# Patient Record
Sex: Male | Born: 1950
Health system: Southern US, Community
[De-identification: ages and names within clinical notes are randomized; demographics above are authoritative.]

## PROBLEM LIST (undated history)

## (undated) DIAGNOSIS — I10 Essential (primary) hypertension: Secondary | ICD-10-CM

## (undated) DIAGNOSIS — K222 Esophageal obstruction: Secondary | ICD-10-CM

## (undated) DIAGNOSIS — C4491 Basal cell carcinoma of skin, unspecified: Secondary | ICD-10-CM

## (undated) DIAGNOSIS — K635 Polyp of colon: Secondary | ICD-10-CM

## (undated) DIAGNOSIS — T7840XA Allergy, unspecified, initial encounter: Secondary | ICD-10-CM

## (undated) DIAGNOSIS — R972 Elevated prostate specific antigen [PSA]: Secondary | ICD-10-CM

## (undated) DIAGNOSIS — K5790 Diverticulosis of intestine, part unspecified, without perforation or abscess without bleeding: Secondary | ICD-10-CM

## (undated) DIAGNOSIS — R011 Cardiac murmur, unspecified: Secondary | ICD-10-CM

## (undated) DIAGNOSIS — K219 Gastro-esophageal reflux disease without esophagitis: Secondary | ICD-10-CM

## (undated) DIAGNOSIS — S92009A Unspecified fracture of unspecified calcaneus, initial encounter for closed fracture: Secondary | ICD-10-CM

## (undated) DIAGNOSIS — K648 Other hemorrhoids: Secondary | ICD-10-CM

## (undated) HISTORY — DX: Allergy, unspecified, initial encounter: T78.40XA

## (undated) HISTORY — DX: Basal cell carcinoma of skin, unspecified: C44.91

## (undated) HISTORY — PX: COLONOSCOPY: SHX174

## (undated) HISTORY — DX: Esophageal obstruction: K22.2

## (undated) HISTORY — DX: Elevated prostate specific antigen (PSA): R97.20

## (undated) HISTORY — PX: KNEE ARTHROSCOPY: SUR90

## (undated) HISTORY — PX: SHOULDER ARTHROSCOPY: SHX128

## (undated) HISTORY — DX: Gastro-esophageal reflux disease without esophagitis: K21.9

## (undated) HISTORY — DX: Cardiac murmur, unspecified: R01.1

## (undated) HISTORY — DX: Polyp of colon: K63.5

## (undated) HISTORY — DX: Essential (primary) hypertension: I10

## (undated) HISTORY — DX: Other hemorrhoids: K64.8

## (undated) HISTORY — DX: Unspecified fracture of unspecified calcaneus, initial encounter for closed fracture: S92.009A

## (undated) HISTORY — PX: LIPOMA EXCISION: SHX5283

## (undated) HISTORY — DX: Diverticulosis of intestine, part unspecified, without perforation or abscess without bleeding: K57.90

## (undated) HISTORY — PX: OTHER SURGICAL HISTORY: SHX169

---

## 1999-09-07 ENCOUNTER — Encounter: Payer: Self-pay | Admitting: Emergency Medicine

## 1999-09-07 ENCOUNTER — Emergency Department (HOSPITAL_COMMUNITY): Admission: EM | Admit: 1999-09-07 | Discharge: 1999-09-07 | Payer: Self-pay | Admitting: *Deleted

## 2000-11-23 DIAGNOSIS — D229 Melanocytic nevi, unspecified: Secondary | ICD-10-CM

## 2000-11-23 HISTORY — DX: Melanocytic nevi, unspecified: D22.9

## 2001-10-21 ENCOUNTER — Encounter: Payer: Self-pay | Admitting: General Surgery

## 2001-10-21 ENCOUNTER — Observation Stay (HOSPITAL_COMMUNITY): Admission: RE | Admit: 2001-10-21 | Discharge: 2001-10-22 | Payer: Self-pay | Admitting: General Surgery

## 2001-10-21 ENCOUNTER — Encounter (INDEPENDENT_AMBULATORY_CARE_PROVIDER_SITE_OTHER): Payer: Self-pay | Admitting: Specialist

## 2001-10-21 HISTORY — PX: CHOLECYSTECTOMY: SHX55

## 2004-08-08 ENCOUNTER — Ambulatory Visit (HOSPITAL_BASED_OUTPATIENT_CLINIC_OR_DEPARTMENT_OTHER): Admission: RE | Admit: 2004-08-08 | Discharge: 2004-08-08 | Payer: Self-pay | Admitting: Orthopedic Surgery

## 2006-06-23 HISTORY — PX: NASAL POLYP SURGERY: SHX186

## 2006-07-24 ENCOUNTER — Emergency Department (HOSPITAL_COMMUNITY): Admission: EM | Admit: 2006-07-24 | Discharge: 2006-07-24 | Payer: Self-pay | Admitting: Emergency Medicine

## 2007-11-22 DIAGNOSIS — K5790 Diverticulosis of intestine, part unspecified, without perforation or abscess without bleeding: Secondary | ICD-10-CM

## 2007-11-22 DIAGNOSIS — K648 Other hemorrhoids: Secondary | ICD-10-CM | POA: Insufficient documentation

## 2007-11-22 HISTORY — DX: Diverticulosis of intestine, part unspecified, without perforation or abscess without bleeding: K57.90

## 2007-11-22 HISTORY — DX: Other hemorrhoids: K64.8

## 2008-11-21 ENCOUNTER — Encounter: Admission: RE | Admit: 2008-11-21 | Discharge: 2008-11-21 | Payer: Self-pay | Admitting: Otolaryngology

## 2009-03-23 ENCOUNTER — Ambulatory Visit (HOSPITAL_COMMUNITY): Admission: RE | Admit: 2009-03-23 | Discharge: 2009-03-23 | Payer: Self-pay | Admitting: Otolaryngology

## 2009-06-12 ENCOUNTER — Encounter: Admission: RE | Admit: 2009-06-12 | Discharge: 2009-06-12 | Payer: Self-pay | Admitting: Otolaryngology

## 2009-10-24 DIAGNOSIS — C4491 Basal cell carcinoma of skin, unspecified: Secondary | ICD-10-CM

## 2009-10-24 HISTORY — DX: Basal cell carcinoma of skin, unspecified: C44.91

## 2010-06-23 HISTORY — PX: ESOPHAGOGASTRODUODENOSCOPY: SHX1529

## 2010-07-14 ENCOUNTER — Encounter: Payer: Self-pay | Admitting: Otolaryngology

## 2010-10-16 ENCOUNTER — Encounter (INDEPENDENT_AMBULATORY_CARE_PROVIDER_SITE_OTHER): Payer: BC Managed Care – PPO | Admitting: Family Medicine

## 2010-10-16 DIAGNOSIS — I10 Essential (primary) hypertension: Secondary | ICD-10-CM

## 2010-10-16 DIAGNOSIS — Z125 Encounter for screening for malignant neoplasm of prostate: Secondary | ICD-10-CM

## 2010-10-16 DIAGNOSIS — Z79899 Other long term (current) drug therapy: Secondary | ICD-10-CM

## 2010-10-16 DIAGNOSIS — Z0000 Encounter for general adult medical examination without abnormal findings: Secondary | ICD-10-CM

## 2010-10-18 ENCOUNTER — Encounter: Payer: Self-pay | Admitting: Family Medicine

## 2010-11-08 NOTE — Op Note (Signed)
[facility]  Patient:    Erik Leon, Erik Leon Visit Number: [id number] MRN: [id number]          Service Type: SUR Location: 1W 0160 01 Attending Physician:  Carson Myrtle Dictated by:   Sheppard Plumber Earlene Plater, M.D. Proc. Date: 10/21/01 Admit Date:  10/21/2001 Discharge Date: 10/22/2001   CC:         Griffith Citron, M.D.   Operative Report  PREOPERATIVE DIAGNOSIS:  Subacute and chronic cholecystolithiasis.  POSTOPERATIVE DIAGNOSIS:  Subacute and chronic cholecystolithiasis.  PROCEDURE:  Laparoscopic cholecystectomy and operative cholangiogram.  SURGEON:  Timothy E. Earlene Plater, M.D.  ANESTHESIA:  CRNA supervised, Dr. Seward Grater.  INDICATION:  Mr. Hoon otherwise healthy, has over the past month developed severe mid epigastric and right upper quadrant pain with radiation, nausea. He has had no change in liver function studies.  Ultrasound confirmed stones and acute and chronic changes in the gallbladder.  He was counseled and urgently scheduled for this surgery.  DESCRIPTION OF PROCEDURE:  The patient was evaluated by anesthesia, taken to the operating room, placed supine, general endotracheal anesthesia administered.  The abdomen was shaved, scrubbed, prepped, and draped in the usual fashion.  Marcaine 0.25% with epinephrine was used prior to each incision.  A horizontal infraumbilical incision made, the fascia identified, opened vertically, the peritoneum entered without complication.  The Hasson catheter sewn in place with a #1 Vicryl.  The abdomen insufflated.  General peritoneoscopy was unremarkable.  The gallbladder was thickened and densely adherent to the omentum.  It was approximately one-half intrahepatic.  A second 10 mm trocar placed in the mid epigastrium, two 5 mm trocars in the right upper quadrant.  The gallbladder was grasped and the adhesions taken down, the entire gallbladder then visualized, carefully inspected.  An anterior artery  was identified, clipped, and divided.  The cystic duct beneath that was then carefully dissected out, a clip placed near the gallbladder, small incision made in the cystic duct, a Reddick catheter inserted, clipped in place and then using real-time fluoroscopy with half-strength Hypaque, injections were made showing smooth and complete filling of the biliary tree with free flow into the duodenum.  The catheter removed, the stump of the cystic duct doubly clipped, and then fully divided.  A small posterior artery was identified and clipped as well.  The gallbladder was removed from the gallbladder bed without incident or complication.  Careful inspection of the gallbladder bed and copiously irrigation revealed no bleeding and no bile. The gallbladder was then removed through the infraumbilical incision with a small amount of spillage of bile and removal.  The gallbladder was inspected; stones were palpated, and it was passed off for pathology.  Copious irrigation was used throughout the abdomen.  When the irrigant was clear after two liters of irrigation, that was complete as well.  All irrigants, CO2, instruments, and trocars removed under direct vision.  Counts were correct.  Small skin bleeders were cauterized, and the wounds were closed with 3-0 Monocryl. Steri-Strips applied, second counts correct, dry sterile dressings applied, and he was removed to recovery room in good condition. Dictated by:   Sheppard Plumber Earlene Plater, M.D. Attending Physician:  Carson Myrtle DD:  10/21/01 TD:  10/22/01 Job: [id number]

## 2010-11-08 NOTE — Op Note (Signed)
Erik Leon, Erik Leon               ACCOUNT NO.:  [id number]   MEDICAL RECORD NO.:  [id number]          PATIENT TYPE:  AMB   LOCATION:  DSC                          FACILITY:  [facility]   PHYSICIAN:  Loreta Ave, M.D. DATE OF BIRTH:  [birth year]   DATE OF PROCEDURE:  08/08/2004  DATE OF DISCHARGE:  08/08/2004                                 OPERATIVE REPORT   PREOPERATIVE DIAGNOSIS:  Chronic impingement and distal clavicle osteolysis,  left shoulder.   POSTOPERATIVE DIAGNOSIS:  Chronic impingement and distal clavicle  osteolysis, left shoulder; with anteroinferior labrum tear.   PROCEDURE:  Left shoulder examination under anesthesia, arthroscopy and  debridement of labrum and rotator cuff.  Bursectomy, acromioplasty, CA  ligament release.  Excision of distal clavicle.   SURGEON:  Loreta Ave, M.D.   ASSISTANT:  Genene Churn. Denton Meek.   ANESTHESIA:  General.   ESTIMATED BLOOD LOSS:  Minimal.   SPECIMENS:  None.   CULTURES:  None.   COMPLICATIONS:  None.   DRESSING:  Soft compressive, with sling.   DESCRIPTION OF PROCEDURE:  The patient brought to the operating room and  placed on the operating room table in supine position.  After adequate  anesthesia had been obtained, the left shoulder examined.  Full motion  disability.  Placed in the beach chair position on a shoulder positioner.  Prepped and draped in the usual sterile fashion.   Three portals created anterior, posterior and lateral.  Shoulder with blunt  obturator, scope introduced, joint distended and inspected.  Articular  cartilage intact.  Complex flap tear.  Anteroinferior labrum debrided to a  stable surface.  Biceps tendon and biceps anchor, capsular ligament  structures, undersurface of the cuff all normal.   After debridement of the labrum, subacromial space is accessed.  Reactive  bursitis and some mild abrasion on top of the cuff.  Type 2 acromion.  Bursa  resected.  Cuff debrided.  Acromioplasty to  top of acromion and released CA  ligament.  Distal clavicle grade 4 changes with spurring.  Periarticular  spurs and lateral cm of clavicle resected.  Adequate CMD compression and  clavicle excision confirmed viewing from all portals.  Cuff thoroughly  assessed and intact.   The instruments and fluid were removed.  Portals, shoulder and bursa  injected with Marcaine.  Portals closed with 4-0 nylon.  Sterile compressive  dressing applied.  Anesthesia reversed.  Brought to recovery room.  Tolerated the surgery well with no complications.      DFM/MEDQ  D:  08/09/2004  T:  08/10/2004  Job:  [id number]

## 2011-10-11 ENCOUNTER — Other Ambulatory Visit: Payer: Self-pay | Admitting: Family Medicine

## 2011-10-14 ENCOUNTER — Telehealth: Payer: Self-pay | Admitting: Internal Medicine

## 2011-10-14 MED ORDER — LISINOPRIL 40 MG PO TABS: 40.0000 mg | ORAL_TABLET | Freq: Every day | ORAL | Status: DC

## 2011-10-14 NOTE — Telephone Encounter (Signed)
Pt is coming in for a med check and needs a 30 day supply to get him by until his appt

## 2011-10-22 ENCOUNTER — Encounter: Payer: Self-pay | Admitting: Family Medicine

## 2011-10-22 ENCOUNTER — Telehealth: Payer: Self-pay | Admitting: *Deleted

## 2011-10-22 ENCOUNTER — Ambulatory Visit (INDEPENDENT_AMBULATORY_CARE_PROVIDER_SITE_OTHER): Payer: BC Managed Care – PPO | Admitting: Family Medicine

## 2011-10-22 VITALS — BP 120/72 | HR 72 | Ht 69.5 in | Wt 212.0 lb

## 2011-10-22 DIAGNOSIS — I421 Obstructive hypertrophic cardiomyopathy: Secondary | ICD-10-CM

## 2011-10-22 DIAGNOSIS — Z125 Encounter for screening for malignant neoplasm of prostate: Secondary | ICD-10-CM

## 2011-10-22 DIAGNOSIS — Z23 Encounter for immunization: Secondary | ICD-10-CM

## 2011-10-22 DIAGNOSIS — K219 Gastro-esophageal reflux disease without esophagitis: Secondary | ICD-10-CM

## 2011-10-22 DIAGNOSIS — I10 Essential (primary) hypertension: Secondary | ICD-10-CM

## 2011-10-22 DIAGNOSIS — R011 Cardiac murmur, unspecified: Secondary | ICD-10-CM

## 2011-10-22 DIAGNOSIS — E785 Hyperlipidemia, unspecified: Secondary | ICD-10-CM

## 2011-10-22 DIAGNOSIS — R002 Palpitations: Secondary | ICD-10-CM

## 2011-10-22 MED ORDER — LISINOPRIL 40 MG PO TABS: 40.0000 mg | ORAL_TABLET | Freq: Every day | ORAL | Status: DC

## 2011-10-22 NOTE — Telephone Encounter (Signed)
Spoke with patient about the Zostavax he was supposed to get today and he told me that he is coming in next week on Tues 10/28/11 @ 9:15 for his labwork and he would like to get it then, I added it to the schedule. Just an FYI.

## 2011-10-22 NOTE — Patient Instructions (Signed)
We are going to arrange for an echocardiogram. Continue your current medications. Return for fasting labs.

## 2011-10-22 NOTE — Progress Notes (Signed)
Patient presents for med check, last seen at [facility] over a year ago.  He denies any specific complaints today.  Having some recurrent problems with esophagus, considering re-dilatation soon (sees Dr. Kinnie Scales). Not getting exercise other than walking on the job, sometimes for up to 30 minutes straight.  HTN: BP's at home run 110/72.  Denies dizziness, headaches, leg swelling, chest pain.  Updated past medical and surgical history, history completely reviewed.  He had norovirus 3 weeks ago--only had diarrhea for a short time, but vomited frequently x 2-3 days, lost 11 pounds.  No problems since.   Past Medical History  Diagnosis Date  . Allergy   . Heart murmur   . GERD (gastroesophageal reflux disease)   . Hypertension   . Colon polyp   . Internal hemorrhoid 6/09  . Diverticulosis 6/09    rare L colon diverticuli  . Hypertrophic obstructive cardiomyopathy     mild--Dr. Mayford Knife  . Migraine age 24    resolved after nasal surgery  . Esophageal stricture     s/p dilatation (Dr. Kinnie Scales)    Past Surgical History  Procedure Date  . Colonoscopy 11/2007, 2011    Dr. Laural Roes again 2014  . Shoulder arthroscopy 12/03; 1/06    right; left  . Cholecystectomy 5/03  . Nasal polyp surgery 2008  . Lipoma excision     Dr. Kateri Mc from L paramedian prevertebral space  . Esophagogastroduodenoscopy 2012    with dilatation.  Dr. Kinnie Scales    History   Social History  . Marital Status: Married    Spouse Name: N/A    Number of Children: N/A  . Years of Education: N/A   Occupational History  . CEO of many companies    Social History Main Topics  . Smoking status: Former Smoker    Quit date: 06/24/1983  . Smokeless tobacco: Not on file  . Alcohol Use: Yes     1 beer a month.  . Drug Use: No  . Sexually Active: Not on file   Other Topics Concern  . Not on file   Social History Narrative   Married with a son and a daughter (due to get re-married soon).  Adopted 3 children (ages  80-18)    Family History  Problem Relation Age of Onset  . Hypertension Mother    Current Outpatient Prescriptions on File Prior to Visit  Medication Sig Dispense Refill  . aspirin 81 MG tablet Take 81 mg by mouth daily.        Marland Kitchen esomeprazole (NEXIUM) 40 MG capsule Take 40 mg by mouth daily before breakfast.        . fexofenadine (ALLEGRA) 180 MG tablet Take 180 mg by mouth daily.       No Known Allergies  ROS: Denies headaches, dizziness, chest pain.  Occasionally feels fluttering in chest.  Can't recall details, lasts a few seconds.  Not related to exertion or activity, possibly related to caffeine.  Denies any exertional pain, shortness of breath, denies syncope, tachycardia. Occasional mild dysphagia. Denies bowel changes. No skin rashes.  Denies URI symptoms  PHYSICAL EXAM: BP 120/72  Pulse 72  Ht 5' 9.5" (1.765 m)  Wt 212 lb (96.163 kg)  BMI 30.86 kg/m2 Well developed, pleasant male in no distress Neck: no lymphadenopathy, thyromegaly or carotid bruit Heart: regular rate and rhythm.  Blowing murmur at apex, also heard at RUSB, systolic ejection Lungs: clear bilaterally Abdomen: soft, nontender, no organomegaly or mass Extremities: no clubbing, cyanosis or edema,  2+ pulses Skin: no rash Neuro: alert and oriented. Cranial nerves grossly intact.  Normal gait, strength, sensation Psych: normal mood, affect, hygiene and grooming  ASSESSMENT/PLAN: 1. Essential hypertension, benign  Comprehensive metabolic panel, lisinopril (PRINIVIL,ZESTRIL) 40 MG tablet   well controlled  2. GERD (gastroesophageal reflux disease)    3. Need for shingles vaccine     risks and benefits reviewed.  Pt willing to get without checking insurance coverage. Left prior to getting--will administer at [place] when he returns for labs  4. Dyslipidemia  Lipid panel  5. Palpitations    6. Heart murmur    7. Hypertrophic obstructive cardiomyopathy     mild, per old records, as previously diagnosed by Dr.  Mayford Knife. Recommend repeat echo. BP well controlled, and pt asymptomatic  8. Special screening for malignant neoplasm of prostate  PSA   Refer for Echo  Palpitations--check echo, per history, sounds benign. If increased frequency, consider monitor. Cut back on caffeine

## 2011-10-28 ENCOUNTER — Other Ambulatory Visit: Payer: BC Managed Care – PPO

## 2011-10-29 ENCOUNTER — Other Ambulatory Visit (INDEPENDENT_AMBULATORY_CARE_PROVIDER_SITE_OTHER): Payer: BC Managed Care – PPO

## 2011-10-29 DIAGNOSIS — Z23 Encounter for immunization: Secondary | ICD-10-CM

## 2011-10-29 DIAGNOSIS — Z2911 Encounter for prophylactic immunotherapy for respiratory syncytial virus (RSV): Secondary | ICD-10-CM

## 2011-10-29 DIAGNOSIS — E785 Hyperlipidemia, unspecified: Secondary | ICD-10-CM

## 2011-10-29 DIAGNOSIS — Z125 Encounter for screening for malignant neoplasm of prostate: Secondary | ICD-10-CM

## 2011-10-29 DIAGNOSIS — I10 Essential (primary) hypertension: Secondary | ICD-10-CM

## 2011-10-30 ENCOUNTER — Encounter: Payer: Self-pay | Admitting: Family Medicine

## 2011-10-30 LAB — LIPID PANEL
Cholesterol: 149 mg/dL (ref 0–200)
LDL Cholesterol: 91 mg/dL (ref 0–99)
VLDL: 19 mg/dL (ref 0–40)

## 2011-10-30 LAB — COMPREHENSIVE METABOLIC PANEL
ALT: 14 U/L (ref 0–53)
AST: 20 U/L (ref 0–37)
Albumin: 4.2 g/dL (ref 3.5–5.2)
Calcium: 9.5 mg/dL (ref 8.4–10.5)
Chloride: 105 mEq/L (ref 96–112)
Potassium: 5.2 mEq/L (ref 3.5–5.3)

## 2011-11-20 ENCOUNTER — Encounter: Payer: Self-pay | Admitting: Family Medicine

## 2012-06-23 DIAGNOSIS — S92009A Unspecified fracture of unspecified calcaneus, initial encounter for closed fracture: Secondary | ICD-10-CM | POA: Insufficient documentation

## 2012-06-23 HISTORY — DX: Unspecified fracture of unspecified calcaneus, initial encounter for closed fracture: S92.009A

## 2012-09-14 ENCOUNTER — Other Ambulatory Visit: Payer: Self-pay | Admitting: Dermatology

## 2012-09-14 DIAGNOSIS — C4491 Basal cell carcinoma of skin, unspecified: Secondary | ICD-10-CM

## 2012-09-14 HISTORY — DX: Basal cell carcinoma of skin, unspecified: C44.91

## 2012-09-23 ENCOUNTER — Telehealth: Payer: Self-pay | Admitting: Family Medicine

## 2012-09-23 NOTE — Telephone Encounter (Signed)
[facility] req Lisinopril 40 mg  1 po qd

## 2012-09-24 NOTE — Telephone Encounter (Signed)
And was denied

## 2012-09-24 NOTE — Telephone Encounter (Signed)
Faxed back that pt needs an appt

## 2012-10-21 DIAGNOSIS — R972 Elevated prostate specific antigen [PSA]: Secondary | ICD-10-CM

## 2012-10-21 HISTORY — DX: Elevated prostate specific antigen (PSA): R97.20

## 2012-11-01 ENCOUNTER — Other Ambulatory Visit: Payer: Self-pay | Admitting: Family Medicine

## 2012-11-01 ENCOUNTER — Ambulatory Visit (INDEPENDENT_AMBULATORY_CARE_PROVIDER_SITE_OTHER): Payer: BC Managed Care – PPO | Admitting: Family Medicine

## 2012-11-01 ENCOUNTER — Encounter: Payer: Self-pay | Admitting: Family Medicine

## 2012-11-01 VITALS — BP 130/78 | HR 68 | Ht 70.0 in | Wt 211.0 lb

## 2012-11-01 DIAGNOSIS — Z125 Encounter for screening for malignant neoplasm of prostate: Secondary | ICD-10-CM

## 2012-11-01 DIAGNOSIS — I10 Essential (primary) hypertension: Secondary | ICD-10-CM

## 2012-11-01 LAB — COMPREHENSIVE METABOLIC PANEL
ALT: 19 U/L (ref 0–53)
AST: 20 U/L (ref 0–37)
Alkaline Phosphatase: 55 U/L (ref 39–117)
CO2: 30 mEq/L (ref 19–32)
Creat: 1 mg/dL (ref 0.50–1.35)
Sodium: 141 mEq/L (ref 135–145)
Total Bilirubin: 0.6 mg/dL (ref 0.3–1.2)
Total Protein: 6.4 g/dL (ref 6.0–8.3)

## 2012-11-01 MED ORDER — LISINOPRIL 40 MG PO TABS: 40.0000 mg | ORAL_TABLET | Freq: Every day | ORAL | Status: DC

## 2012-11-01 NOTE — Progress Notes (Signed)
Chief Complaint  Patient presents with  . Hypertension    med check, nonfasting.   Patient presents for follow up on hypertension.  He was last seen 1 year ago, and denies any current problems.  He checks his BP about weekly, and has been running 115-125/75-85 when taking meds.  He has been out of his medication since Friday.  Denies headaches, dizziness, edema, side effects of medication. Denies chest pain. Walks a lot with his job, at [employer], fields.  Some shortness of breath with walking up and down hills/steep inclines.  Unchanged x years. He always feels like there is something wrong with his heart, but he has seen cardiologist and was told everything was fine. Has had normal stress tests in the past (Dr. Mayford Knife)  Past Medical History  Diagnosis Date  . Allergy   . Heart murmur   . GERD (gastroesophageal reflux disease)   . Hypertension   . Colon polyp   . Internal hemorrhoid 6/09  . Diverticulosis 6/09    rare L colon diverticuli  . Hypertrophic obstructive cardiomyopathy     mild--Dr. Mayford Knife  . Migraine age 62    resolved after nasal surgery  . Esophageal stricture     s/p dilatation (Dr. Kinnie Scales)  . Calcaneal fracture 2014    right; s/p boot   Past Surgical History  Procedure Laterality Date  . Colonoscopy  11/2007, 2011    Dr. Laural Roes again 2014  . Shoulder arthroscopy  12/03; 1/06    right; left  . Cholecystectomy  5/03  . Nasal polyp surgery  2008  . Lipoma excision      Dr. Kateri Mc from L paramedian prevertebral space  . Esophagogastroduodenoscopy  2012    with dilatation.  Dr. Kinnie Scales   History   Social History  . Marital Status: Married    Spouse Name: N/A    Number of Children: N/A  . Years of Education: N/A   Occupational History  . CEO of many companies    Social History Main Topics  . Smoking status: Former Smoker    Quit date: 06/24/1983  . Smokeless tobacco: Not on file  . Alcohol Use: Yes     Comment: 1 beer a month.  .  Drug Use: No  . Sexually Active: Not on file   Other Topics Concern  . Not on file   Social History Narrative   Recently separated from his wife.  They have a son and a daughter.  Adopted 3 children (ages 60-18)   Family History  Problem Relation Age of Onset  . Hypertension Mother   . Dementia Mother    Current outpatient prescriptions:aspirin 81 MG tablet, Take 81 mg by mouth daily.  , Disp: , Rfl: ;  esomeprazole (NEXIUM) 40 MG capsule, Take 40 mg by mouth daily before breakfast.  , Disp: , Rfl: ;  fexofenadine (ALLEGRA) 180 MG tablet, Take 180 mg by mouth daily., Disp: , Rfl: ;  lisinopril (PRINIVIL,ZESTRIL) 40 MG tablet, Take 1 tablet (40 mg total) by mouth daily., Disp: 90 tablet, Rfl: 3  No Known Allergies  ROS:  Denies fevers, URI symptoms, cough, chest pain, nausea, vomiting, dysphagia, bowel changes, bleeding/bruising, rashes, depression/anxiety, joint pains or other concerns.  Recent injury to his heel, with some ongoing occasional foot pain.  He had gained weight while in the boot, but he is back to being active, playing tennis and lost the weight.  PHYSICAL EXAM: BP 150/90  Pulse 68  Ht 5\' 10"  (  1.778 m)  Wt 211 lb (95.709 kg)  BMI 30.28 kg/m2 130 78 on repeat by MD  Well developed, pleasant male in no distress HEENT:  PERRL, conjunctiva clear Neck: no lymphadenopathy, thyromegaly or carotid bruit Heart: regular rate and rhythm without murmur Lungs: clear bilaterally Chest: nontender, no mass Abdomen: soft, nontender, no organomegaly or mass Extremities: no edema, 2+ pulse Skin: no rash Psych: normal mood, affect, hygiene and grooming Neuro: alert and oriented.  Normal strength, sensation, gait  ASSESSMENT/PLAN:  Essential hypertension, benign - Plan: lisinopril (PRINIVIL,ZESTRIL) 40 MG tablet, Comprehensive metabolic panel  Special screening for malignant neoplasm of prostate - Plan: PSA  HTN--well controlled.  A little higher than normal today due to being  out of meds x 3 days.  (nonfasting--had coffee with cream an hour ago)  Schedule CPE

## 2012-11-01 NOTE — Patient Instructions (Signed)
Check with your insurance re: zostavax coverage. We can give this shingles vaccine at your physical

## 2012-11-02 LAB — PSA, TOTAL AND FREE: PSA, Free Pct: 17 % — ABNORMAL LOW (ref >25)

## 2012-11-03 ENCOUNTER — Encounter: Payer: Self-pay | Admitting: Family Medicine

## 2012-11-03 DIAGNOSIS — R972 Elevated prostate specific antigen [PSA]: Secondary | ICD-10-CM | POA: Insufficient documentation

## 2013-03-03 ENCOUNTER — Telehealth: Payer: Self-pay | Admitting: *Deleted

## 2013-03-03 MED ORDER — AZITHROMYCIN 250 MG PO TABS: ORAL_TABLET | ORAL | Status: DC

## 2013-03-03 NOTE — Telephone Encounter (Signed)
Patient informed of Dr.Knapp's recommendations. 

## 2013-03-03 NOTE — Telephone Encounter (Signed)
Spoke with patient and he has has symptoms x 2-3 days. Running nose, cough that is deep in his chest, producing a greenish-yellow mucus. Had a fever of 100.5 last night. The only OTC medication he has been taking is tylenol. He is leaving the country tomorrow morning @ 10:45am. Would like to have medications to take with him. Uses [facility] [address]

## 2013-03-03 NOTE — Telephone Encounter (Signed)
I do NOT recommend starting antibiotics yet--this very likely is viral.  Continue with supportive measures--use tylenol and/or ibuprofen for pain and fever, sinus rinses and mucinex for chest congestion and cough, delsym syrup (vs Mucinex DM) for cough.  Okay for z-pak so he can take with him.  Not to start until fevers have persisted, or illness has persisted >5-7 days and worsening.  He may use Afrin nasal spray on the plane to help with ear pain.  Only if BP's are very well controlled should he use a decongestant.

## 2013-03-04 ENCOUNTER — Ambulatory Visit (INDEPENDENT_AMBULATORY_CARE_PROVIDER_SITE_OTHER): Payer: BC Managed Care – PPO | Admitting: Medical

## 2013-03-04 ENCOUNTER — Encounter: Payer: Self-pay | Admitting: Medical

## 2013-03-04 VITALS — BP 142/90 | HR 84 | Temp 97.7°F | Resp 18 | Wt 215.0 lb

## 2013-03-04 DIAGNOSIS — R059 Cough, unspecified: Secondary | ICD-10-CM

## 2013-03-04 DIAGNOSIS — J069 Acute upper respiratory infection, unspecified: Secondary | ICD-10-CM

## 2013-03-04 DIAGNOSIS — R509 Fever, unspecified: Secondary | ICD-10-CM

## 2013-03-04 DIAGNOSIS — R05 Cough: Secondary | ICD-10-CM

## 2013-03-04 NOTE — Progress Notes (Signed)
Subjective:  Erik Leon is a 62 y.o. male who presents for illness.  Started Tuesday not feeling good with runny nose, post nasal drip, achy.  But over last 2 days been having low grade fever, continue symptoms, and now cough, thicker productive mucous, sinus pressure.  Yesterday had fever to 102.2.  He called in here and zpak was called out.   He is on day 2 of zpak . He missed his flight today to [place] due to fever and illness.  Needs letter for work/airline.  He is currently taking mucinex DM.  No other aggravating or relieving factors.  No other c/o.  ROS as in subjective.   Objective: Filed Vitals:   03/04/13 1156  BP: 142/90  Pulse: 84  Temp: 97.7 F (36.5 C)  Resp: 18    General appearance: Alert, WD/WN, no distress, somewhat ill appearing                             Skin: warm, no rash                           Head: no sinus tenderness                            Eyes: conjunctiva normal, corneas clear, PERRLA                            Ears: pearly TMs, external ear canals normal                          Nose: septum midline, turbinates with erythema and clear discharge             Mouth/throat: MMM, tongue normal, mild pharyngeal erythema                           Neck: supple, no adenopathy, no thyromegaly, nontender                          Heart: RRR, normal S1, S2, no murmurs                         Lungs: CTA bilaterally, no wheezes, rales, or rhonchi     Assessment: Encounter Diagnoses  Name Primary?  . Cough Yes  . Fever, unspecified   . Upper respiratory infection     Plan: No obvious pneumonia, lungs seem clear.  discussed his symptoms.  Advised he finish Zpak which he has begun, c/t Mucinex DM, increase water intake, use nasal saline flush and salt water gargles, Tylenol or Ibuprofen OTC for fever and malaise.  Call/return in 2-3 days if symptoms aren't resolving.  Gave letter for plane/work due to his absence and inability to take the plane  trip.

## 2013-03-21 ENCOUNTER — Encounter: Payer: Self-pay | Admitting: Family Medicine

## 2013-03-21 ENCOUNTER — Ambulatory Visit (INDEPENDENT_AMBULATORY_CARE_PROVIDER_SITE_OTHER): Payer: BC Managed Care – PPO | Admitting: Family Medicine

## 2013-03-21 VITALS — BP 120/84 | HR 68 | Temp 98.0°F | Ht 69.5 in | Wt 213.0 lb

## 2013-03-21 DIAGNOSIS — Z0000 Encounter for general adult medical examination without abnormal findings: Secondary | ICD-10-CM

## 2013-03-21 DIAGNOSIS — I10 Essential (primary) hypertension: Secondary | ICD-10-CM

## 2013-03-21 DIAGNOSIS — K219 Gastro-esophageal reflux disease without esophagitis: Secondary | ICD-10-CM

## 2013-03-21 DIAGNOSIS — R972 Elevated prostate specific antigen [PSA]: Secondary | ICD-10-CM

## 2013-03-21 LAB — POCT URINALYSIS DIPSTICK
Bilirubin, UA: NEGATIVE
Blood, UA: NEGATIVE
Nitrite, UA: NEGATIVE
Protein, UA: NEGATIVE
Spec Grav, UA: 1.015
Urobilinogen, UA: NEGATIVE
pH, UA: 5

## 2013-03-21 NOTE — Patient Instructions (Signed)
HEALTH MAINTENANCE RECOMMENDATIONS:  It is recommended that you get at least 30 minutes of aerobic exercise at least 5 days/week (for weight loss, you may need as much as 60-90 minutes). This can be any activity that gets your heart rate up. This can be divided in 10-15 minute intervals if needed, but try and build up your endurance at least once a week.  Weight bearing exercise is also recommended twice weekly.  Eat a healthy diet with lots of vegetables, fruits and fiber.  "Colorful" foods have a lot of vitamins (ie green vegetables, tomatoes, red peppers, etc).  Limit sweet tea, regular sodas and alcoholic beverages, all of which has a lot of calories and sugar.  Up to 2 alcoholic drinks daily may be beneficial for men (unless trying to lose weight, watch sugars).  Drink a lot of water.  Sunscreen of at least SPF 30 should be used on all sun-exposed parts of the skin when outside between the hours of 10 am and 4 pm (not just when at beach or pool, but even with exercise, golf, tennis, and yard work!)  Use a sunscreen that says "broad spectrum" so it covers both UVA and UVB rays, and make sure to reapply every 1-2 hours.  Remember to change the batteries in your smoke detectors when changing your clock times in the spring and fall.  Use your seat belt every time you are in a car, and please drive safely and not be distracted with cell phones and texting while driving.  Please  Consider getting your flu shot upon return to [place], in the near future (you will still get benefit, even if you wait until December or January).

## 2013-03-21 NOTE — Progress Notes (Signed)
Chief Complaint  Patient presents with  . Annual Exam    fasting annual exam. No concerns other than the fact that he still has lingering cough since before he left country.  Pt declines flu vaccine, he doesn not take them.   Erik Leon is a 62 y.o. male who presents for a complete physical.  He has the following concerns:  Hypertension follow-up:  Blood pressures elsewhere are 115/75-120's/80's.  Denies dizziness, headaches, chest pain.  Denies side effects of medications.  Admits to a slight cough from the ACEI, but tolerable.  Very slight dizziness if he gets up too fast.  Recent URI, treated with Zpak.  He is feeling much better, but some residual cough, slightly greenish-brown throughout the day.  Seems to be getting better, coughing less today than even over the weekend.  Fevers resolved.  Abnormal PSA--he had prostate biopsy which was benign.  He is to follow up with urologist for a 6 months check. He denies any urinary symptoms, ED or other GU complaints.  Immunization History  Administered Date(s) Administered  . Tdap 10/16/2010  . Zoster 10/29/2011  refuses flu shots Last colonoscopy: 11/2007, due Last PSA: May 2014, then referred to [facility] and had biopsy in June which was benign Dentist: twice yearly Ophtho: every 2 years Exercise: plays tennis, 3-5 hours per week (doubles).  Walks a lot on his job  Past Medical History  Diagnosis Date  . Allergy   . Heart murmur   . GERD (gastroesophageal reflux disease)   . Hypertension   . Colon polyp   . Internal hemorrhoid 6/09  . Diverticulosis 6/09    rare L colon diverticuli  . Hypertrophic obstructive cardiomyopathy     mild--Dr. Mayford Knife  . Migraine age 65    resolved after nasal surgery  . Esophageal stricture     s/p dilatation (Dr. Kinnie Scales)  . Calcaneal fracture 2014    right; s/p boot  . Elevated PSA 10/2012    benign prostate biopsies 11/2012    Past Surgical History  Procedure Laterality Date  . Colonoscopy   11/2007, 2011    Dr. Laural Roes again 2014  . Shoulder arthroscopy  12/03; 1/06    right; left  . Cholecystectomy  5/03  . Nasal polyp surgery  2008  . Lipoma excision      Dr. Kateri Mc from L paramedian prevertebral space  . Esophagogastroduodenoscopy  2012    with dilatation.  Dr. Kinnie Scales    History   Social History  . Marital Status: Married    Spouse Name: N/A    Number of Children: N/A  . Years of Education: N/A   Occupational History  . CEO of many companies    Social History Main Topics  . Smoking status: Former Smoker    Quit date: 06/24/1983  . Smokeless tobacco: Never Used  . Alcohol Use: Yes     Comment: 1 scotch per week  . Drug Use: No  . Sexual Activity: Yes    Partners: Female   Other Topics Concern  . Not on file   Social History Narrative   Recently separated from his wife.  They have a son and a daughter.  Adopted 3 children (ages 1-18)    Family History  Problem Relation Age of Onset  . Hypertension Mother   . Dementia Mother   . Cancer Mother     bladder, metastatic  . Diabetes Neg Hx   . Prostate cancer Neg Hx   . Colon  cancer Neg Hx     Current outpatient prescriptions:aspirin 81 MG tablet, Take 81 mg by mouth daily.  , Disp: , Rfl: ;  esomeprazole (NEXIUM) 40 MG capsule, Take 40 mg by mouth daily before breakfast.  , Disp: , Rfl: ;  fexofenadine (ALLEGRA) 180 MG tablet, Take 180 mg by mouth daily., Disp: , Rfl: ;  lisinopril (PRINIVIL,ZESTRIL) 40 MG tablet, Take 1 tablet (40 mg total) by mouth daily., Disp: 90 tablet, Rfl: 3  No Known Allergies  ROS:  The patient denies anorexia, fever, weight changes, headaches,  vision loss, decreased hearing, ear pain, hoarseness, chest pain, palpitations, dizziness, syncope, dyspnea on exertion, cough, swelling, nausea, vomiting, diarrhea, constipation, abdominal pain, melena, hematochezia, indigestion/heartburn, hematuria, incontinence, erectile dysfunction, nocturia, weakened urine stream,  dysuria, genital lesions, joint pains, numbness, tingling, weakness, tremor, suspicious skin lesions, depression, anxiety, abnormal bleeding/bruising, or enlarged lymph nodes. Intermittent dysphagia, has to chew food well, and be careful. Sees derm (Dr. Jorja Loa)  regularly (precancers) Some tennis elbow bothering him in the last month. He reports that ridging of fingernails is better since trip to [place] where he drank more juice  PHYSICAL EXAM: BP 120/84  Pulse 68  Temp(Src) 98 F (36.7 C)  Ht 5' 9.5" (1.765 m)  Wt 213 lb (96.616 kg)  BMI 31.01 kg/m2  General Appearance:    Alert, cooperative, no distress, appears stated age  Head:    Normocephalic, without obvious abnormality, atraumatic  Eyes:    PERRL, conjunctiva/corneas clear, EOM's intact, fundi    benign  Ears:    Normal TM's and external ear canals  Nose:   Nares normal, mucosa normal, no drainage or sinus   tenderness  Throat:   Lips, mucosa, and tongue normal; teeth and gums normal  Neck:   Supple, no lymphadenopathy;  thyroid:  no   enlargement/tenderness/nodules; no carotid   bruit or JVD  Back:    Spine nontender, no curvature, ROM normal, no CVA     tenderness  Lungs:     Clear to auscultation bilaterally without wheezes, rales or     ronchi; respirations unlabored  Chest Wall:    No tenderness or deformity   Heart:    Regular rate and rhythm, S1 and S2 normal, no murmur, rub   or gallop  Breast Exam:    No chest wall tenderness, masses or gynecomastia  Abdomen:     Soft, non-tender, nondistended, normoactive bowel sounds,    no masses, no hepatosplenomegaly  Genitalia:    Deferred to urologist.  Rectal:    Deferred to urologist.  Extremities:   No clubbing, cyanosis or edema  Pulses:   2+ and symmetric all extremities  Skin:   Skin color, texture, turgor normal, no rashes or lesions.  Some white discoloration of 3rd toenails bilaterally. Left 3rd toe is smaller than other toes (he gives h/o fracture). Nails aren't  thickened, no ingrown nail, nontender.  Bruising of L great toe from recent injury.  Lymph nodes:   Cervical, supraclavicular, and axillary nodes normal  Neurologic:   CNII-XII intact, normal strength, sensation and gait; reflexes 2+ and symmetric throughout          Psych:   Normal mood, affect, hygiene and grooming.    ASSESSMENT/PLAN:  Routine general medical examination at a health care facility - Plan: Visual acuity screening, POCT Urinalysis Dipstick  GERD (gastroesophageal reflux disease) - followed by Dr. Kinnie Scales; mild dysphagia  Essential hypertension, benign - well controlled  Elevated PSA - follow  up with urology as scheduled   Discussed PSA screening (risks/benefits), of which he is already aware. Recommended at least 30 minutes of aerobic activity at least 5 days/week; proper sunscreen use reviewed; healthy diet and alcohol recommendations (less than or equal to 2 drinks/day) reviewed; regular seatbelt use; changing batteries in smoke detectors. Self-testicular exams. Immunization recommendations discussed. He declines flu shot, may consider in November. Colonoscopy recommendations reviewed--due now

## 2013-04-28 ENCOUNTER — Other Ambulatory Visit: Payer: Self-pay

## 2013-06-02 ENCOUNTER — Telehealth: Payer: Self-pay | Admitting: Internal Medicine

## 2013-06-02 NOTE — Telephone Encounter (Signed)
Faxed over medical records to [facility] @ [phone]

## 2013-06-23 HISTORY — PX: ELBOW SURGERY: SHX618

## 2013-08-02 ENCOUNTER — Encounter: Payer: Self-pay | Admitting: Medical

## 2013-08-02 ENCOUNTER — Ambulatory Visit (INDEPENDENT_AMBULATORY_CARE_PROVIDER_SITE_OTHER): Payer: BC Managed Care – PPO | Admitting: Medical

## 2013-08-02 VITALS — BP 138/80 | HR 82 | Temp 98.1°F | Resp 18 | Wt 222.0 lb

## 2013-08-02 DIAGNOSIS — J209 Acute bronchitis, unspecified: Secondary | ICD-10-CM

## 2013-08-02 DIAGNOSIS — Z832 Family history of diseases of the blood and blood-forming organs and certain disorders involving the immune mechanism: Secondary | ICD-10-CM

## 2013-08-02 MED ORDER — AZITHROMYCIN 250 MG PO TABS: ORAL_TABLET | ORAL | Status: DC

## 2013-08-02 NOTE — Progress Notes (Signed)
   Subjective:   Erik Leon is a 63 y.o. male presenting on 08/02/2013 with Cough  He reports deep cough, coughing keeping him up, having hard time sleeping due to cough x 4-5 days.  He notes some head congestion, mostly chest congestion and symptoms, coughing up phlegm, some SOB after coughing spells, sore throat.  Denies hemoptysis, no fever, no NVD, no ear pain.  Has had sick contacts.  He also recently had right elbow surgery.   He is a nonsmoker.   He had pneumonia in September.  Using nothing for symptoms No other aggravating or relieving factors.  He denies chest pain, palpitations, leg edema, dyspnea on exertion, no recent weight gain. No other complaint.  He wanted to let us know that he found out recently that brother and nephew have been diagnosed with factor V Leiden.   Review of Systems ROS as in subjective      Objective:   Filed Vitals:   08/02/13 1352  BP: 138/80  Pulse: 82  Temp: 98.1 F (36.7 C)  Resp: 18    General appearance: alert, no distress, WD/WN, seated with arm sling on right arm HEENT: normocephalic, sclerae anicteric, TMs pearly, nares with turbinate edema and clear discharge, pharynx normal Oral cavity: MMM, no lesions Neck: supple, no lymphadenopathy, no thyromegaly, no masses, no JVD Lungs: somewhat bronchial sounding, no wheezes, rhonchi, or rales Pulses: 2+ symmetric, upper and lower extremities, normal cap refill Ext: no edema     Assessment: Encounter Diagnoses  Name Primary?  . Acute bronchitis Yes  . Family history of factor V Leiden mutation     Plan:  we will treat him for bronchitis, worsening respiratory infection.  He has pain medication left from his recent surgery, so advised to use that for worse cough as needed. Instructions given as below.  Advised the next time he is and we can check some labs given family history of factor V Leiden.  Patient Instructions  Bronchitis  Begin Zpak antibiotic  Rest  Hydrate well  with water  Use your pain medication every 6 hours as needed for cough  Begin OTC Mucinex DM for cough/congestion  If not improving or worse in the next 4-5 days, then call back      Return if symptoms worsen or fail to improve.

## 2013-08-02 NOTE — Patient Instructions (Signed)
Bronchitis  Begin Zpak antibiotic  Rest  Hydrate well with water  Use your pain medication every 6 hours as needed for cough  Begin OTC Mucinex DM for cough/congestion  If not improving or worse in the next 4-5 days, then call back

## 2013-11-03 ENCOUNTER — Other Ambulatory Visit: Payer: Self-pay | Admitting: Family Medicine

## 2014-02-16 ENCOUNTER — Other Ambulatory Visit: Payer: Self-pay | Admitting: Family Medicine

## 2014-04-20 ENCOUNTER — Ambulatory Visit (INDEPENDENT_AMBULATORY_CARE_PROVIDER_SITE_OTHER): Payer: BC Managed Care – PPO | Admitting: Family Medicine

## 2014-04-20 ENCOUNTER — Encounter: Payer: Self-pay | Admitting: Family Medicine

## 2014-04-20 VITALS — BP 138/86 | HR 76 | Ht 69.0 in | Wt 224.0 lb

## 2014-04-20 DIAGNOSIS — I10 Essential (primary) hypertension: Secondary | ICD-10-CM

## 2014-04-20 DIAGNOSIS — Z86010 Personal history of colonic polyps: Secondary | ICD-10-CM

## 2014-04-20 DIAGNOSIS — K219 Gastro-esophageal reflux disease without esophagitis: Secondary | ICD-10-CM

## 2014-04-20 DIAGNOSIS — Z0000 Encounter for general adult medical examination without abnormal findings: Secondary | ICD-10-CM

## 2014-04-20 DIAGNOSIS — E669 Obesity, unspecified: Secondary | ICD-10-CM

## 2014-04-20 DIAGNOSIS — Z5181 Encounter for therapeutic drug level monitoring: Secondary | ICD-10-CM

## 2014-04-20 LAB — POCT URINALYSIS DIPSTICK
Bilirubin, UA: NEGATIVE
Blood, UA: NEGATIVE
Glucose, UA: NEGATIVE
Ketones, UA: NEGATIVE
Leukocytes, UA: NEGATIVE
NITRITE UA: NEGATIVE
Protein, UA: NEGATIVE
Spec Grav, UA: 1.015
UROBILINOGEN UA: NEGATIVE
pH, UA: 6

## 2014-04-20 LAB — CBC WITH DIFFERENTIAL/PLATELET
BASOS PCT: 1 % (ref 0–1)
Basophils Absolute: 0.1 10*3/uL (ref 0.0–0.1)
Eosinophils Absolute: 0.2 10*3/uL (ref 0.0–0.7)
Eosinophils Relative: 4 % (ref 0–5)
HEMATOCRIT: 41.1 % (ref 39.0–52.0)
Hemoglobin: 13.9 g/dL (ref 13.0–17.0)
LYMPHS PCT: 30 % (ref 12–46)
Lymphs Abs: 1.9 10*3/uL (ref 0.7–4.0)
MCH: 27.1 pg (ref 26.0–34.0)
MCHC: 33.8 g/dL (ref 30.0–36.0)
MCV: 80.1 fL (ref 78.0–100.0)
MONO ABS: 0.6 10*3/uL (ref 0.1–1.0)
Monocytes Relative: 9 % (ref 3–12)
NEUTROS ABS: 3.5 10*3/uL (ref 1.7–7.7)
Neutrophils Relative %: 56 % (ref 43–77)
Platelets: 207 10*3/uL (ref 150–400)
RBC: 5.13 MIL/uL (ref 4.22–5.81)
RDW: 14 % (ref 11.5–15.5)
WBC: 6.2 10*3/uL (ref 4.0–10.5)

## 2014-04-20 MED ORDER — LISINOPRIL 40 MG PO TABS: ORAL_TABLET | ORAL | Status: DC

## 2014-04-20 NOTE — Progress Notes (Signed)
Chief Complaint  Patient presents with  . Annual Exam    annual fasting exam. No concerns. Patient declined flu vaccine today.    Erik Leon is a 63 y.o. male who presents for a complete physical.  He has the following concerns:  Hypertension follow-up: Blood pressures are checked once a week, and run 110-125/75-86. Denies dizziness, headaches, chest pain. Denies side effects of medications. Admits to a slight cough/throat-clearing from the ACEI, but tolerable.   Abnormal PSA--he had prostate biopsy which was benign. He sees urologist. He was last seen in June.  He plans to follow up in December, and then will be seen yearly if okay. He denies any urinary symptoms, ED or other GU complaints.  GERD:  Takes Nexium daily.  He has symptoms if he eats late at night, resolved by elevating the head of his bed.  No recurrent dysphagia.  Immunization History  Administered Date(s) Administered  . Tdap 10/16/2010  . Zoster 10/29/2011   refuses flu shots  Last colonoscopy: last was 2011, due last year, but Dr. Earlean Shawl had been ill.  He is planning for 05/2014 Last PSA: 3.76 in 11/2013 at urologist Dentist: twice yearly  Ophtho: every 2 years, last 2 years ago Exercise: Unable to play tennis since elbow surgery in January. He just resumed exercise.  (he used to play tennis, 3-5 hours per week (doubles)). Walks a lot on his job   Past Medical History  Diagnosis Date  . Allergy   . Heart murmur   . GERD (gastroesophageal reflux disease)   . Hypertension   . Colon polyp   . Internal hemorrhoid 6/09  . Diverticulosis 6/09    rare L colon diverticuli  . Hypertrophic obstructive cardiomyopathy     mild--Dr. Radford Pax (previously)  . Migraine age 77    resolved after nasal surgery  . Esophageal stricture     s/p dilatation (Dr. Earlean Shawl)  . Calcaneal fracture 2014    right; s/p boot  . Elevated PSA 10/2012    benign prostate biopsies 11/2012    Past Surgical History  Procedure Laterality Date   . Colonoscopy  11/2007, 2011    Dr. Rudi Rummage again 2014  . Shoulder arthroscopy  12/03; 1/06    right; left  . Cholecystectomy  5/03  . Nasal polyp surgery  2008  . Lipoma excision      Dr. Stanford Breed from [facility] prevertebral space  . Esophagogastroduodenoscopy  2012    with dilatation.  Dr. Earlean Shawl  . Elbow surgery Right 06/2013    bone spur; Dr. Percell Miller    History   Social History  . Marital Status: Married    Spouse Name: N/A    Number of Children: N/A  . Years of Education: N/A   Occupational History  . CEO of many companies    Social History Main Topics  . Smoking status: Former Smoker    Quit date: 06/24/1983  . Smokeless tobacco: Never Used  . Alcohol Use: Yes     Comment: 1 scotch per week or less  . Drug Use: No  . Sexual Activity: Yes    Partners: Female   Other Topics Concern  . Not on file   Social History Narrative   Separated from his wife.  They have a son and a daughter.  Adopted 3 children. Expecting a granddaughter 06/2014    Family History  Problem Relation Age of Onset  . Hypertension Mother   . Dementia Mother   . Cancer  Mother     bladder, metastatic  . Diabetes Neg Hx   . Prostate cancer Neg Hx   . Colon cancer Neg Hx     Outpatient Encounter Prescriptions as of 04/20/2014  Medication Sig  . aspirin 81 MG tablet Take 81 mg by mouth daily.    Marland Kitchen esomeprazole (NEXIUM) 40 MG capsule Take 40 mg by mouth daily before breakfast.    . fexofenadine (ALLEGRA) 180 MG tablet Take 180 mg by mouth daily.  Marland Kitchen lisinopril (PRINIVIL,ZESTRIL) 40 MG tablet TAKE 1 TABLET BY MOUTH DAILY  . [DISCONTINUED] lisinopril (PRINIVIL,ZESTRIL) 40 MG tablet TAKE 1 TABLET BY MOUTH DAILY  . [DISCONTINUED] azithromycin (ZITHROMAX) 250 MG tablet 2 tablets day 1, then 1 tablet days 2-4  . [DISCONTINUED] lisinopril (PRINIVIL,ZESTRIL) 40 MG tablet TAKE 1 TABLET BY MOUTH DAILY    No Known Allergies  ROS: The patient denies anorexia, fever, headaches, vision  loss, decreased hearing, ear pain, hoarseness, chest pain, palpitations, dizziness, syncope, dyspnea on exertion, cough, swelling, nausea, vomiting, diarrhea, constipation, abdominal pain, melena, hematochezia, hematuria, incontinence, erectile dysfunction, nocturia, weakened urine stream, dysuria, genital lesions, joint pains, numbness, tingling, weakness, tremor, suspicious skin lesions, depression, anxiety, abnormal bleeding/bruising, or enlarged lymph nodes.  Sees derm (Dr. Denna Haggard) regularly (precancers)  He reports that ridging of fingernails is better since trip to [place] where he drank more juice. He juices his own fruit at home, and nails have remained good. +11 pound weight gain since physical last year--suspects this is related to changes in diet (ice cream) and less exercise related to inability to play tennis since elbow surgery. Right elbow still has some discomfort (s/p surgery).  Occasional pain in his left knee (prior injury).   PHYSICAL EXAM: BP 138/86  Pulse 76  Ht 5\' 9"  (1.753 m)  Wt 224 lb (101.606 kg)  BMI 33.06 kg/m2 (he hasn't taken meds yet) General Appearance:  Alert, cooperative, no distress, appears stated age   Head:  Normocephalic, without obvious abnormality, atraumatic   Eyes:  PERRL, conjunctiva/corneas clear, EOM's intact, fundi  benign   Ears:  Normal TM's and external ear canals   Nose:  Nares normal, mucosa normal, no drainage or sinus tenderness   Throat:  Lips, mucosa, and tongue normal; teeth and gums normal   Neck:  Supple, no lymphadenopathy; thyroid: no enlargement/tenderness/nodules; no carotid  bruit or JVD   Back:  Spine nontender, no curvature, ROM normal, no CVA tenderness   Lungs:  Clear to auscultation bilaterally without wheezes, rales or ronchi; respirations unlabored   Chest Wall:  No tenderness or deformity   Heart:  Regular rate and rhythm, S1 and S2 normal, no murmur, rub  or gallop   Breast Exam:  No chest wall tenderness, masses  or gynecomastia   Abdomen:  Soft, non-tender, nondistended, normoactive bowel sounds,  no masses, no hepatosplenomegaly   Genitalia:  Deferred to urologist.   Rectal:  Deferred to urologist.   Extremities:  No clubbing, cyanosis or edema   Pulses:  2+ and symmetric all extremities   Skin:  Skin color, texture, turgor normal, no rashes or lesions noted, but patient declined to get undressed, so visualized skin was limited (sees derm regularly)  Lymph nodes:  Cervical, supraclavicular, and axillary nodes normal   Neurologic:  CNII-XII intact, normal strength, sensation and gait; reflexes 2+ and symmetric throughout          Psych: Normal mood, affect, hygiene and grooming.    ASSESSMENT/PLAN:  Annual physical exam - Plan:  POCT Urinalysis Dipstick, Visual acuity screening, Lipid panel, Comprehensive metabolic panel, CBC with Differential, TSH  Essential hypertension, benign - controlled - Plan: Lipid panel, Comprehensive metabolic panel, CBC with Differential, lisinopril (PRINIVIL,ZESTRIL) 40 MG tablet  Medication monitoring encounter - Plan: Comprehensive metabolic panel, CBC with Differential  History of colonic polyps - past due for colonoscopy--planning for December with Dr. Earlean Shawl  Obesity (BMI 30-39.9) - counseled re: weight loss  Gastroesophageal reflux disease, esophagitis presence not specified - overall controlled. no recurrent dysphagia/stricture  Recommended at least 30 minutes of aerobic activity at least 5 days/week; proper sunscreen use reviewed; healthy diet and alcohol recommendations (less than or equal to 2 drinks/day) reviewed; regular seatbelt use; changing batteries in smoke detectors. Immunization recommendations discussed. He declines flu shot. Discussed Prevnar/pneumovax recommendations after age 53. Colonoscopy recommendations reviewed--due now

## 2014-04-20 NOTE — Patient Instructions (Signed)

## 2014-04-21 LAB — COMPREHENSIVE METABOLIC PANEL
ALBUMIN: 4.4 g/dL (ref 3.5–5.2)
ALT: 23 U/L (ref 0–53)
AST: 20 U/L (ref 0–37)
Alkaline Phosphatase: 65 U/L (ref 39–117)
BUN: 17 mg/dL (ref 6–23)
CALCIUM: 8.9 mg/dL (ref 8.4–10.5)
CHLORIDE: 103 meq/L (ref 96–112)
CO2: 28 meq/L (ref 19–32)
CREATININE: 1.06 mg/dL (ref 0.50–1.35)
Glucose, Bld: 91 mg/dL (ref 70–99)
POTASSIUM: 4.7 meq/L (ref 3.5–5.3)
Sodium: 140 mEq/L (ref 135–145)
Total Bilirubin: 0.9 mg/dL (ref 0.2–1.2)
Total Protein: 6.6 g/dL (ref 6.0–8.3)

## 2014-04-21 LAB — LIPID PANEL
CHOLESTEROL: 155 mg/dL (ref 0–200)
HDL: 41 mg/dL (ref >39)
LDL Cholesterol: 95 mg/dL (ref 0–99)
Total CHOL/HDL Ratio: 3.8 Ratio
Triglycerides: 95 mg/dL (ref <150)
VLDL: 19 mg/dL (ref 0–40)

## 2014-04-21 LAB — TSH: TSH: 0.695 u[IU]/mL (ref 0.350–4.500)

## 2014-06-29 ENCOUNTER — Encounter: Payer: Self-pay | Admitting: Family Medicine

## 2015-04-26 ENCOUNTER — Encounter: Payer: Self-pay | Admitting: Family Medicine

## 2015-04-26 DIAGNOSIS — Z0000 Encounter for general adult medical examination without abnormal findings: Secondary | ICD-10-CM

## 2015-04-30 ENCOUNTER — Encounter: Payer: Self-pay | Admitting: Family Medicine

## 2015-05-25 ENCOUNTER — Other Ambulatory Visit: Payer: Self-pay | Admitting: Family Medicine

## 2015-05-25 NOTE — Telephone Encounter (Signed)
Patient was given a letter about no showing his cpe and to call and reschedule appt. Will give 30 days

## 2015-06-23 ENCOUNTER — Telehealth: Payer: Self-pay | Admitting: Family Medicine

## 2015-06-23 MED ORDER — AZITHROMYCIN 250 MG PO TABS: ORAL_TABLET | ORAL | Status: DC

## 2015-06-23 NOTE — Telephone Encounter (Signed)
Patient texted me a photo of his face, with a note stating that he has thick green mucus from his nose, green phlegm when he coughs, low grade fevers, lethargy, itchy eyes, film over eyes, gritty eyes.  Symptoms ongoing x 6 days.  Wondering if it could be sinus infection, requesting z-pak.  Picture shows some swelling and redness (?flaking) below both eyes, eyes appear watery.  Advised pt this may be a viral syndrome, possible allergic reaction at his eyes, possible sinus infection if fevers and green phlegm hasn't started to improve by this point, with persistent fevers.  He will try Zpak, and be seen in the office next week if not improving.

## 2015-06-27 DIAGNOSIS — S139XXA Sprain of joints and ligaments of unspecified parts of neck, initial encounter: Secondary | ICD-10-CM | POA: Diagnosis not present

## 2015-06-27 DIAGNOSIS — S335XXA Sprain of ligaments of lumbar spine, initial encounter: Secondary | ICD-10-CM | POA: Diagnosis not present

## 2015-06-27 DIAGNOSIS — S233XXA Sprain of ligaments of thoracic spine, initial encounter: Secondary | ICD-10-CM | POA: Diagnosis not present

## 2015-07-19 ENCOUNTER — Encounter: Payer: Self-pay | Admitting: Family Medicine

## 2015-07-19 ENCOUNTER — Ambulatory Visit (INDEPENDENT_AMBULATORY_CARE_PROVIDER_SITE_OTHER): Payer: Medicare Other | Admitting: Family Medicine

## 2015-07-19 VITALS — BP 122/70 | HR 80 | Ht 69.0 in | Wt 225.6 lb

## 2015-07-19 DIAGNOSIS — R972 Elevated prostate specific antigen [PSA]: Secondary | ICD-10-CM | POA: Diagnosis not present

## 2015-07-19 DIAGNOSIS — K219 Gastro-esophageal reflux disease without esophagitis: Secondary | ICD-10-CM | POA: Diagnosis not present

## 2015-07-19 DIAGNOSIS — I10 Essential (primary) hypertension: Secondary | ICD-10-CM | POA: Diagnosis not present

## 2015-07-19 DIAGNOSIS — R011 Cardiac murmur, unspecified: Secondary | ICD-10-CM | POA: Diagnosis not present

## 2015-07-19 LAB — COMPREHENSIVE METABOLIC PANEL
ALBUMIN: 4.1 g/dL (ref 3.6–5.1)
ALT: 16 U/L (ref 9–46)
AST: 17 U/L (ref 10–35)
Alkaline Phosphatase: 62 U/L (ref 40–115)
BUN: 16 mg/dL (ref 7–25)
CHLORIDE: 103 mmol/L (ref 98–110)
CO2: 30 mmol/L (ref 20–31)
Calcium: 9 mg/dL (ref 8.6–10.3)
Creat: 1.12 mg/dL (ref 0.70–1.25)
Glucose, Bld: 103 mg/dL — ABNORMAL HIGH (ref 65–99)
Potassium: 4.4 mmol/L (ref 3.5–5.3)
Sodium: 140 mmol/L (ref 135–146)
Total Bilirubin: 0.6 mg/dL (ref 0.2–1.2)
Total Protein: 6.6 g/dL (ref 6.1–8.1)

## 2015-07-19 MED ORDER — LISINOPRIL 40 MG PO TABS: 40.0000 mg | ORAL_TABLET | Freq: Every day | ORAL | Status: DC

## 2015-07-19 NOTE — Progress Notes (Signed)
Chief Complaint  Patient presents with  . Hypertension    nonfasting med check. Zpak was called in 06/23/15 and he is better but still has lingering cough and chest heaviness that worsens aroud 3:30pm.     Hypertension follow-up: He ran out of blood pressure medications 1 week ago. He didn't call for refill because he knew he would then call to cancel this appointment. Blood pressures are checked every other day and run 110/70's.  This morning was 125/80, but hasn't seen it much higher even while being out of medication. Denies dizziness, headaches, chest pain. He has some discomfort in the chest around 3:30-4pm after coughing spells, as a residual from a sinus infection he had earlier this month. Cough starts lower in the chest. Denies side effects of medications. Admits to a slight cough/throat-clearing from the ACEI, but tolerable.   He is complaining of tingling sensations in his chest "underneath his heart".  This seems to occur randomly, lasting just moments.  Not related to activity, exertion, stress, eating.  No associated dyspnea, palpitations or other symptoms.  GERD: Takes Nexium daily.He doesn't eat after 6, and is awake for at least 6 hours before laying down, so no further problems with nighttime reflux. If he misses a pill, he gets recurrent reflux symptoms into the chest, and up into the throat at times. No recurrent dysphagia, as long as food it cut small.  Sometimes will have problems eating a sub (thick bread).  Recent sinus infection.  See phone message from 12/31. He took Mucinex-D when on the antibiotics earlier this month.  Hasn't taken anything lately for the cough.  H/o elevated PSA.  He is requesting PSA to be checked today.  He had been under the care of urology and had benign biopsies.  Last note I have is from 11/2013.  He denies any urologic problems.  He reports that his brother's PSA has also been elevated and benign.  Past Medical History  Diagnosis Date  .  Allergy   . Heart murmur   . GERD (gastroesophageal reflux disease)   . Hypertension   . Colon polyp   . Internal hemorrhoid 6/09  . Diverticulosis 6/09    rare L colon diverticuli  . Hypertrophic obstructive cardiomyopathy     mild--Dr. Radford Pax (previously)  . Migraine age 65    resolved after nasal surgery  . Esophageal stricture     s/p dilatation (Dr. Earlean Shawl)  . Calcaneal fracture 2014    right; s/p boot  . Elevated PSA 10/2012    benign prostate biopsies 11/2012   Past Surgical History  Procedure Laterality Date  . Colonoscopy  11/2007, 2011, 2014    Dr. Rudi Rummage again 2019  . Shoulder arthroscopy  12/03; 1/06    right; left  . Cholecystectomy  5/03  . Nasal polyp surgery  2008  . Lipoma excision      Dr. Stanford Breed from L paramedian prevertebral space  . Esophagogastroduodenoscopy  2012    with dilatation.  Dr. Earlean Shawl  . Elbow surgery Right 06/2013    bone spur; Dr. Percell Miller   Social History   Social History  . Marital Status: Married    Spouse Name: N/A  . Number of Children: N/A  . Years of Education: N/A   Occupational History  . CEO of many companies    Social History Main Topics  . Smoking status: Former Smoker    Quit date: 06/24/1983  . Smokeless tobacco: Never Used  . Alcohol Use: Yes  Comment: 1 scotch per week or less  . Drug Use: No  . Sexual Activity:    Partners: Female   Other Topics Concern  . Not on file   Social History Narrative   Divorced. 2 biologic children, a son and a daughter.  Adopted 3 children. 1 granddaughter Tanja Port, [place], born 06/2014)   Outpatient Encounter Prescriptions as of 07/19/2015  Medication Sig Note  . aspirin 81 MG tablet Take 81 mg by mouth daily.     Marland Kitchen esomeprazole (NEXIUM) 40 MG capsule Take 40 mg by mouth daily before breakfast.   07/19/2015: He is actually taking 2 of the OTC Nexium (getting from [place])  . fexofenadine (ALLEGRA) 180 MG tablet Take 180 mg by mouth daily.   Marland Kitchen lisinopril  (PRINIVIL,ZESTRIL) 40 MG tablet Take 1 tablet (40 mg total) by mouth daily.   . [DISCONTINUED] lisinopril (PRINIVIL,ZESTRIL) 40 MG tablet TAKE 1 TABLET BY MOUTH EVERY DAY   . [DISCONTINUED] azithromycin (ZITHROMAX) 250 MG tablet Take 2 tablets by mouth on first day, then 1 tablet by mouth on days 2 through 5    No facility-administered encounter medications on file as of 07/19/2015.   No Known Allergies  ROS: Denies syncope, dizziness.  Slightly lightheaded with exertion at times. Denies fever, chills, headaches, nausea, vomiting, abdominal pain, bowel changes, urinary complaints, bleeding, bruising, rash.  See HPI  PHYSICAL EXAM: BP 122/70 mmHg  Pulse 80  Ht _0  (1.753 m)  Wt 225 lb 9.6 oz (102.331 kg)  BMI 33.30 kg/m2  Well-appearing, pleasant male in no distress HEENT: PERRL, EOMI, conjunctiva and sclera are clear.  TM's and EAC's normal. Nasal mucosa is mildly edematous with clear-white mucus. Sinuses are nontender. OP is clear Neck: no lymphadenopathy, thyromegaly or mass. No bruit Heart: regular rate and rhythm.  There is a blowing/musical murmur heard at apex and RUSB.  Lungs: clear bilaterally. No wheezes, rales ,ronchi or cough with forced expiration. Abdomen: soft, nontender, no organomegaly or mass Extremities: no edema Skin: normal turgor, no visible lesions Psych: normal mood, affect, hygiene and grooming.  Slightly anxious Neuro: alert and oriented. Cranial nerves intact. Normal strength, gait  ASSESSMENT/PLAN:  Heart murmur - h/o hypertrophic obstructive cardiomyopathy (mild). Prev recommended f/u echo, never done by pt. Echo again ordered. - Plan: Echocardiogram  Essential hypertension, benign - well controlled - Plan: Comprehensive metabolic panel, lisinopril (PRINIVIL,ZESTRIL) 40 MG tablet  Gastroesophageal reflux disease, esophagitis presence not specified - controlled  Elevated PSA - recheck today.  May need to f/u with urologist if higher than previously -  Plan: PSA, total and free   c-met (nonfasting). PSA reflex to free if >4    I believe your residual cough is related to postnasal drainage and some chest congestion. Start taking Mucinex (plain, 600-1236m twice daily of the 12 hour version) along with continuing your daily Allegra. Consider using short-acting sudafed if needed for any signficant nasal congestion or postnasal drainage (may raise the blood pressure).

## 2015-07-19 NOTE — Patient Instructions (Addendum)
Continue your current blood pressure medication and reflux medications.   I believe your residual cough is related to postnasal drainage and some chest congestion. Start taking Mucinex (plain, 600-1200mg  twice daily of the 12 hour version) along with continuing your daily Allegra. Consider using short-acting sudafed if needed for any signficant nasal congestion or postnasal drainage (may raise the blood pressure).  Please let us know if you have persistent or worsening cough.  We are going to be setting you up for an echocardiogram to look at the heart.  It has been a while, and the murmur seems louder.

## 2015-07-20 ENCOUNTER — Encounter: Payer: Self-pay | Admitting: Family Medicine

## 2015-07-20 LAB — PSA, TOTAL AND FREE
PSA FREE: 0.84 ng/mL
PSA, Free Pct: 13 % — ABNORMAL LOW (ref >25)
PSA: 6.59 ng/mL — ABNORMAL HIGH (ref <=4.00)

## 2015-07-27 DIAGNOSIS — Z0000 Encounter for general adult medical examination without abnormal findings: Secondary | ICD-10-CM | POA: Diagnosis not present

## 2015-07-27 DIAGNOSIS — R972 Elevated prostate specific antigen [PSA]: Secondary | ICD-10-CM | POA: Diagnosis not present

## 2015-07-30 DIAGNOSIS — S233XXD Sprain of ligaments of thoracic spine, subsequent encounter: Secondary | ICD-10-CM | POA: Diagnosis not present

## 2015-07-30 DIAGNOSIS — S139XXD Sprain of joints and ligaments of unspecified parts of neck, subsequent encounter: Secondary | ICD-10-CM | POA: Diagnosis not present

## 2015-07-30 DIAGNOSIS — S335XXD Sprain of ligaments of lumbar spine, subsequent encounter: Secondary | ICD-10-CM | POA: Diagnosis not present

## 2015-10-16 DIAGNOSIS — R972 Elevated prostate specific antigen [PSA]: Secondary | ICD-10-CM | POA: Diagnosis not present

## 2015-10-23 DIAGNOSIS — Z0000 Encounter for general adult medical examination without abnormal findings: Secondary | ICD-10-CM | POA: Diagnosis not present

## 2015-10-23 DIAGNOSIS — R972 Elevated prostate specific antigen [PSA]: Secondary | ICD-10-CM | POA: Diagnosis not present

## 2016-02-18 DIAGNOSIS — D239 Other benign neoplasm of skin, unspecified: Secondary | ICD-10-CM | POA: Diagnosis not present

## 2016-02-18 DIAGNOSIS — D485 Neoplasm of uncertain behavior of skin: Secondary | ICD-10-CM | POA: Diagnosis not present

## 2016-02-18 DIAGNOSIS — L820 Inflamed seborrheic keratosis: Secondary | ICD-10-CM | POA: Diagnosis not present

## 2016-02-18 DIAGNOSIS — L570 Actinic keratosis: Secondary | ICD-10-CM | POA: Diagnosis not present

## 2016-02-20 NOTE — Progress Notes (Signed)
Chief Complaint  Patient presents with  . Medicare Wellness    fasting med check plus/AWV. Has been having some chest pressure off and on over the last few months.   . Flu Vaccine    declined.    Erik Leon is a 65 y.o. male who presents for annual wellness visit and follow-up on chronic medical conditions.  He has the following concerns:  Hypertension follow-up: Blood pressures are checked every other day and run 110-120/70's after taking medication (hasn't taken med yet today).  Denies dizziness, headaches.  He reports a chest pressure in the mid-chest for the last 3 months.  Not related to exercise or exertion.  It can occur any time of the day.  Doesn't seem to be related to meals.  Lasts 10-15 minutes, resolves on its own.  No associated symptoms (no dyspnea, palpitations, etc). At his last visit 6 months ago he was complaining of tingling sensations in his chest "underneath his heart".  This is the same now, but the pressure has also developed, and lasts longer.  Not related to activity, exertion, stress, eating.  No associated dyspnea, palpitations or other symptoms.  Denies side effects of medications. Admits to a slight cough/throat-clearing from the ACEI, but tolerable.   h/o hypertrophic obstructive cardiomyopathy (mild). He was supposed to have echocardiogram to re-evaluate this (and his murmur). It doesn't appear that this was ever done.  GERD: Takes Nexium daily. He gets recurrent reflux symptoms into the chest, and up into the throat at times, about once a week over the last few weeks only, related to late eating lately (eating 8:30-9). No recurrent dysphagia, as long as food is cut small.  Sometimes will have problems eating a sub (thick bread), so no longer eats this.  H/o dilatation by Dr. Earlean Shawl.  H/o elevated PSA.  He had been under the care of urology and had benign biopsies. He had this checked at his last visit, was even higher, and he was told to f/u with urologist.   He saw Dr. Louis Meckel, and had a 3 month f/u in May.  Repeat PSA the end of April was normal at 3.65.  He was told to f/u with him in 1 year.  Lab Results  Component Value Date   PSA 6.59 (H) 07/19/2015   PSA 4.29 (H) 11/01/2012   PSA 4.63 (H) 11/01/2012     Immunization History  Administered Date(s) Administered  . Tdap 10/16/2010  . Zoster 10/29/2011   refuses flu shots  Last colonoscopy: last was 2014 with Dr. Earlean Shawl (unsure when due again); he plans to schedule with him (to f/u colonoscopy and reflux to see if EGD needed) Last PSA:3.65 at [facility] in 09/2015, told to repeat in 1 year. Dentist: twice yearly  Ophtho: last 18 months ago Exercise:  Hasn't been able to play tennis due to foot problems (bone spur imbedded into his achilles tendon). Walks a lot on his job, getting 10-11K steps daily.  Other doctors caring for patient: Ophtho: Dr. Bing Plume Dentist: Dr. Pablo Lawrence GI: Dr. Earlean Shawl Derm: Dr. Denna Haggard Podiatry: Dr. Amalia Hailey Cardiology: Dr. Radford Pax ENT: Dr. Lytle Michaels: [facility]: Dr. Louis Meckel  Depression, Fall and Functional status screens:  See epic. All negative He has a living will and healthcare power of attorney.  Past Medical History:  Diagnosis Date  . Allergy   . Calcaneal fracture 2014   right; s/p boot  . Colon polyp   . Diverticulosis 6/09   rare L colon diverticuli  .  Elevated PSA 10/2012   benign prostate biopsies 11/2012  . Esophageal stricture    s/p dilatation (Dr. Earlean Shawl)  . GERD (gastroesophageal reflux disease)   . Heart murmur   . Hypertension   . Hypertrophic obstructive cardiomyopathy    mild--Dr. Radford Pax (previously)  . Internal hemorrhoid 6/09  . Migraine age 65   resolved after nasal surgery    Past Surgical History:  Procedure Laterality Date  . CHOLECYSTECTOMY  5/03  . COLONOSCOPY  11/2007, 2011, 2014   Dr. Rudi Rummage again 2019  . ELBOW SURGERY Right 06/2013   bone spur; Dr. Percell Miller  . ESOPHAGOGASTRODUODENOSCOPY  2012    with dilatation.  Dr. Earlean Shawl  . LIPOMA EXCISION     Dr. Stanford Breed from L paramedian prevertebral space  . NASAL POLYP SURGERY  2008  . SHOULDER ARTHROSCOPY  12/03; 1/06   right; left    Social History   Social History  . Marital status: Married    Spouse name: N/A  . Number of children: N/A  . Years of education: N/A   Occupational History  . CEO of many companies [employer]   Social History Main Topics  . Smoking status: Former Smoker    Quit date: 06/24/1983  . Smokeless tobacco: Never Used  . Alcohol use Yes     Comment: 1 scotch per week or less  . Drug use: No  . Sexual activity: Yes    Partners: Female   Other Topics Concern  . Not on file   Social History Narrative   Divorced. 2 biologic children, a son and a daughter.  Adopted 3 children. 1 granddaughter Tanja Port, [place], born 06/2014)    Family History  Problem Relation Age of Onset  . Hypertension Mother   . Dementia Mother   . Cancer Mother     bladder, metastatic  . Diabetes Neg Hx   . Prostate cancer Neg Hx   . Colon cancer Neg Hx     Outpatient Encounter Prescriptions as of 02/21/2016  Medication Sig Note  . aspirin 81 MG tablet Take 81 mg by mouth daily.     Marland Kitchen esomeprazole (NEXIUM) 40 MG capsule Take 40 mg by mouth daily before breakfast.   07/19/2015: He is actually taking 2 of the OTC Nexium (getting from [place])  . fexofenadine (ALLEGRA) 180 MG tablet Take 180 mg by mouth daily.   Marland Kitchen lisinopril (PRINIVIL,ZESTRIL) 40 MG tablet Take 1 tablet (40 mg total) by mouth daily.    No facility-administered encounter medications on file as of 02/21/2016.     No Known Allergies   ROS: The patient denies anorexia, fever, headaches, vision loss, decreased hearing, ear pain, hoarseness, palpitations, dizziness, syncope, dyspnea on exertion, swelling, nausea, vomiting, diarrhea, constipation, abdominal pain, melena, hematochezia, hematuria, incontinence, erectile dysfunction, nocturia, weakened urine  stream, dysuria, genital lesions, joint pains, numbness, tingling, weakness, tremor, suspicious skin lesions, depression, anxiety, abnormal bleeding/bruising, or enlarged lymph nodes.  Sees derm (Dr. Denna Haggard) regularly (precancers) --earlier this week. He reports that ridging of fingernails is better since trip to [place] where he drank more juice. He juices his own fruit at home, and nails have remained good.  He hasn't had as much juice recently, but nails remain fine. +chest pressure as per HPI. L heel pain (related to bone spurs) Only sleeps 4-5 hrs/night, whole life.   PHYSICAL EXAM:  BP 130/80 (BP Location: Left Arm, Patient Position: Sitting, Cuff Size: Normal)   Pulse 76   Ht 5' 10.5" (1.791 m)  Wt 225 lb 9.6 oz (102.3 kg)   BMI 31.91 kg/m  134/78 on repeat by MD   General Appearance:  Alert, cooperative, no distress, appears stated age   Head:  Normocephalic, without obvious abnormality, atraumatic   Eyes:  PERRL, conjunctiva/corneas clear, EOM's intact, fundi benign   Ears:  Normal TM's and external ear canals   Nose:  Nares normal, mucosa normal, no drainage or sinus tenderness   Throat:  Lips, mucosa, and tongue normal; teeth and gums normal   Neck:  Supple, no lymphadenopathy; thyroid: no enlargement/tenderness/nodules; no carotid  bruit or JVD. WHSS with small subcutaneous nodule L neck  Back:  Spine nontender, no curvature, ROM normal, no CVA tenderness   Lungs:  Clear to auscultation bilaterally without wheezes, rales or ronchi; respirations unlabored   Chest Wall:  No tenderness or deformity   Heart:  Regular rate and rhythm, S1 and S2 normal, no murmur, rub or gallop.  I could not appreciate the murmur that I heard at his last visit (January) today  Breast Exam:  No chest wall tenderness, masses or gynecomastia   Abdomen:  Soft, non-tender, nondistended, normoactive bowel sounds, no masses, no hepatosplenomegaly   Genitalia:  Deferred to urologist.   Rectal:   Deferred to urologist.   Extremities:  No clubbing, cyanosis or edema   Pulses:  2+ and symmetric all extremities   Skin:  Skin color, texture, turgor normal, no rashes or lesions noted  Lymph nodes:  Cervical, supraclavicular, and axillary nodes normal   Neurologic:  CNII-XII intact, normal strength, sensation and gait; reflexes 2+ and symmetric throughout                  Psych: Normal mood, affect, hygiene and grooming   ASSESSMENT/PLAN:  Medicare annual wellness visit, initial  Essential hypertension, benign - controlled - Plan: Lipid panel, EKG 12-Lead  Gastroesophageal reflux disease, esophagitis presence not specified - continue Nexium. f/u with Dr. Earlean Shawl, especially given c/o chest sx  Obesity (BMI 30-39.9) - weight loss and proper diet, exercise reviewedin detail  Elevated PSA - normal on repeat.  To be checked again by Dr. Karsten Ro.  Heart murmur - check echo.  murmurless noticeable today - Plan: EKG 12-Lead  Medication monitoring encounter - Plan: Glucose, random  Immunization due - Plan: Pneumococcal conjugate vaccine 13-valent  Other fatigue - Plan: TSH, CBC with Differential/Platelet  Need for hepatitis C screening test - Plan: Hepatitis C antibody  Screening for diabetes mellitus - Plan: Glucose, random   prevnar-13 EKG normal Echo scheduled  Advised of sleep recommendations--encouraged to get more sleep. Diet reviewed, weight loss, exercise discussed in detail. F/u with Dr. Earlean Shawl planned, and agree (especially if chest pain is noncardiac. Await echo). Encouraged to schedule routine ophtho visit.  Recommended at least 30 minutes of aerobic activity at least 5 days/week, weight bearing exercise at least 2x/week; proper sunscreen use reviewed; healthy diet and alcohol recommendations (less than or equal to 2 drinks/day) reviewed; regular seatbelt use; changing batteries in smoke detectors. Immunization recommendations discussed. He declines flu shot. Discussed  Prevnar-13 today, pneumovax next year. Colonoscopy recommendations reviewed--?when due   MOST form filled out. Full Code, Full Care   CBC,  Glu, lipid, TSH, Hepatitis C   Medicare Attestation I have personally reviewed: The patient's medical and social history Their use of alcohol, tobacco or illicit drugs Their current medications and supplements The patient's functional ability including ADLs,fall risks, home safety risks, cognitive, and hearing and visual impairment Diet  and physical activities Evidence for depression or mood disorders  The patient's weight, height, and BMI have been recorded in the chart.  I have made referrals, counseling, and provided education to the patient based on review of the above and I have provided the patient with a written personalized care plan for preventive services.     Marcellino Fidalgo A, MD   02/21/2016

## 2016-02-21 ENCOUNTER — Ambulatory Visit (INDEPENDENT_AMBULATORY_CARE_PROVIDER_SITE_OTHER): Payer: Medicare Other | Admitting: Family Medicine

## 2016-02-21 ENCOUNTER — Encounter: Payer: Self-pay | Admitting: Family Medicine

## 2016-02-21 VITALS — BP 130/80 | HR 76 | Ht 70.5 in | Wt 225.6 lb

## 2016-02-21 DIAGNOSIS — R5383 Other fatigue: Secondary | ICD-10-CM

## 2016-02-21 DIAGNOSIS — K219 Gastro-esophageal reflux disease without esophagitis: Secondary | ICD-10-CM | POA: Diagnosis not present

## 2016-02-21 DIAGNOSIS — R011 Cardiac murmur, unspecified: Secondary | ICD-10-CM

## 2016-02-21 DIAGNOSIS — I10 Essential (primary) hypertension: Secondary | ICD-10-CM | POA: Diagnosis not present

## 2016-02-21 DIAGNOSIS — Z0000 Encounter for general adult medical examination without abnormal findings: Secondary | ICD-10-CM | POA: Diagnosis not present

## 2016-02-21 DIAGNOSIS — E669 Obesity, unspecified: Secondary | ICD-10-CM | POA: Diagnosis not present

## 2016-02-21 DIAGNOSIS — Z23 Encounter for immunization: Secondary | ICD-10-CM

## 2016-02-21 DIAGNOSIS — Z5181 Encounter for therapeutic drug level monitoring: Secondary | ICD-10-CM

## 2016-02-21 DIAGNOSIS — R972 Elevated prostate specific antigen [PSA]: Secondary | ICD-10-CM

## 2016-02-21 DIAGNOSIS — Z131 Encounter for screening for diabetes mellitus: Secondary | ICD-10-CM

## 2016-02-21 DIAGNOSIS — Z1159 Encounter for screening for other viral diseases: Secondary | ICD-10-CM

## 2016-02-21 LAB — CBC WITH DIFFERENTIAL/PLATELET
BASOS PCT: 1 %
Basophils Absolute: 74 cells/uL (ref 0–200)
EOS ABS: 444 {cells}/uL (ref 15–500)
Eosinophils Relative: 6 %
HEMATOCRIT: 42.3 % (ref 38.5–50.0)
HEMOGLOBIN: 14.1 g/dL (ref 13.2–17.1)
LYMPHS ABS: 1924 {cells}/uL (ref 850–3900)
Lymphocytes Relative: 26 %
MCH: 27.1 pg (ref 27.0–33.0)
MCHC: 33.3 g/dL (ref 32.0–36.0)
MCV: 81.2 fL (ref 80.0–100.0)
MONO ABS: 518 {cells}/uL (ref 200–950)
MPV: 11.1 fL (ref 7.5–12.5)
Monocytes Relative: 7 %
Neutro Abs: 4440 cells/uL (ref 1500–7800)
Neutrophils Relative %: 60 %
Platelets: 183 10*3/uL (ref 140–400)
RBC: 5.21 MIL/uL (ref 4.20–5.80)
RDW: 13.5 % (ref 11.0–15.0)
WBC: 7.4 10*3/uL (ref 4.0–10.5)

## 2016-02-21 LAB — TSH: TSH: 0.53 mIU/L (ref 0.40–4.50)

## 2016-02-21 NOTE — Patient Instructions (Addendum)
  HEALTH MAINTENANCE RECOMMENDATIONS:  It is recommended that you get at least 30 minutes of aerobic exercise at least 5 days/week (for weight loss, you may need as much as 60-90 minutes). This can be any activity that gets your heart rate up. This can be divided in 10-15 minute intervals if needed, but try and build up your endurance at least once a week.  Weight bearing exercise is also recommended twice weekly.  Eat a healthy diet with lots of vegetables, fruits and fiber.  "Colorful" foods have a lot of vitamins (ie green vegetables, tomatoes, red peppers, etc).  Limit sweet tea, regular sodas and alcoholic beverages, all of which has a lot of calories and sugar.  Up to 2 alcoholic drinks daily may be beneficial for men (unless trying to lose weight, watch sugars).  Drink a lot of water.  Sunscreen of at least SPF 30 should be used on all sun-exposed parts of the skin when outside between the hours of 10 am and 4 pm (not just when at beach or pool, but even with exercise, golf, tennis, and yard work!)  Use a sunscreen that says "broad spectrum" so it covers both UVA and UVB rays, and make sure to reapply every 1-2 hours.  Remember to change the batteries in your smoke detectors when changing your clock times in the spring and fall.  Use your seat belt every time you are in a car, and please drive safely and not be distracted with cell phones and texting while driving.     Erik Leon , Thank you for taking time to come for your Medicare Wellness Visit. I appreciate your ongoing commitment to your health goals. Please review the following plan we discussed and let me know if I can assist you in the future.   These are the goals we discussed: Goals    None      This is a list of the screening recommended for you and due dates:  Health Maintenance  Topic Date Due  .  Hepatitis C: One time screening is recommended by Center for Disease Control  (CDC) for  adults born from [birth year] through 1966.    [birth year]  . Pneumonia vaccines (1 of 2 - PCV13) 12/15/2014  . Flu Shot  01/22/2016  . Colon Cancer Screening  12/05/2017  . Tetanus Vaccine  10/15/2020  . Shingles Vaccine  Completed   We gave you Prevnar-13 today.  You will need pneumovax next year High dose flu shots are recommended yearly (you declined). I truly do not know when your next colonoscopy is due--please check with Dr. Earlean Shawl  Work hard on cutting back carbs, excess calories, and lose inches from your waist. Please try and get at least 150 minutes of aerobic exercise each week, as we discussed.  We should be sending you for an echocardiogram--call us if you do not hear anything about this appointment within the week.  It is recommended that you get at least 8 hours of sleep a night.

## 2016-02-22 LAB — LIPID PANEL
Cholesterol: 157 mg/dL (ref 125–200)
HDL: 39 mg/dL — AB (ref >=40)
LDL Cholesterol: 96 mg/dL (ref <130)
TRIGLYCERIDES: 111 mg/dL (ref <150)
Total CHOL/HDL Ratio: 4 Ratio (ref <=5.0)
VLDL: 22 mg/dL (ref <30)

## 2016-02-22 LAB — HEPATITIS C ANTIBODY: HCV AB: NEGATIVE

## 2016-02-22 LAB — GLUCOSE, RANDOM: Glucose, Bld: 91 mg/dL (ref 65–99)

## 2016-03-06 ENCOUNTER — Other Ambulatory Visit: Payer: Self-pay | Admitting: Family Medicine

## 2016-03-06 ENCOUNTER — Other Ambulatory Visit: Payer: Self-pay

## 2016-03-06 ENCOUNTER — Ambulatory Visit (HOSPITAL_COMMUNITY): Payer: Medicare Other | Attending: Cardiology

## 2016-03-06 DIAGNOSIS — I119 Hypertensive heart disease without heart failure: Secondary | ICD-10-CM | POA: Insufficient documentation

## 2016-03-06 DIAGNOSIS — I340 Nonrheumatic mitral (valve) insufficiency: Secondary | ICD-10-CM | POA: Insufficient documentation

## 2016-03-06 DIAGNOSIS — R011 Cardiac murmur, unspecified: Secondary | ICD-10-CM | POA: Diagnosis not present

## 2016-03-10 ENCOUNTER — Other Ambulatory Visit: Payer: Self-pay | Admitting: Family Medicine

## 2016-03-11 ENCOUNTER — Other Ambulatory Visit: Payer: Self-pay | Admitting: Family Medicine

## 2016-03-11 DIAGNOSIS — R011 Cardiac murmur, unspecified: Secondary | ICD-10-CM

## 2016-07-25 ENCOUNTER — Other Ambulatory Visit: Payer: Self-pay | Admitting: Family Medicine

## 2016-07-25 DIAGNOSIS — I10 Essential (primary) hypertension: Secondary | ICD-10-CM

## 2017-01-30 ENCOUNTER — Other Ambulatory Visit: Payer: Self-pay | Admitting: Family Medicine

## 2017-01-30 DIAGNOSIS — I10 Essential (primary) hypertension: Secondary | ICD-10-CM

## 2017-03-25 ENCOUNTER — Ambulatory Visit (INDEPENDENT_AMBULATORY_CARE_PROVIDER_SITE_OTHER): Payer: Medicare Other

## 2017-03-25 ENCOUNTER — Ambulatory Visit (INDEPENDENT_AMBULATORY_CARE_PROVIDER_SITE_OTHER): Payer: Medicare Other | Admitting: Podiatry

## 2017-03-25 ENCOUNTER — Encounter: Payer: Self-pay | Admitting: Podiatry

## 2017-03-25 ENCOUNTER — Telehealth: Payer: Self-pay | Admitting: Podiatry

## 2017-03-25 VITALS — BP 145/85 | HR 68 | Resp 18

## 2017-03-25 DIAGNOSIS — M779 Enthesopathy, unspecified: Secondary | ICD-10-CM | POA: Diagnosis not present

## 2017-03-25 DIAGNOSIS — M7732 Calcaneal spur, left foot: Secondary | ICD-10-CM | POA: Diagnosis not present

## 2017-03-25 DIAGNOSIS — M659 Synovitis and tenosynovitis, unspecified: Secondary | ICD-10-CM

## 2017-03-25 MED ORDER — METHYLPREDNISOLONE 4 MG PO TBPK: ORAL_TABLET | ORAL | 0 refills | Status: DC

## 2017-03-25 MED ORDER — DICLOFENAC SODIUM 1 % TD GEL
2.0000 g | Freq: Four times a day (QID) | TRANSDERMAL | 2 refills | Status: DC
Start: 2017-03-25 — End: 2018-01-07

## 2017-03-25 MED ORDER — MELOXICAM 7.5 MG PO TABS
7.5000 mg | ORAL_TABLET | Freq: Every day | ORAL | 0 refills | Status: DC
Start: 2017-03-25 — End: 2017-04-27

## 2017-03-25 NOTE — Patient Instructions (Signed)
Start with the medrol dose pack. Once this is complete you can start the mobic (meloxicam). If it makes your acid reflux worse then stop it. Take this with foot.   Ice the area for 20 min two times a day  Wear night splint when watching TV or sitting until it gets comfortable.   I have also ordered a topical antiinflammatory to apply to the back of the heel as needed.   If you have any questions, please let me know.    Achilles Tendinitis Rehab Ask your health care provider which exercises are safe for you. Do exercises exactly as told by your health care provider and adjust them as directed. It is normal to feel mild stretching, pulling, tightness, or discomfort as you do these exercises, but you should stop right away if you feel sudden pain or your pain gets worse. Do not begin these exercises until told by your health care provider. Stretching and range of motion exercises These exercises warm up your muscles and joints and improve the movement and flexibility of your ankle. These exercises also help to relieve pain, numbness, and tingling. Exercise A: Standing wall calf stretch, knee straight  1. Stand with your hands against a wall. 2. Extend your __________ leg behind you and bend your front knee slightly. Keep both of your heels on the floor. 3. Point the toes of your back foot slightly inward. 4. Keeping your heels on the floor and your back knee straight, shift your weight toward the wall. Do not allow your back to arch. You should feel a gentle stretch in your calf. 5. Hold this position for seconds. Repeat __________ times. Complete this stretch __________ times per day. Exercise B: Standing wall calf stretch, knee bent 1. Stand with your hands against a wall. 2. Extend your __________ leg behind you, and bend your front knee slightly. Keep both of your heels on the floor. 3. Point the toes of your back foot slightly inward. 4. Keeping your heels on the floor, unlock your back  knee so that it is bent. You should feel a gentle stretch deep in your calf. 5. Hold this position for __________ seconds. Repeat __________ times. Complete this stretch __________ times per day. Strengthening exercises These exercises build strength and control of your ankle. Endurance is the ability to use your muscles for a long time, even after they get tired. Exercise C: Plantar flexion with band  1. Sit on the floor with your __________ leg extended. You may put a pillow under your calf to give your foot more room to move. 2. Loop a rubber exercise band or tube around the ball of your __________ foot. The ball of your foot is on the walking surface, right under your toes. The band or tube should be slightly tense when your foot is relaxed. If the band or tube slips, you can put on your shoe or put a washcloth between the band and your foot to help it stay in place. 3. Slowly point your toes downward, pushing them away from you. 4. Hold this position for __________ seconds. 5. Slowly release the tension in the band or tube, controlling smoothly until your foot is back to the starting position. Repeat __________ times. Complete this exercise __________ times per day. Exercise D: Heel raise with eccentric lower  1. Stand on a step with the balls of your feet. The ball of your foot is on the walking surface, right under your toes. ? Do not put  your heels on the step. ? For balance, rest your hands on the wall or on a railing. 2. Rise up onto the balls of your feet. 3. Keeping your heels up, shift all of your weight to your __________ leg and pick up your other leg. 4. Slowly lower your __________ leg so your heel drops below the level of the step. 5. Put down your foot. If told by your health care provider, build up to:  3 sets of 15 repetitions while keeping your knees straight.  3 sets of 15 repetitions while keeping your knees bent as far as told by your health care  provider.  Complete this exercise __________ times per day. If this exercise is too easy, try doing it while wearing a backpack with weights in it. Balance exercises These exercises improve or maintain your balance. Balance is important in preventing falls. Exercise E: Single leg stand 1. Without shoes, stand near a railing or in a door frame. Hold on to the railing or door frame as needed. 2. Stand on your __________ foot. Keep your big toe down on the floor and try to keep your arch lifted. 3. Hold this position for __________ seconds. Repeat __________ times. Complete this exercise __________ times per day. If this exercise is too easy, you can try it with your eyes closed or while standing on a pillow. This information is not intended to replace advice given to you by your health care provider. Make sure you discuss any questions you have with your health care provider. Document Released: 01/08/2005 Document Revised: 02/14/2016 Document Reviewed: 02/13/2015 Elsevier Interactive Patient Education  [employer].

## 2017-03-25 NOTE — Telephone Encounter (Signed)
Called pt in regards to request of x-rays he wants via e-mail. Asked pt to call me back at [phone] directly.

## 2017-03-25 NOTE — Progress Notes (Addendum)
Subjective:    Patient ID: Erik Leon, male    DOB: 08/08/1950, 66 y.o.   MRN: [id number]  HPI  66 year old male presents the also for concerns of pain in the back of his left foot which is been ongoing about 1 year. He states that he had an injury in October 2017 after stepping off a ledge. He states that he gets pain in the back of his heel. He has been icing reason no other treatment over the last year he has not seen any other physician for this. He states he has not been playing golf because of the pain. He denies numbness or tingling. He has wear sandals which does help eliminate in symptoms. He is also been trying to walking pretty had at home and this first happened but is not been helping. He has no other complaints today. Denies any claudication symptoms. Review of Systems  All other systems reviewed and are negative.  Past Medical History:  Diagnosis Date  . Allergy   . Calcaneal fracture 2014   right; s/p boot  . Colon polyp   . Diverticulosis 6/09   rare L colon diverticuli  . Elevated PSA 10/2012   benign prostate biopsies 11/2012  . Esophageal stricture    s/p dilatation (Dr. Earlean Shawl)  . GERD (gastroesophageal reflux disease)   . Heart murmur   . Hypertension   . Hypertr obst cardiomyop    mild--Dr. Radford Pax (previously)  . Internal hemorrhoid 6/09  . Migraine age 11   resolved after nasal surgery    Past Surgical History:  Procedure Laterality Date  . CHOLECYSTECTOMY  5/03  . COLONOSCOPY  11/2007, 2011, 2014   Dr. Rudi Rummage again 2019  . ELBOW SURGERY Right 06/2013   bone spur; Dr. Percell Miller  . ESOPHAGOGASTRODUODENOSCOPY  2012   with dilatation.  Dr. Earlean Shawl  . LIPOMA EXCISION     Dr. Stanford Breed from L paramedian prevertebral space  . NASAL POLYP SURGERY  2008  . SHOULDER ARTHROSCOPY  12/03; 1/06   right; left     Current Outpatient Prescriptions:  .  aspirin 81 MG tablet, Take 81 mg by mouth daily.  , Disp: , Rfl:  .  diclofenac sodium (VOLTAREN) 1  % GEL, Apply 2 g topically 4 (four) times daily. Rub into affected area of foot 2 to 4 times daily, Disp: 100 g, Rfl: 2 .  esomeprazole (NEXIUM) 40 MG capsule, Take 40 mg by mouth daily before breakfast.  , Disp: , Rfl:  .  fexofenadine (ALLEGRA) 180 MG tablet, Take 180 mg by mouth daily., Disp: , Rfl:  .  lisinopril (PRINIVIL,ZESTRIL) 40 MG tablet, TAKE 1 TABLET(40 MG) BY MOUTH DAILY, Disp: 90 tablet, Rfl: 0 .  meloxicam (MOBIC) 7.5 MG tablet, Take 1 tablet (7.5 mg total) by mouth daily., Disp: 30 tablet, Rfl: 0 .  methylPREDNISolone (MEDROL DOSEPAK) 4 MG TBPK tablet, Take as directed, Disp: 21 tablet, Rfl: 0  No Known Allergies  Social History   Social History  . Marital status: Married    Spouse name: N/A  . Number of children: N/A  . Years of education: N/A   Occupational History  . Leon of many companies [employer]   Social History Main Topics  . Smoking status: Former Smoker    Quit date: 06/24/1983  . Smokeless tobacco: Never Used  . Alcohol use Yes     Comment: 1 scotch per week or less  . Drug use: No  . Sexual activity:  Yes    Partners: Female   Other Topics Concern  . Not on file   Social History Narrative   Divorced. 2 biologic children, a son and a daughter.  Adopted 3 children. 1 granddaughter Tanja [place], [place], born 06/2014)         Objective:   Physical Exam General: AAO x3, NAD  Dermatological: Skin is warm, dry and supple bilateral. Nails x 10 are well manicured; remaining integument appears unremarkable at this time. There are no open sores, no preulcerative lesions, no rash or signs of infection present.  Vascular: Dorsalis Pedis artery and Posterior Tibial artery pedal pulses are 2/4 bilateral with immedate capillary fill time. Pedal hair growth present.  There is no pain with calf compression, swelling, warmth, erythema.   Neruologic: Grossly intact via light touch bilateral.Protective threshold with Semmes Wienstein monofilament intact to all  pedal sites bilateral.   Musculoskeletal: There is tenderness palpation of the posterior aspect the left heel along an area of a prominent heel spur. There is pain upon dorsiflexion of the ankle and there is equinus present. Thompson test is negative and Achilles tendon appears to be intact. There is no pain with lateral compression of the calcaneus. No pain along the course or insertion the plantar fascia. No pain to the ankle joint or to the foot otherwise. MMT 4/5 on the left foot in dorsiflexion otherwise 5 out of 5 bilaterally. Range of motion intact otherwise.  Gait: Unassisted, Nonantalgic.      Assessment & Plan:  66 year old male with left Achilles tendinitis, posterior heel spur -Treatment options discussed including all alternatives, risks, and complications -Etiology of symptoms were discussed -X-rays were obtained and reviewed with the patient. Calcaneal spurring is present with posterior heel. No definitive evidence of acute fracture. -Prescribed a Medrol Dosepak. Once this was completed can start meloxicam. Discussed with him that this makes the acid reflux worsened hold off on any anti-inflammatory. Also prescribed voltaren gel to apply topically.  -Ice to the area daily. -Stretching exercises daily were discussed. -Night splint dispensed. -He is inquiring about surgery. We discussed the surgery as well as postoperative course. He has had no conservative treatment so we would like to proceed with this. Discussed immobilization in a CAM boot but he states that he did try this for short time which did not help. He declined an MRI and as this is a chronic issue I don't think this will change our treatment at this point. -Dispensed a heel gel cushion- on the way out today he states that artery felt better with that cushion. -He is asking for steroid injection however given the location of his pain I would like to not do this around the Achilles tendon and explained why.  -Follow-up in  3 weeks or sooner if any issues are to arise. Call any questions concerns in the meantime.  Celesta Gentile, DPM

## 2017-04-21 ENCOUNTER — Other Ambulatory Visit: Payer: Self-pay | Admitting: Family Medicine

## 2017-04-21 DIAGNOSIS — I10 Essential (primary) hypertension: Secondary | ICD-10-CM

## 2017-04-21 NOTE — Telephone Encounter (Signed)
Called pt and states that he is overdue for an appt so I have scheduled him a med check appt for end of November since he will be out of town half of November. Will refill med for 30 days

## 2017-04-23 ENCOUNTER — Encounter: Payer: Self-pay | Admitting: Podiatry

## 2017-04-23 ENCOUNTER — Ambulatory Visit (INDEPENDENT_AMBULATORY_CARE_PROVIDER_SITE_OTHER): Payer: Medicare Other | Admitting: Podiatry

## 2017-04-23 DIAGNOSIS — M779 Enthesopathy, unspecified: Secondary | ICD-10-CM | POA: Diagnosis not present

## 2017-04-23 DIAGNOSIS — B351 Tinea unguium: Secondary | ICD-10-CM

## 2017-04-26 NOTE — Progress Notes (Signed)
Subjective: Erik Leon presents the office they for follow-up evaluation of posterior heel pain on the left side. He states the pain is doing much better max the pain level 6/10 he feels that he has made good improvement since I last saw him. He has been stretching, icing daily as well as using the night splint during the day while relaxing. He denies any recent injury or trauma. He has been able to play golf the last saw him. He has no other concerns today other than he has noticed some discoloration to his left third toenail which is been white to yellow discoloration but denies any pain swelling or redness or drainage or any signs of infection. Denies any systemic complaints such as fevers, chills, nausea, vomiting. No acute changes since last appointment, and no other complaints at this time.   Objective: AAO x3, NAD DP/PT pulses palpable bilaterally, CRT less than 3 seconds There is tenderness palpation to the posterior aspect left heel on the insertion of the Achilles tendon into the calcaneus. Thompson test is negative and there is no defect noted within the Achilles tendon. There is no pain with lateral compression of the calcaneus. Plantar fascia intact without any pain. There is mild yellow to white discoloration to the toenails there is no significant hypertrophy of the toenails. There is no pain the nails there is no swelling redness or drainage or any clinical signs of infection present. No open lesions or pre-ulcerative lesions.  No pain with calf compression, swelling, warmth, erythema  Assessment: Improving left heel pain from Achilles tendinitis; onychomycosis  Plan: -All treatment options discussed with the patient including all alternatives, risks, complications.  -At this point he is having chronic pain to the left heel, Achilles tendon. I discussed other options including MRI, PT, EPAT, surgery. He would like to hold off on any further treatment at this point as he is doing well  and over the next 4-6 weeks she'll see how he is doing. If his symptoms continue he'll consider EPAT. I provided the pressure on this for him to review -In regards the nail fungus discussed options and he elects to proceed with topical treatment. I ordered a compound cream for onychomycosis through [employer]. Discussed applications instructions, side effects, duration, success rates. -RTC 4-6 weeks or sooner if needed.   -Patient encouraged to call the office with any questions, concerns, change in symptoms.   Celesta Gentile, DPM

## 2017-04-27 ENCOUNTER — Other Ambulatory Visit: Payer: Self-pay | Admitting: Podiatry

## 2017-05-21 ENCOUNTER — Encounter: Payer: Medicare Other | Admitting: Family Medicine

## 2017-06-03 ENCOUNTER — Telehealth: Payer: Self-pay | Admitting: Family Medicine

## 2017-06-03 NOTE — Telephone Encounter (Signed)
Left message for pt to call. Per Dr. Tomi Bamberger pt needs a med check appt before any more refills.

## 2017-07-02 DIAGNOSIS — Z86010 Personal history of colonic polyps: Secondary | ICD-10-CM | POA: Diagnosis not present

## 2017-07-02 DIAGNOSIS — D122 Benign neoplasm of ascending colon: Secondary | ICD-10-CM | POA: Diagnosis not present

## 2017-07-02 DIAGNOSIS — K635 Polyp of colon: Secondary | ICD-10-CM | POA: Diagnosis not present

## 2017-07-02 DIAGNOSIS — K648 Other hemorrhoids: Secondary | ICD-10-CM | POA: Diagnosis not present

## 2017-07-02 DIAGNOSIS — Z1211 Encounter for screening for malignant neoplasm of colon: Secondary | ICD-10-CM | POA: Diagnosis not present

## 2017-07-02 DIAGNOSIS — D126 Benign neoplasm of colon, unspecified: Secondary | ICD-10-CM | POA: Diagnosis not present

## 2017-07-02 DIAGNOSIS — D120 Benign neoplasm of cecum: Secondary | ICD-10-CM | POA: Diagnosis not present

## 2017-07-02 LAB — HM COLONOSCOPY

## 2017-07-06 ENCOUNTER — Encounter: Payer: Self-pay | Admitting: *Deleted

## 2017-07-09 DIAGNOSIS — H2513 Age-related nuclear cataract, bilateral: Secondary | ICD-10-CM | POA: Diagnosis not present

## 2017-07-11 NOTE — Progress Notes (Signed)
Chief Complaint  Patient presents with  . Hypertension    fasting med check. Had colonoscopy 2 weeks ago with Dr. Rico Sheehan. Has had some lightheadedness when he stands up over the last 4-5 days that comes and goes. Pt states his skin is very dry but does not use any lotions.   . Flu Vaccine    declined.     Patient presents for follow-up on hypertension.  He hasn't been seen here since his wellness visit in 01/2016 (and is past due for physical/wellness visit). He reports that his blood pressure is running 120/70's after taking medication. If he misses a day or two of medication (with travel) it goes up to 130/80-85. He hasn't taken his medication yet today (usually takes it 5:30 in the morning). Denies headaches.    He has been feeling a little dizzy when standing over the last few days. Has been having some more mucus/allergies lately. Describes it as feeling off-balance, not lightheaded. He is complaining of an "unusual amount of dryness" in his skin.  This has been going on for 3 years, worsening, not related to cold weather. Nails have also been brittle recently, complaining of weight gain and increased fatigue. He reports being "unusually tired" in the last couple of months. He has gained weight in the same time frame, without any change in diet, even through the holidays.  He has been walking properties a lot, doesn't think there has been much change in exercise.  No longer having any chest pressure or DOE (not exerting himself much) as mentioned at prior visit.  Denies side effects of medications. Admits to a slight cough/throat-clearing from the ACEI, but tolerable.  Unchanged x 5 years, doesn't bother him.   h/o hypertrophic obstructive cardiomyopathy (mild). He had repeat echocardiogram in 02/2016 which showed normal LV size with mild LV hypertrophy. EF 60-65%. Normal RV size and systolic function. No significant valvular abnormalities.  GERD: Takes Nexium daily. He now takes it  BID (2 of the OTC Nexium, twice daily, 4/d total), as he was getting recurrent symptoms in the evenings after eating, even if not eating late. No recurrent dysphagia, as long as food is cut small. Sometimes will have problems eating a sub (thick bread), so no longer eats this.  H/o dilatation by Dr. Earlean Shawl. He reports that Dr. Earlean Shawl plans to do an EGD in the next month or two. He had colonoscopy earlier this month (07/02/17). 5 polyps were found and it was recommended he have colonoscopy repeated in 3 years (06/2020).  H/o elevated PSA. He had been under the care of urology and had benign biopsies in the past.  He was last seen in June, reports PSA was fine.  Records haven't been received.  PMH, PSH, SH reviewed  Outpatient Encounter Medications as of 07/13/2017  Medication Sig Note  . aspirin 81 MG tablet Take 81 mg by mouth daily.     Marland Kitchen esomeprazole (NEXIUM) 40 MG capsule Take 40 mg by mouth daily before breakfast.   07/19/2015: He is actually taking 2 of the OTC Nexium (getting from [place])  . fexofenadine (ALLEGRA) 180 MG tablet Take 180 mg by mouth daily.   Marland Kitchen lisinopril (PRINIVIL,ZESTRIL) 40 MG tablet TAKE 1 TABLET(40 MG) BY MOUTH DAILY   . NON FORMULARY [facility]  Onychomycosis Nail Lacquer -  Fluconazole 2%, Terbinafine 1% DMSO Apply to affected nail once daily Qty. 120 gm 3 refills   . [DISCONTINUED] lisinopril (PRINIVIL,ZESTRIL) 40 MG tablet TAKE 1 TABLET(40 MG) BY  MOUTH DAILY   . diclofenac sodium (VOLTAREN) 1 % GEL Apply 2 g topically 4 (four) times daily. Rub into affected area of foot 2 to 4 times daily (Patient not taking: Reported on 07/13/2017)   . meloxicam (MOBIC) 7.5 MG tablet TAKE 1 TABLET BY MOUTH DAILY (Patient not taking: Reported on 07/13/2017)   . [DISCONTINUED] methylPREDNISolone (MEDROL DOSEPAK) 4 MG TBPK tablet Take as directed    No facility-administered encounter medications on file as of 07/13/2017.    No Known Allergies  ROS: no fever, chills,  headaches,  Mild dizziness related to equilibrium (not light-headed) as per HPI.  Some allergies/congestion. Chronic cough, unchanged. No shortness of breath, chest pain. Reflux is controlled.  No GI complaints. No bleeding, bruising, rash, just dry skin as per HPI.  6# weight gain since last visit over a year ago.  PHYSICAL EXAM:  BP 130/76 (BP Location: Right Arm, Cuff Size: Normal)   Pulse 84   Ht 5\' 10"  (1.778 m)   Wt 231 lb 6.4 oz (105 kg)   BMI 33.20 kg/m   Wt Readings from Last 3 Encounters:  07/13/17 231 lb 6.4 oz (105 kg)  02/21/16 225 lb 9.6 oz (102.3 kg)  07/19/15 225 lb 9.6 oz (102.3 kg)   Not orthostatic (see extended VS).  Well developed, well nourished patient, in no distress HEENT: PERRL, EOMI, conjunctiva and sclera are clear, OP clear, sinuses nontender. Neck: No lymphadenopathy or thyromegaly, no carotid bruit Heart:  Regular rate and rhythm; I no longer hear the blowing/musical murmur heard at last visit, barely any murmur appreciated today.  Lungs:  Clear bilaterally, without wheezes, rales or ronchi Abdomen:  Soft, nontender, nondistended, no hepatosplenomegaly or masses, normal bowel sounds Extremities:  No clubbing, cyanosis; trace pretibial edema. 2+ pulses.  Neuro:  Alert and oriented x 3, cranial nerves grossly intact.  Normal gait. Back:  No spine or CVA tenderness Skin: Dry diffusely, worse on legs Psych:  Normal mood, affect, hygiene and grooming, normal speech, eye contact   ASSESSMENT/PLAN:  Essential hypertension, benign - controlled; discussed changing to ARB to help decrease cough--pt hesitant; consider in future (since so many ARB recalls currently) - Plan: Comprehensive metabolic panel, Lipid panel, lisinopril (PRINIVIL,ZESTRIL) 40 MG tablet  Gastroesophageal reflux disease, esophagitis presence not specified - controlled with Nexium 40mg  BID; planning EGD with Dr. Earlean Shawl soon. Counseled re: longterm PPI risks, needs for  supplements  Medication monitoring encounter - Plan: Comprehensive metabolic panel, CBC with Differential/Platelet, Magnesium  Immunization due - Plan: Flu vaccine HIGH DOSE PF (Fluzone High dose)  Fatigue, unspecified type - Plan: Comprehensive metabolic panel, CBC with Differential/Platelet, VITAMIN D 25 Hydroxy (Vit-D Deficiency, Fractures), TSH, Testosterone  Essential hypertension, benign - Plan: Comprehensive metabolic panel, Lipid panel, lisinopril (PRINIVIL,ZESTRIL) 40 MG tablet   Schedule AWV in 6 mos  Consider changing to ARB--not currently since so many recalled, and cough is unchanged.  Get records from [facility]

## 2017-07-13 ENCOUNTER — Ambulatory Visit (INDEPENDENT_AMBULATORY_CARE_PROVIDER_SITE_OTHER): Payer: Medicare Other | Admitting: Family Medicine

## 2017-07-13 ENCOUNTER — Encounter: Payer: Self-pay | Admitting: Family Medicine

## 2017-07-13 VITALS — BP 130/76 | HR 84 | Ht 70.0 in | Wt 231.4 lb

## 2017-07-13 DIAGNOSIS — R5383 Other fatigue: Secondary | ICD-10-CM

## 2017-07-13 DIAGNOSIS — K219 Gastro-esophageal reflux disease without esophagitis: Secondary | ICD-10-CM

## 2017-07-13 DIAGNOSIS — E559 Vitamin D deficiency, unspecified: Secondary | ICD-10-CM | POA: Diagnosis not present

## 2017-07-13 DIAGNOSIS — I10 Essential (primary) hypertension: Secondary | ICD-10-CM

## 2017-07-13 DIAGNOSIS — Z5181 Encounter for therapeutic drug level monitoring: Secondary | ICD-10-CM

## 2017-07-13 DIAGNOSIS — Z23 Encounter for immunization: Secondary | ICD-10-CM

## 2017-07-13 MED ORDER — LISINOPRIL 40 MG PO TABS: ORAL_TABLET | ORAL | 1 refills | Status: DC

## 2017-07-13 NOTE — Patient Instructions (Addendum)
I recommend getting the new shingles vaccine (Shingrix). You will need to check with your insurance to see if it is covered, and if covered by Medicare Part D, you need to get from the pharmacy rather than our office.  It is a series of 2 injections, spaced 2 months apart.  We will be in touch (through MyChart or by phone) in 1-2 days with the results.  Continue your current medications, be sure to stay well hydrated, and not take long, hot showers.  Moisturize frequently.

## 2017-07-14 ENCOUNTER — Telehealth: Payer: Self-pay | Admitting: *Deleted

## 2017-07-14 DIAGNOSIS — E559 Vitamin D deficiency, unspecified: Secondary | ICD-10-CM | POA: Insufficient documentation

## 2017-07-14 LAB — CBC WITH DIFFERENTIAL/PLATELET
BASOS: 1 %
Basophils Absolute: 0.1 10*3/uL (ref 0.0–0.2)
EOS (ABSOLUTE): 0.5 10*3/uL — ABNORMAL HIGH (ref 0.0–0.4)
EOS: 6 %
HEMATOCRIT: 44.9 % (ref 37.5–51.0)
Hemoglobin: 14.7 g/dL (ref 13.0–17.7)
IMMATURE GRANS (ABS): 0 10*3/uL (ref 0.0–0.1)
IMMATURE GRANULOCYTES: 0 %
LYMPHS: 26 %
Lymphocytes Absolute: 2.2 10*3/uL (ref 0.7–3.1)
MCH: 27.4 pg (ref 26.6–33.0)
MCHC: 32.7 g/dL (ref 31.5–35.7)
MCV: 84 fL (ref 79–97)
MONOS ABS: 0.6 10*3/uL (ref 0.1–0.9)
Monocytes: 7 %
NEUTROS PCT: 60 %
Neutrophils Absolute: 5.2 10*3/uL (ref 1.4–7.0)
Platelets: 214 10*3/uL (ref 150–379)
RBC: 5.37 x10E6/uL (ref 4.14–5.80)
RDW: 13.7 % (ref 12.3–15.4)
WBC: 8.7 10*3/uL (ref 3.4–10.8)

## 2017-07-14 LAB — COMPREHENSIVE METABOLIC PANEL
ALK PHOS: 75 IU/L (ref 39–117)
ALT: 28 IU/L (ref 0–44)
AST: 24 IU/L (ref 0–40)
Albumin/Globulin Ratio: 1.6 (ref 1.2–2.2)
Albumin: 4.4 g/dL (ref 3.6–4.8)
BUN/Creatinine Ratio: 12 (ref 10–24)
BUN: 13 mg/dL (ref 8–27)
Bilirubin Total: 0.5 mg/dL (ref 0.0–1.2)
CALCIUM: 9.5 mg/dL (ref 8.6–10.2)
CO2: 27 mmol/L (ref 20–29)
CREATININE: 1.13 mg/dL (ref 0.76–1.27)
Chloride: 103 mmol/L (ref 96–106)
GFR calc Af Amer: 77 mL/min/{1.73_m2} (ref >59)
GFR, EST NON AFRICAN AMERICAN: 67 mL/min/{1.73_m2} (ref >59)
GLUCOSE: 108 mg/dL — AB (ref 65–99)
Globulin, Total: 2.7 g/dL (ref 1.5–4.5)
Potassium: 5.1 mmol/L (ref 3.5–5.2)
Sodium: 143 mmol/L (ref 134–144)
Total Protein: 7.1 g/dL (ref 6.0–8.5)

## 2017-07-14 LAB — LIPID PANEL
CHOL/HDL RATIO: 4.2 ratio (ref 0.0–5.0)
Cholesterol, Total: 174 mg/dL (ref 100–199)
HDL: 41 mg/dL (ref >39)
LDL CALC: 107 mg/dL — AB (ref 0–99)
Triglycerides: 128 mg/dL (ref 0–149)
VLDL CHOLESTEROL CAL: 26 mg/dL (ref 5–40)

## 2017-07-14 LAB — TSH: TSH: 0.627 u[IU]/mL (ref 0.450–4.500)

## 2017-07-14 LAB — VITAMIN D 25 HYDROXY (VIT D DEFICIENCY, FRACTURES): Vit D, 25-Hydroxy: 16.8 ng/mL — ABNORMAL LOW (ref 30.0–100.0)

## 2017-07-14 LAB — TESTOSTERONE: Testosterone: 368 ng/dL (ref 264–916)

## 2017-07-14 LAB — MAGNESIUM: MAGNESIUM: 2.2 mg/dL (ref 1.6–2.3)

## 2017-07-14 MED ORDER — VITAMIN D (ERGOCALCIFEROL) 1.25 MG (50000 UNIT) PO CAPS: 50000.0000 [IU] | ORAL_CAPSULE | ORAL | 0 refills | Status: DC

## 2017-07-14 NOTE — Telephone Encounter (Signed)
Spoke with patient and he said he will think on it and to let you know he got into his My Chart and thanks for checking on that.

## 2017-07-14 NOTE — Addendum Note (Signed)
Addended byRita Ohara on: 07/14/2017 07:15 AM   Modules accepted: Orders

## 2017-07-14 NOTE — Telephone Encounter (Signed)
It is his choice as to whether he wants to follow-up there, or we can check his PSA (and prostate exam) at his physical scheduled for July. (and if abnormal, we will be sending him back there)

## 2017-07-14 NOTE — Telephone Encounter (Signed)
Called him and he not gone anywhere else. Erik Leon it has been a long year and guesses he was mistaken and time just flew by. Please advise, thanks.

## 2017-07-14 NOTE — Telephone Encounter (Signed)
Check with patient to see where he went recently (he stated he went more recently)--he was either mistaken, or perhaps he went somewhere else??

## 2017-07-14 NOTE — Telephone Encounter (Signed)
I called over to [facility] and the last time patient was seen was 10/23/15. We actually have that last note under media-he has not been seen there again and has no future appts.

## 2017-07-17 ENCOUNTER — Encounter: Payer: Self-pay | Admitting: Family Medicine

## 2017-10-28 ENCOUNTER — Other Ambulatory Visit: Payer: Self-pay | Admitting: Dermatology

## 2017-10-28 DIAGNOSIS — D229 Melanocytic nevi, unspecified: Secondary | ICD-10-CM | POA: Diagnosis not present

## 2017-10-28 DIAGNOSIS — L820 Inflamed seborrheic keratosis: Secondary | ICD-10-CM | POA: Diagnosis not present

## 2017-10-28 DIAGNOSIS — L720 Epidermal cyst: Secondary | ICD-10-CM | POA: Diagnosis not present

## 2017-10-28 DIAGNOSIS — D485 Neoplasm of uncertain behavior of skin: Secondary | ICD-10-CM | POA: Diagnosis not present

## 2017-11-26 ENCOUNTER — Other Ambulatory Visit: Payer: Self-pay | Admitting: Dermatology

## 2017-11-26 DIAGNOSIS — C44519 Basal cell carcinoma of skin of other part of trunk: Secondary | ICD-10-CM | POA: Diagnosis not present

## 2017-12-17 ENCOUNTER — Other Ambulatory Visit: Payer: Self-pay | Admitting: Dermatology

## 2017-12-17 DIAGNOSIS — L720 Epidermal cyst: Secondary | ICD-10-CM | POA: Diagnosis not present

## 2017-12-28 DIAGNOSIS — Z4802 Encounter for removal of sutures: Secondary | ICD-10-CM | POA: Diagnosis not present

## 2018-01-06 ENCOUNTER — Other Ambulatory Visit: Payer: Self-pay | Admitting: Family Medicine

## 2018-01-06 ENCOUNTER — Encounter: Payer: Self-pay | Admitting: Family Medicine

## 2018-01-06 DIAGNOSIS — E559 Vitamin D deficiency, unspecified: Secondary | ICD-10-CM

## 2018-01-06 NOTE — Progress Notes (Signed)
Chief Complaint  Patient presents with  . annual wellness    annual wellness, loses voice sometimes, ankle and leg swelling some, SOB, and rapid heart beating some.     Erik Leon is a 67 y.o. male who presents for annual wellness visit and follow-up on chronic medical conditions.  He has the following concerns:  Hypertension: He reports that his blood pressure is running 110-120/60's-70's. Denies headaches, side effects of medications (slight cough/throat clearing from ACEI, tolerable and unchanged x years, not bothersome), dizziness, chest pain. Denies edema (just occasional, at end of the day). h/o hypertrophic obstructive cardiomyopathy (mild).He had repeat echocardiogram in 02/2016 which showed normal LV size with mild LV hypertrophy. EF 60-65%. Normal RV size and systolic function. No significant valvular abnormalities. He has developed some shortness of breath in the late afternoon, when going up steps or up a hill.  Short-lived, better in about 10 seconds.  Doesn't interrupt activity. No problems playing tennis. While sitting at a desk he feels like his heart starts racing suddenly .  It is short-lived, feels like it is under the heart, last a few seconds.  He drinks a lot of coffee (12-15 cups/day).  He gets leg cramps at night 4-5 nights/week.  Previously complained of an "unusual amount of dryness" in his skin for at least 3 years, worsening, not related to cold weather. Nails have also been brittle recently, complaining of weight gain and increased fatigue. He reports being "unusually tired" in the last couple of months. He has gained weight in the same time frame, without any change in diet or exercise. Thyroid was checked and normal.    Today he reports that his skin improved, nails less brittle.  Tiredness continues.  Sleeps 4-5 hours/night. +stress, trouble shutting mind down. 10:30-11pm in bed, awakens 3:30 for bathroom, back to sleep, up 4-4:30 daily.  Lab Results   Component Value Date   TSH 0.627 07/13/2017   Vitamin D was checked and found to be very low at 16.8 in 06/2017.  He was put on 12 weeks of prescription vitamin D. Took it every Monday, only recently finished (last week).  Thinks he didn't start it right away, had gone out of town.    Impaired fasting glucose--sugar was 108 on his labs in January.  +sweets/carbs.  GERD: Takes Nexium daily. He now takes 2 of the OTC Nexium, once daily. No recurrent dysphagia, as long as food iscut small. Sometimes will have problems eating a sub (thick bread), so no longer eats the bread and meat together.H/o dilatation by Dr. Earlean Shawl. Plans to see Dr. Earlean Shawl before he reitres for another dilatation\  He had colonoscopy 07/02/17. 5 polyps were found and it was recommended he have colonoscopy repeated in 3 years (06/2020). Per [facility], 4/5 of the polyps were adenomatous.    H/o elevated PSA. He remains under the care of urology and had benign biopsies in the past. He was last seen in June, reports PSA was fine.  Records haven't been received (last records in chart are from 10/2015)  He has been seeing Dr. Adele Schilder regularly-- Epidermoid cyst removed from left neck last month. [place] removed from upper back/right shoulder in early June, along with removal of an irritated SK below the right eye in ay.  Immunization History  Administered Date(s) Administered  . Influenza, High Dose Seasonal PF 07/13/2017  . Pneumococcal Conjugate-13 02/21/2016  . Pneumococcal Polysaccharide-23 07/13/2017  . Tdap 10/16/2010  . Zoster 10/29/2011   Last colonoscopy: 06/2017, adenomatous  colon polyps and internal hemorrhoids.  F/u rec 3 years Last PSA: per urologist, done within the year Dentist: twice yearly (more recently, getting dentalwork) Ophtho: yearly (February), got new glasses Exercise:  Walks a lot on his job, getting 10-11K steps daily (walks jobsites).  Has a treadmill, plans to start. Restarted tennis 3 weeks  ago (not strenuous) Heavy lifting (>30#) at work, equipment.  Other doctors caring for patient: Ophtho: Dr. Bing Plume Dentist: Dr. Pablo Lawrence GI: Dr. Earlean Shawl Derm: Dr. Denna Haggard Podiatry: Dr. Amalia Hailey Cardiology: Dr. Radford Pax (many years ago) ENT: Dr. Lytle Michaels: [facility]: Dr. Louis Meckel  Depression Screen: negative Fall Screen: negative Functional status screen: some pain with walking due to heel fracture, and some SOB Mini-Cog: 3/5--on recheck of recall remembered 2/3 with a very long break between.  See epic for full screens  He has a living will and healthcare power of attorney. Changing  Past Medical History:  Diagnosis Date  . Allergy   . BCC (basal cell carcinoma of skin) 11/2017   upper back/right shoulder  . Calcaneal fracture 2014   right; s/p boot; reinjured in 2019  . Colon polyp   . Diverticulosis 6/09   rare L colon diverticuli  . Elevated PSA 10/2012   benign prostate biopsies 11/2012  . Esophageal stricture    s/p dilatation (Dr. Earlean Shawl)  . GERD (gastroesophageal reflux disease)   . Heart murmur   . Hypertension   . Hypertr obst cardiomyop    mild--Dr. Radford Pax (previously)  . Internal hemorrhoid 6/09  . Migraine age 8   resolved after nasal surgery    Past Surgical History:  Procedure Laterality Date  . CHOLECYSTECTOMY  5/03  . COLONOSCOPY  11/2007, 2011, 2014   Dr. Rudi Rummage again 2019  . ELBOW SURGERY Right 06/2013   bone spur; Dr. Percell Miller  . ESOPHAGOGASTRODUODENOSCOPY  2012   with dilatation.  Dr. Earlean Shawl  . LIPOMA EXCISION     Dr. Stanford Breed from L paramedian prevertebral space  . NASAL POLYP SURGERY  2008  . SHOULDER ARTHROSCOPY  12/03; 1/06   right; left    Social History   Socioeconomic History  . Marital status: Married    Spouse name: Not on file  . Number of children: Not on file  . Years of education: Not on file  . Highest education level: Not on file  Occupational History  . 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 Financial resource strain: Not on file  . Food insecurity:    Worry: Not on file    Inability: Not on file  . Transportation needs:    Medical: Not on file    Non-medical: Not on file  Tobacco Use  . Smoking status: Former Smoker    Last attempt to quit: 06/24/1983    Years since quitting: 34.5  . Smokeless tobacco: Never Used  Substance and Sexual Activity  . Alcohol use: Yes    Comment: 1 drink/month (scotch)  . Drug use: No  . Sexual activity: Yes    Partners: Female  Lifestyle  . Physical activity:    Days per week: Not on file    Minutes per session: Not on file  . Stress: Not on file  Relationships  . Social connections:    Talks on phone: Not on file    Gets together: Not on file    Attends religious service: Not on file    Active member of club or organization:  Not on file    Attends meetings of clubs or organizations: Not on file    Relationship status: Not on file  . Intimate partner violence:    Fear of current or ex partner: Not on file    Emotionally abused: Not on file    Physically abused: Not on file    Forced sexual activity: Not on file  Other Topics Concern  . Not on file  Social History Narrative   Divorced. 2 biologic children, a son and a daughter.  Adopted 3 children. 1 granddaughter Tanja [place], [place], born 06/2014)   Lives alone, no pets.    Family History  Problem Relation Age of Onset  . Hypertension Mother   . Dementia Mother   . Cancer Mother        bladder, metastatic  . Diabetes Neg Hx   . Prostate cancer Neg Hx   . Colon cancer Neg Hx     Outpatient Encounter Medications as of 01/07/2018  Medication Sig Note  . aspirin 81 MG tablet Take 81 mg by mouth daily.     Marland Kitchen esomeprazole (NEXIUM) 40 MG capsule Take 40 mg by mouth daily before breakfast.   07/19/2015: He is actually taking 2 of the OTC Nexium (getting from [facility])  . fexofenadine (ALLEGRA) 180 MG tablet Take 180 mg by mouth daily.   Marland Kitchen  lisinopril (PRINIVIL,ZESTRIL) 40 MG tablet TAKE 1 TABLET(40 MG) BY MOUTH DAILY   . [DISCONTINUED] lisinopril (PRINIVIL,ZESTRIL) 40 MG tablet TAKE 1 TABLET(40 MG) BY MOUTH DAILY   . [DISCONTINUED] diclofenac sodium (VOLTAREN) 1 % GEL Apply 2 g topically 4 (four) times daily. Rub into affected area of foot 2 to 4 times daily (Patient not taking: Reported on 07/13/2017)   . [DISCONTINUED] meloxicam (MOBIC) 7.5 MG tablet TAKE 1 TABLET BY MOUTH DAILY (Patient not taking: Reported on 07/13/2017)   . [DISCONTINUED] NON FORMULARY [facility]  Onychomycosis Nail Lacquer -  Fluconazole 2%, Terbinafine 1% DMSO Apply to affected nail once daily Qty. 120 gm 3 refills   . [DISCONTINUED] Vitamin D, Ergocalciferol, (DRISDOL) 50000 units CAPS capsule Take 1 capsule (50,000 Units total) by mouth every 7 (seven) days.    No facility-administered encounter medications on file as of 01/07/2018.     No Known Allergies   ROS: The patient denies anorexia, fever, headaches, vision loss, decreased hearing, ear pain, hoarseness, palpitations, dizziness, syncope, dyspnea on exertion, swelling, nausea, vomiting, diarrhea, constipation, abdominal pain, melena, hematochezia, hematuria, incontinence, erectile dysfunction, nocturia, weakened urine stream, dysuria, genital lesions, numbness, tingling, weakness, tremor, suspicious skin lesions, depression, anxiety, abnormal bleeding/bruising, or enlarged lymph nodes.  Sees derm (Dr. Denna Haggard) regularly, no skin concerns. L heel pain (related to bone spurs and second fracture) Only sleeps 4-5 hrs/night (long-term). Worsening fatigue, palpitations and some DOE as per HPI.    PHYSICAL EXAM:  BP 122/78   Pulse 78   Resp 16   Ht _0  (1.778 m)   Wt 239 lb 6.4 oz (108.6 kg)   SpO2 96%   BMI 34.35 kg/m    Wt Readings from Last 3 Encounters:  07/13/17 231 lb 6.4 oz (105 kg)  02/21/16 225 lb 9.6 oz (102.3 kg)  07/19/15 225 lb 9.6 oz (102.3 kg)   General  Appearance:  Alert, cooperative, no distress, appears stated age   Head:  Normocephalic, without obvious abnormality, atraumatic   Eyes:  PERRL, conjunctiva/corneas clear, EOM's intact, fundi benign   Ears:  Normal TM's and external ear canals  Nose:  Nares normal, mucosa normal, no drainage or sinus tenderness   Throat:  Lips, mucosa, and tongue normal; teeth and gums normal   Neck:  Supple, no lymphadenopathy; thyroid: no enlargement/ tenderness/nodules; no carotid bruit or JVD. WHSS L neck, and small firm area where cyst was recently removed  Back:  Spine nontender, no curvature, ROM normal, no CVA tenderness   Lungs:  Clear to auscultation bilaterally without wheezes, rales or ronchi; respirations unlabored   Chest Wall:  No tenderness or deformity   Heart:  Regular rate and rhythm, S1 and S2 normal, no rub or gallop. 3/6 SEM at L>R USB, and at apex   Breast Exam:  No chest wall tenderness, masses or gynecomastia   Abdomen:  Soft, non-tender, nondistended, normoactive bowel sounds, no masses, no hepatosplenomegaly   Genitalia:  Deferred to urologist.   Rectal:  Deferred to urologist.   Extremities:  No clubbing, cyanosis or edema   Pulses:  2+ and symmetric all extremities   Skin:  Skin color, texture, turgor normal, no rashes or lesions noted  Lymph nodes:  Cervical, supraclavicular, and axillary nodes normal   Neurologic:  CNII-XII intact, normal strength, sensation and gait; reflexes 2+ and symmetric throughout   Psych: Normal mood, affect, hygiene and grooming   ASSESSMENT/PLAN:  Medicare annual wellness visit, subsequent  Essential hypertension, benign - Plan: Comprehensive metabolic panel, lisinopril (PRINIVIL,ZESTRIL) 40 MG tablet, Ambulatory referral to Cardiology  Vitamin D deficiency - completed rx course. Advised to take 1000 IU daily  - Plan: VITAMIN D 25 Hydroxy (Vit-D Deficiency, Fractures)  Medication monitoring encounter - Plan: Comprehensive  metabolic panel, VITAMIN D 25 Hydroxy (Vit-D Deficiency, Fractures)  Gastroesophageal reflux disease, esophagitis presence not specified - controlled; plans to get EGD in near future; no significant dysphagia  Impaired fasting glucose - weight loss, daily exercise, cutting back on sweets/sugar/carbs recommended - Plan: HgB A1c, Comprehensive metabolic panel  Essential hypertension, benign - controlled; prefers to continue lisinopril rather than change to ARB (due to slight cough) - Plan: Comprehensive metabolic panel, lisinopril (PRINIVIL,ZESTRIL) 40 MG tablet, Ambulatory referral to Cardiology  Dyspnea on exertion - Plan: Ambulatory referral to Cardiology  Fatigue, unspecified type - not improved with replacement of vitamin D; Encouraged adequate sleep duration - Plan: Ambulatory referral to Cardiology  Palpitations - encouraged to cut back on caffeine intake - Plan: Ambulatory referral to Cardiology  Obesity (BMI 30.0-34.9) - counseled re: diet, exercise, risks   c-met, Vit D A1c    Recommended at least 30 minutes of aerobic activity at least 5 days/week, weight bearing exercise at least 2x/week; proper sunscreen use reviewed; healthy diet and alcohol recommendations (less than or equal to 2 drinks/day) reviewed; regular seatbelt use; changing batteries in smoke detectors. Immunization recommendations discussed. Continue yearly high dose flu shots.  Shingrix recommended, to get from pharmacy. Colonoscopy recommendations reviewed--UTD, due again 06/2020   MOST form filled out. Full Code, Full Care  F/u 6 mos--states he will lose 30#  At end of visit he mentioned that he will be going to [place]. No travel vaccine in system, has had some in the past.  Advised to look into--either get [place] travel vaccine info, and detailed itinerary, or go back to travel clinic/health dept (as we may possibly not have all needed vaccines anyway).    Please cut back on your caffeine intake (3-4/day).   It is very important that you get at least 5-8 glasses of non-caffeinated beverage daily, plus an extra glass of water  for every cup of caffeine. Your leg cramps and possibly other symptoms (including reflux) will improve with cutting back on caffeine and better hydration.  Long-term you will need to be taking a vitamin D supplement of 1000 IU daily--either a separate vitamin D3 or a multivitamin that contains this amount. You don't need to start this if/when taking a prescription D (don't know if you will need another prescription until I get the lab results).    It is very important that you get a minimum of 6-8 hours of sleep/night.   Medicare Attestation I have personally reviewed: The patient's medical and social history Their use of alcohol, tobacco or illicit drugs Their current medications and supplements The patient's functional ability including ADLs,fall risks, home safety risks, cognitive, and hearing and visual impairment Diet and physical activities Evidence for depression or mood disorders  The patient's weight, height, BMI, and visual acuity have been recorded in the chart.  I have made referrals, counseling, and provided education to the patient based on review of the above and I have provided the patient with a written personalized care plan for preventive services.

## 2018-01-07 ENCOUNTER — Encounter: Payer: Self-pay | Admitting: Family Medicine

## 2018-01-07 ENCOUNTER — Ambulatory Visit (INDEPENDENT_AMBULATORY_CARE_PROVIDER_SITE_OTHER): Payer: Medicare Other | Admitting: Family Medicine

## 2018-01-07 VITALS — BP 122/78 | HR 78 | Resp 16 | Ht 70.0 in | Wt 239.4 lb

## 2018-01-07 DIAGNOSIS — E669 Obesity, unspecified: Secondary | ICD-10-CM

## 2018-01-07 DIAGNOSIS — R002 Palpitations: Secondary | ICD-10-CM | POA: Diagnosis not present

## 2018-01-07 DIAGNOSIS — Z0000 Encounter for general adult medical examination without abnormal findings: Secondary | ICD-10-CM

## 2018-01-07 DIAGNOSIS — I10 Essential (primary) hypertension: Secondary | ICD-10-CM | POA: Diagnosis not present

## 2018-01-07 DIAGNOSIS — K219 Gastro-esophageal reflux disease without esophagitis: Secondary | ICD-10-CM | POA: Diagnosis not present

## 2018-01-07 DIAGNOSIS — R5383 Other fatigue: Secondary | ICD-10-CM

## 2018-01-07 DIAGNOSIS — Z5181 Encounter for therapeutic drug level monitoring: Secondary | ICD-10-CM | POA: Diagnosis not present

## 2018-01-07 DIAGNOSIS — R7301 Impaired fasting glucose: Secondary | ICD-10-CM

## 2018-01-07 DIAGNOSIS — R0609 Other forms of dyspnea: Secondary | ICD-10-CM | POA: Diagnosis not present

## 2018-01-07 DIAGNOSIS — E559 Vitamin D deficiency, unspecified: Secondary | ICD-10-CM

## 2018-01-07 DIAGNOSIS — E66811 Obesity, class 1: Secondary | ICD-10-CM

## 2018-01-07 LAB — POCT GLYCOSYLATED HEMOGLOBIN (HGB A1C): Hemoglobin A1C: 5.5 % (ref 4.0–5.6)

## 2018-01-07 MED ORDER — LISINOPRIL 40 MG PO TABS: ORAL_TABLET | ORAL | 1 refills | Status: DC

## 2018-01-07 NOTE — Patient Instructions (Addendum)
HEALTH MAINTENANCE RECOMMENDATIONS:  It is recommended that you get at least 30 minutes of aerobic exercise at least 5 days/week (for weight loss, you may need as much as 60-90 minutes). This can be any activity that gets your heart rate up. This can be divided in 10-15 minute intervals if needed, but try and build up your endurance at least once a week.  Weight bearing exercise is also recommended twice weekly.  Eat a healthy diet with lots of vegetables, fruits and fiber.  "Colorful" foods have a lot of vitamins (ie green vegetables, tomatoes, red peppers, etc).  Limit sweet tea, regular sodas and alcoholic beverages, all of which has a lot of calories and sugar.  Up to 2 alcoholic drinks daily may be beneficial for men (unless trying to lose weight, watch sugars).  Drink a lot of water.  Sunscreen of at least SPF 30 should be used on all sun-exposed parts of the skin when outside between the hours of 10 am and 4 pm (not just when at beach or pool, but even with exercise, golf, tennis, and yard work!)  Use a sunscreen that says "broad spectrum" so it covers both UVA and UVB rays, and make sure to reapply every 1-2 hours.  Remember to change the batteries in your smoke detectors when changing your clock times in the spring and fall.  Use your seat belt every time you are in a car, and please drive safely and not be distracted with cell phones and texting while driving.   Mr. Waterhouse , Thank you for taking time to come for your Medicare Wellness Visit. I appreciate your ongoing commitment to your health goals. Please review the following plan we discussed and let me know if I can assist you in the future.   These are the goals we discussed: Goals    None      This is a list of the screening recommended for you and due dates:  Health Maintenance  Topic Date Due  . Flu Shot  01/21/2018  . Colon Cancer Screening  07/02/2020  . Tetanus Vaccine  10/15/2020  .  Hepatitis C: One time screening is  recommended by Center for Disease Control  (CDC) for  adults born from [birth year] through 1966.   Completed  . Pneumonia vaccines  Completed   I recommend getting the new shingles vaccine (Shingrix). You will need to check with your insurance to see if it is covered, and if covered by Medicare Part D, you need to get from the pharmacy rather than our office.  It is a series of 2 injections, spaced 2 months apart.   Please cut back on your caffeine intake (3-4/day).  It is very important that you get at least 5-8 glasses of non-caffeinated beverage daily, plus an extra glass of water for every cup of caffeine. Your leg cramps and possibly other symptoms (including reflux) will improve with cutting back on caffeine and better hydration.  Long-term you will need to be taking a vitamin D supplement of 1000 IU daily--either a separate vitamin D3 or a multivitamin that contains this amount. You don't need to start this if/when taking a prescription D (don't know if you will need another prescription until I get the lab results).    It is very important that you get a minimum of 6-8 hours of sleep/night.  You likely should consult a travel clinic regarding your upcoming travel--preferably where you got your prior vaccines. I have no records of your  prior travel vaccinations.

## 2018-01-08 LAB — COMPREHENSIVE METABOLIC PANEL
ALBUMIN: 4.1 g/dL (ref 3.6–4.8)
ALK PHOS: 66 IU/L (ref 39–117)
ALT: 23 IU/L (ref 0–44)
AST: 19 IU/L (ref 0–40)
Albumin/Globulin Ratio: 1.7 (ref 1.2–2.2)
BILIRUBIN TOTAL: 0.4 mg/dL (ref 0.0–1.2)
BUN/Creatinine Ratio: 13 (ref 10–24)
BUN: 14 mg/dL (ref 8–27)
CO2: 25 mmol/L (ref 20–29)
CREATININE: 1.09 mg/dL (ref 0.76–1.27)
Calcium: 9.2 mg/dL (ref 8.6–10.2)
Chloride: 101 mmol/L (ref 96–106)
GFR calc non Af Amer: 69 mL/min/{1.73_m2} (ref >59)
GFR, EST AFRICAN AMERICAN: 80 mL/min/{1.73_m2} (ref >59)
GLUCOSE: 107 mg/dL — AB (ref 65–99)
Globulin, Total: 2.4 g/dL (ref 1.5–4.5)
Potassium: 4.7 mmol/L (ref 3.5–5.2)
Sodium: 140 mmol/L (ref 134–144)
Total Protein: 6.5 g/dL (ref 6.0–8.5)

## 2018-01-08 LAB — VITAMIN D 25 HYDROXY (VIT D DEFICIENCY, FRACTURES): Vit D, 25-Hydroxy: 35.9 ng/mL (ref 30.0–100.0)

## 2018-01-11 NOTE — Progress Notes (Signed)
Called and his last visit was 5/17, same as when I called over there last year.

## 2018-01-13 NOTE — Progress Notes (Signed)
Called patient to verify and he states that he still goes there. I explained that I was trying to get records and the last one they have was from 10/2015. He stated that he thought he went in 2018 but he would have to check his calendar. He did confirm that he had not gone at all in 2019.

## 2018-01-22 ENCOUNTER — Other Ambulatory Visit: Payer: Self-pay | Admitting: Family Medicine

## 2018-01-22 DIAGNOSIS — I10 Essential (primary) hypertension: Secondary | ICD-10-CM

## 2018-02-01 ENCOUNTER — Ambulatory Visit (INDEPENDENT_AMBULATORY_CARE_PROVIDER_SITE_OTHER): Payer: Medicare Other | Admitting: Cardiology

## 2018-02-01 ENCOUNTER — Encounter: Payer: Self-pay | Admitting: Cardiology

## 2018-02-01 VITALS — BP 124/72 | HR 71 | Ht 70.0 in | Wt 224.0 lb

## 2018-02-01 DIAGNOSIS — Z8241 Family history of sudden cardiac death: Secondary | ICD-10-CM

## 2018-02-01 DIAGNOSIS — R072 Precordial pain: Secondary | ICD-10-CM

## 2018-02-01 DIAGNOSIS — Z8489 Family history of other specified conditions: Secondary | ICD-10-CM | POA: Diagnosis not present

## 2018-02-01 DIAGNOSIS — Z7189 Other specified counseling: Secondary | ICD-10-CM

## 2018-02-01 DIAGNOSIS — I421 Obstructive hypertrophic cardiomyopathy: Secondary | ICD-10-CM | POA: Diagnosis not present

## 2018-02-01 DIAGNOSIS — I422 Other hypertrophic cardiomyopathy: Secondary | ICD-10-CM | POA: Insufficient documentation

## 2018-02-01 NOTE — Patient Instructions (Signed)
Medication Instructions:  Your physician recommends that you continue on your current medications as directed. Please refer to the Current Medication list given to you today.   Labwork: Your physician recommends that you return for lab work in: 1 Week before test (BMET)   Testing/Procedures: Your physician has requested that you have cardiac CT. Cardiac computed tomography (CT) is a painless test that uses an x-ray machine to take clear, detailed pictures of your heart. For further information please visit [facility]. Please follow instruction sheet as given.  Please arrive at the [facility] main entrance of [facility] at___________________ (30-45 minutes prior to test start time)  [facility] [place], [place] [address] [phone]  Proceed to the [facility] Radiology Department (First Floor).  Please follow these instructions carefully (unless otherwise directed):   On the Night Before the Test: . Drink plenty of water. . Do not consume any caffeinated/decaffeinated beverages or chocolate 12 hours prior to your test. . Do not take any antihistamines 12 hours prior to your test.  On the Day of the Test: . Drink plenty of water. Do not drink any water within one hour of the test. . Do not eat any food 4 hours prior to the test. . You may take your regular medications prior to the test.  After the Test: . Drink plenty of water. . After receiving IV contrast, you may experience a mild flushed feeling. This is normal. . On occasion, you may experience a mild rash up to 24 hours after the test. This is not dangerous. If this occurs, you can take Benadryl 25 mg and increase your fluid intake. . If you experience trouble breathing, this can be serious. If it is severe call 911 IMMEDIATELY. If it is mild, please call our office.   Follow-Up: Your physician wants you to follow-up in: 1 year with Dr. Harrell Gave. You will receive a  reminder letter in the mail two months in advance. If you don't receive a letter, please call our office to schedule the follow-up appointment.   Any Other Special Instructions Will Be Listed Below (If Applicable).     If you need a refill on your cardiac medications before your next appointment, please call your pharmacy.

## 2018-02-01 NOTE — Progress Notes (Signed)
Cardiology Office Note:    Date:  02/01/2018   ID:  Erik Leon, DOB September 11, 1950, MRN [id number]  PCP:  Rita Ohara, MD  Cardiologist:  Buford Dresser, MD PhD  Referring MD: Rita Ohara, MD   Chief Complaint  Patient presents with  . New Patient (Initial Visit)    pt c/o SOB when going up steps; intermittent pain "under the heart" on the left side; occasional cramps in legs at night when laying in bed    History of Present Illness:    Erik Leon is a 67 y.o. male with a hx of hypertension and mild HOCM who is seen as a new consult at the request of Dr. Tomi Bamberger for evaluation of chest pain.  Chest pain: intermittent. No aggravating factors. Last 10 seconds. If he exerts himself, feels that his pulse goes faster, but doesn't get discomfort. Started several years ago. Happens once every couple months. Last occurrence two weeks ago, occurred while sitting at his desk. Feels like when "you swallow something and it goes down the wrong pipe." Feels like it is under his heart. No radiation, no associated symptoms.  He reports multiple prior stress tests, though I do not have the records. Noted as stress in Epic in 2013, but only normal echo results attached.  Risk factors: Htn: well controlled based on home numbers Weight/lifestyle: Has lost 15 lbs but cutting out extra carbs. 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 Former smoker (distant past).  DM: none. A1c 5.5 FH: no known CAD. Brother with congenital cardiac defect (below).  HOCM: echo in 2013 pdf reports SAM, though I cannot see additional details. Noted as mild LVH, I do not have measurements or gradients. Echo here in 2017 noted septal thickness as 1.5 cm and posterior wall thickness as 1.37 cm. No gradient noted.  No syncope. Had a half brother pass from what sounds like SCD, but he reportedly had a congenital cardiac defect, was not HCM as far as patient knows.  No PND, orthopnea, syncope, LE  edema.  Past Medical History:  Diagnosis Date  . Allergy   . BCC (basal cell carcinoma of skin) 11/2017   upper back/right shoulder  . Calcaneal fracture 2014   right; s/p boot; reinjured in 2019  . Colon polyp   . Diverticulosis 6/09   rare L colon diverticuli  . Elevated PSA 10/2012   benign prostate biopsies 11/2012  . Esophageal stricture    s/p dilatation (Dr. Earlean Shawl)  . GERD (gastroesophageal reflux disease)   . Heart murmur   . Hypertension   . Hypertr obst cardiomyop    mild--Dr. Radford Pax (previously)  . Internal hemorrhoid 6/09  . Migraine age 74   resolved after nasal surgery    Past Surgical History:  Procedure Laterality Date  . CHOLECYSTECTOMY  5/03  . COLONOSCOPY  11/2007, 2011, 2014   Dr. Rudi Rummage again 2019  . ELBOW SURGERY Right 06/2013   bone spur; Dr. Percell Miller  . ESOPHAGOGASTRODUODENOSCOPY  2012   with dilatation.  Dr. Earlean Shawl  . LIPOMA EXCISION     Dr. Stanford Breed from L paramedian prevertebral space  . NASAL POLYP SURGERY  2008  . SHOULDER ARTHROSCOPY  12/03; 1/06   right; left    Current Medications: Current Outpatient Medications on File Prior to Visit  Medication Sig  . aspirin 81 MG tablet Take 81 mg by mouth daily.    Marland Kitchen esomeprazole (NEXIUM) 40 MG capsule Take 40 mg by mouth  daily before breakfast.    . fexofenadine (ALLEGRA) 180 MG tablet Take 180 mg by mouth daily.  Marland Kitchen lisinopril (PRINIVIL,ZESTRIL) 40 MG tablet TAKE 1 TABLET(40 MG) BY MOUTH DAILY   No current facility-administered medications on file prior to visit.      Allergies:   Patient has no known allergies.   Social History   Socioeconomic History  . Marital status: Married    Spouse name: Not on file  . Number of children: Not on file  . Years of education: Not on file  . Highest education level: Not on file  Occupational History  . 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 Financial resource strain: Not on file  . Food  insecurity:    Worry: Not on file    Inability: Not on file  . Transportation needs:    Medical: Not on file    Non-medical: Not on file  Tobacco Use  . Smoking status: Former Smoker    Last attempt to quit: 06/24/1983    Years since quitting: 34.6  . Smokeless tobacco: Never Used  Substance and Sexual Activity  . Alcohol use: Yes    Comment: 1 drink/month (scotch)  . Drug use: No  . Sexual activity: Yes    Partners: Female  Lifestyle  . Physical activity:    Days per week: Not on file    Minutes per session: Not on file  . Stress: Not on file  Relationships  . Social connections:    Talks on phone: Not on file    Gets together: Not on file    Attends religious service: Not on file    Active member of club or organization: Not on file    Attends meetings of clubs or organizations: Not on file    Relationship status: Not on file  Other Topics Concern  . Not on file  Social History Narrative   Divorced. 2 biologic children, a son and a daughter.  Adopted 3 children. 1 granddaughter Tanja [place], [place], born 06/2014)   Lives alone, no pets.     Family History: The patient's family history includes Cancer in his mother; Dementia in his mother; Hypertension in his mother. There is no history of Diabetes, Prostate cancer, or Colon cancer. Half-brother had a birth defect and died after playing handball at age 58. Not HCM as far as he knows. There were 11 sons, no other with heart problems. Not sure about his dad's history, he was an alcoholic. No MI or stroke.  ROS:   Please see the history of present illness.  Additional pertinent ROS: Review of Systems  Constitutional: Negative for chills and fever.  HENT: Negative for ear pain and hearing loss.   Eyes: Negative for blurred vision and pain.  Respiratory: Negative for shortness of breath and wheezing.   Cardiovascular: Positive for chest pain. Negative for orthopnea, claudication, leg swelling and PND.  Gastrointestinal: Negative  for abdominal pain and blood in stool.  Genitourinary: Negative for dysuria and hematuria.  Musculoskeletal: Negative for falls and myalgias.  Skin: Negative for rash.  Neurological: Negative for focal weakness and loss of consciousness.  Endo/Heme/Allergies: Does not bruise/bleed easily.    EKGs/Labs/Other Studies Reviewed:    The following studies were reviewed today: Echo 2013: (pdf scan)--noted as stress, but only resting echo in note EF >70%, mild cLVH. +SAM, trace MR  Echo 2017:  Study Conclusions - Left ventricle: The cavity size was normal.  Wall thickness was   increased in a pattern of mild LVH. Systolic function was normal.   The estimated ejection fraction was in the range of 60% to 65%.   Wall motion was normal; there were no regional wall motion   abnormalities. Doppler parameters are consistent with abnormal   left ventricular relaxation (grade 1 diastolic dysfunction). - Aortic valve: There was no stenosis. - Mitral valve: There was trivial regurgitation. - Right ventricle: The cavity size was normal. Systolic function   was normal. - Pulmonary arteries: No complete TR doppler jet so unable to   estimate PA systolic pressure. - Inferior vena cava: The vessel was normal in size. The   respirophasic diameter changes were in the normal range (>= 50%),   consistent with normal central venous pressure.  Impressions:  - Normal LV size with mild LV hypertrophy. EF 60-65%. Normal RV   size and systolic function. No significant valvular   abnormalities.  EKG:  Prior ECG in 2017 NSR  Recent Labs: 07/13/2017: Hemoglobin 14.7; Magnesium 2.2; Platelets 214; TSH 0.627 01/07/2018: ALT 23; BUN 14; Creatinine, Ser 1.09; Potassium 4.7; Sodium 140  Recent Lipid Panel    Component Value Date/Time   CHOL 174 07/13/2017 1145   TRIG 128 07/13/2017 1145   HDL 41 07/13/2017 1145   CHOLHDL 4.2 07/13/2017 1145   CHOLHDL 4.0 02/21/2016 1035   VLDL 22 02/21/2016 1035   LDLCALC  107 (H) 07/13/2017 1145    Physical Exam:    VS:  BP 124/72 (BP Location: Left Arm, Patient Position: Sitting, Cuff Size: Large)   Pulse 71   Ht 5\' 10"  (1.778 m)   Wt 224 lb (101.6 kg)   BMI 32.14 kg/m     Wt Readings from Last 3 Encounters:  02/01/18 224 lb (101.6 kg)  01/07/18 239 lb 6.4 oz (108.6 kg)  07/13/17 231 lb 6.4 oz (105 kg)    GEN: Well nourished, well developed in no acute distress HEENT: Normal NECK: No JVD; No carotid bruits LYMPHATICS: No lymphadenopathy CARDIAC: regular rhythm, normal S1 and S2, no rubs, gallops. 2/6 systolic murmur at RUSB, increases with valsalva. Radial and DP pulses 2+ bilaterally. RESPIRATORY:  Clear to auscultation without rales, wheezing or rhonchi  ABDOMEN: Soft, non-tender, non-distended MUSCULOSKELETAL:  No edema; No deformity  SKIN: Warm and dry NEUROLOGIC:  Alert and oriented x 3 PSYCHIATRIC:  Normal affect   ASSESSMENT:    1. Precordial pain   2. Mild HOCM (hypertrophic obstructive cardiomyopathy) ([place])   3. Counseling on health promotion and disease prevention   4. Family history of sudden cardiac death in brother   73. Family health problem    PLAN:    1. Chest pain: not typical for angina, rare, not related to exertion -reports prior stress tests, though I cannot see it.  -discussed options for testing. Would prefer coronary CTA, as this will help [place] also determine presence of calcifications/plaque for risk stratification. Low resting heart rate, can receive metoprolol if needed. No history of obstructive symptoms with his mild HOCM, should be able to receive nitroglycerin but would aim for lowest dose necessary for testing.  2. HOCM-mild murmur, not noted on prior echo report. When I reviewed his echo images, I did not see any SAM. Predominantly focal septal hypertrophy. Max LVOT gradient noted on imaging was 12 mmHg. No Valsalva done. Asymptomatic. 79 brother had SCD but apparently had congenital heart issue, not thought  to be HCM per patient -asymptomatic, no syncope, no SCD  in family related to HCM (though more information on half-brother's condition would be helpful). -had echo in 2017, will repeat in the future based on symptoms vs. Routine monitoring  3. Prevention -elevated risk per ASCVD risk score (below). Wished to have coronary CTA for chest pain. Based on his results, will discuss recommendations for statins.  The 10-year ASCVD risk score Mikey Bussing DC Brooke Bonito., et al., 2013) is: 17.6%   Values used to calculate the score:     Age: 36 years     Sex: Male     Is Non-Hispanic African American: No     Diabetic: No     Tobacco smoker: No     Systolic Blood Pressure: 951 mmHg     Is BP treated: Yes     HDL Cholesterol: 41 mg/dL     Total Cholesterol: 174 mg/dL  Plan for follow up: based on results of testing. Will need to discuss prevention/statin based on result of coronary CTA. At a minimum will need 1 year follow up, possibly sooner pending results.  Medication Adjustments/Labs and Tests Ordered: Current medicines are reviewed at length with the patient today.  Concerns regarding medicines are outlined above.  Orders Placed This Encounter  Procedures  . CT CORONARY MORPH W/CTA COR W/SCORE W/CA W/CM &/OR WO/CM  . CT CORONARY FRACTIONAL FLOW RESERVE DATA PREP  . CT CORONARY FRACTIONAL FLOW RESERVE FLUID ANALYSIS  . Basic metabolic panel   No orders of the defined types were placed in this encounter.   Patient Instructions  Medication Instructions:  Your physician recommends that you continue on your current medications as directed. Please refer to the Current Medication list given to you today.   Labwork: Your physician recommends that you return for lab work in: 1 Week before test (BMET)   Testing/Procedures: Your physician has requested that you have cardiac CT. Cardiac computed tomography (CT) is a painless test that uses an x-ray machine to take clear, detailed pictures of your heart. For  further information please visit [facility]uge[facility]tn. Please follow instruction sheet as given.  Please arrive at the [facility] main entrance of [facility] at___________________ (30-45 minutes prior to test start time)  [facility] [place], [place] [address] [phone]  Proceed to the [facility] Radiology Department (First Floor).  Please follow these instructions carefully (unless otherwise directed):   On the Night Before the Test: . Drink plenty of water. . Do not consume any caffeinated/decaffeinated beverages or chocolate 12 hours prior to your test. . Do not take any antihistamines 12 hours prior to your test.  On the Day of the Test: . Drink plenty of water. Do not drink any water within one hour of the test. . Do not eat any food 4 hours prior to the test. . You may take your regular medications prior to the test.  After the Test: . Drink plenty of water. . After receiving IV contrast, you may experience a mild flushed feeling. This is normal. . On occasion, you may experience a mild rash up to 24 hours after the test. This is not dangerous. If this occurs, you can take Benadryl 25 mg and increase your fluid intake. . If you experience trouble breathing, this can be serious. If it is severe call 911 IMMEDIATELY. If it is mild, please call our office.   Follow-Up: Your physician wants you to follow-up in: 1 year with Dr. Harrell Gave. You will receive a reminder letter in the mail two  months in advance. If you don't receive a letter, please call our office to schedule the follow-up appointment.   Any Other Special Instructions Will Be Listed Below (If Applicable).     If you need a refill on your cardiac medications before your next appointment, please call your pharmacy.      Signed, Buford Dresser, MD PhD 02/01/2018 11:28 AM    [place]

## 2018-02-23 DIAGNOSIS — Z840 Family history of diseases of the skin and subcutaneous tissue: Secondary | ICD-10-CM | POA: Diagnosis not present

## 2018-02-23 DIAGNOSIS — Z8489 Family history of other specified conditions: Secondary | ICD-10-CM | POA: Diagnosis not present

## 2018-02-23 DIAGNOSIS — I200 Unstable angina: Secondary | ICD-10-CM | POA: Diagnosis not present

## 2018-02-23 LAB — BASIC METABOLIC PANEL
BUN / CREAT RATIO: 14 (ref 10–24)
BUN: 20 mg/dL (ref 8–27)
CO2: 24 mmol/L (ref 20–29)
Calcium: 9.3 mg/dL (ref 8.6–10.2)
Chloride: 103 mmol/L (ref 96–106)
Creatinine, Ser: 1.45 mg/dL — ABNORMAL HIGH (ref 0.76–1.27)
GFR calc Af Amer: 57 mL/min/{1.73_m2} — ABNORMAL LOW (ref >59)
GFR calc non Af Amer: 49 mL/min/{1.73_m2} — ABNORMAL LOW (ref >59)
GLUCOSE: 100 mg/dL — AB (ref 65–99)
Potassium: 5.1 mmol/L (ref 3.5–5.2)
Sodium: 139 mmol/L (ref 134–144)

## 2018-03-01 ENCOUNTER — Telehealth: Payer: Self-pay | Admitting: *Deleted

## 2018-03-01 NOTE — Telephone Encounter (Signed)
Patient advised.

## 2018-03-01 NOTE — Telephone Encounter (Signed)
Hard to see what is causing his discomfort without exam. Recommend hydrating well, and scheduling OV here if pain persists.

## 2018-03-01 NOTE — Telephone Encounter (Signed)
Patient called and stated that he has been having some pain between his ribs and hips on right side. He had some labs done the other day (by another provider) and his serum creatinine was elevated. Was calling to see what your advice would be? Could this be related to his kidneys? Who should he see, what should he do?

## 2018-03-03 ENCOUNTER — Ambulatory Visit (HOSPITAL_COMMUNITY)
Admission: RE | Admit: 2018-03-03 | Discharge: 2018-03-03 | Disposition: A | Discharge status: Home / Self Care | Payer: Medicare Other | Source: Ambulatory Visit | Attending: Cardiology | Admitting: Cardiology

## 2018-03-03 DIAGNOSIS — R072 Precordial pain: Secondary | ICD-10-CM | POA: Diagnosis not present

## 2018-03-03 DIAGNOSIS — I2510 Atherosclerotic heart disease of native coronary artery without angina pectoris: Secondary | ICD-10-CM | POA: Diagnosis not present

## 2018-03-03 DIAGNOSIS — Z8489 Family history of other specified conditions: Secondary | ICD-10-CM | POA: Diagnosis not present

## 2018-03-03 MED ORDER — NITROGLYCERIN 0.4 MG SL SUBL
0.8000 mg | SUBLINGUAL_TABLET | Freq: Once | SUBLINGUAL | Status: AC
  Administered 2018-03-03: 0.8 mg via SUBLINGUAL
  Filled 2018-03-03: qty 25

## 2018-03-03 MED ORDER — IOPAMIDOL (ISOVUE-370) INJECTION 76%
100.0000 mL | Freq: Once | INTRAVENOUS | Status: AC | PRN
  Administered 2018-03-03: 100 mL via INTRAVENOUS

## 2018-03-03 MED ORDER — NITROGLYCERIN 0.4 MG SL SUBL
SUBLINGUAL_TABLET | SUBLINGUAL | Status: AC
  Administered 2018-03-03: 0.8 mg via SUBLINGUAL
  Filled 2018-03-03: qty 2

## 2018-03-03 NOTE — Progress Notes (Signed)
Left message reminding patient

## 2018-03-04 ENCOUNTER — Other Ambulatory Visit: Payer: Self-pay

## 2018-03-04 DIAGNOSIS — Z79899 Other long term (current) drug therapy: Secondary | ICD-10-CM

## 2018-03-04 MED ORDER — ATORVASTATIN CALCIUM 40 MG PO TABS: 40.0000 mg | ORAL_TABLET | Freq: Every day | ORAL | 3 refills | Status: DC

## 2018-03-05 ENCOUNTER — Encounter: Payer: Self-pay | Admitting: Family Medicine

## 2018-03-07 NOTE — Progress Notes (Signed)
Chief Complaint  Patient presents with  . Flank Pain    complains of pain between ribs and hips B/L x 10 days right is worse than L.   . Other    he has appt scheduled with urology for routine visit in January.     He emailed 9/9 reporting pain below his ribs and above his waist on the right side. Last email 9/13 stated that pain is in the 6 to 7 range.  He was concerned about his kidneys, as b-met done by cardiologist showed elevated Cr.  He has been trying to stay well hydrated.   Per 9/13 message, he reported that his urine appears darker, and he was cramping for the last several nights in inner thigh, legs, arch of feet/toes. He reported feeling a little lethargic, but attribute that to "a ton of business and not enough time". He also noted bruising more severely, than a normal bruise.  Hasn't taken any NSAIDs, just 93m of aspirin.  Since my MyChart reply, he drank a lot more water since Saturday, cramping is less and urine is lighter. Pain is unchanged.  Pain started in the middle of the night. Has been steady pain, no variability. No known injury, trauma, change in activity. Hasn't tried heat.  Ice pack didn't help.  He doesn't think it feels muscular.  Not sore to touch.  Rubbing/massaging helps only temporarily.  No changes in bowels (going less often since diet change).   +intentional weight loss  Review of chart: He saw Dr. CHarrell Gave(cardiologist).  10-year ASCVD risk score is 17.6%. Coronary CTA was performed: IMPRESSION: 1. Coronary artery calcium score 198 Agatston units. This places the patient in the 65th percentile for age and gender, suggesting intermediate risk for future cardiac events. 2. Extensive plaque in the proximal and mid LAD. Appears to be mild stenosis. 3.  Mild stenosis in distal RCA/PLV. It was sent for FFR:  No hemodynamically significant disease.  He was advised by cardiologist: His coronary CT showed moderate amounts of calcium overall, which  means he is at intermediate risk for future heart disease. There is a lot of cholesterol buildup already in the main artery of the heart. It isn't narrow enough right now that it needs a stent, but we need to be aggressive with prevention medications to prevent any further buildup. Based on his risk score and CT scan results, I would start atorvastatin 40 mg every night. If he is willing to go on the statin, he should have a repeat lipid panel in 8 weeks. I would be happy to see him back in clinic sooner if he'd like to talk about prevention in person, or otherwise I would see him back 6 months after he starts statin therapy. Lab Results  Component Value Date   CHOL 174 07/13/2017   HDL 41 07/13/2017   LDLCALC 107 (H) 07/13/2017   TRIG 128 07/13/2017   CHOLHDL 4.2 07/13/2017   He had b-met done after CTA:   Chemistry      Component Value Date/Time   NA 139 02/23/2018 1142   K 5.1 02/23/2018 1142   CL 103 02/23/2018 1142   CO2 24 02/23/2018 1142   BUN 20 02/23/2018 1142   CREATININE 1.45 (H) 02/23/2018 1142   CREATININE 1.12 07/19/2015 0001      Component Value Date/Time   CALCIUM 9.3 02/23/2018 1142   ALKPHOS 66 01/07/2018 1451   AST 19 01/07/2018 1451   ALT 23 01/07/2018 1451   BILITOT  0.4 01/07/2018 1451     Creatinine done 01/07/18 was 1.09.  He reports he has not yet started the lipitor (paperwork mentioned something about the kidneys, and he was scared to start)  PMH, [place], [place] reviewed  Outpatient Encounter Medications as of 03/08/2018  Medication Sig Note  . aspirin 81 MG tablet Take 81 mg by mouth daily.     . cholecalciferol (VITAMIN D) 1000 units tablet Take 1,000 Units by mouth daily.   Marland Kitchen esomeprazole (NEXIUM) 40 MG capsule Take 40 mg by mouth daily before breakfast.   07/19/2015: He is actually taking 2 of the OTC Nexium (getting from [place])  . fexofenadine (ALLEGRA) 180 MG tablet Take 180 mg by mouth daily.   Marland Kitchen lisinopril (PRINIVIL,ZESTRIL) 40 MG tablet TAKE 1  TABLET(40 MG) BY MOUTH DAILY   . atorvastatin (LIPITOR) 40 MG tablet Take 1 tablet (40 mg total) by mouth daily at 6 PM. (Patient not taking: Reported on 03/08/2018)    No facility-administered encounter medications on file as of 03/08/2018.    Allergies  Allergen Reactions  . Coconut Fatty Acids Swelling   ROS: No nausea, vomiting, fever, chills, chest pain. No constipation, diarrhea, bowel changes (just smaller amount and less frequent since diet change).  Occasional mild swelling in ankles, related to salt intake (bacon, popcorn).  No urinary symptoms, gets up just once/night.  Denies hematuria, weakened stream, dysuria.   PHYSICAL EXAM:  BP 122/72   Pulse 68   Temp (!) 97.1 F (36.2 C) (Tympanic)   Ht 5' 10" (1.778 m)   Wt 224 lb 6.4 oz (101.8 kg)   BMI 32.20 kg/m    Wt Readings from Last 3 Encounters:  03/08/18 224 lb 6.4 oz (101.8 kg)  02/01/18 224 lb (101.6 kg)  01/07/18 239 lb 6.4 oz (108.6 kg)   Well appearing male, in no distress HEENT: conjunctiva and sclera are clear, EOMI, OP clear Neck: No lymphadenopathy or mass Heart: regular rate and rhythm Lungs: clear bilaterally Back: no spinal or CVA tenderness.  Area of discomfort is right low back--below the ribs, muscular. Abdomen: mildly tender on R abdomen, no mass, rebound or guarding. Negative Murphy sign. Extremities: no edema Psych: normal mood, affect, hygiene and grooming Neuro: alert and oriented, cranial nerves intact, normal gait  Urine dip negative --no blood. SG    ASSESSMENT/PLAN:  Flank pain - pain is lower than flank, muscular. Reassured re: pain--trial heat, shown stretches, Tylenol (NSAIDs only if Cr normal) - Plan: POCT Urinalysis DIP (Proadvantage Device), CBC with Differential/Platelet, Basic metabolic panel  Easy bruising - only in areas of trauma, not spontaneous, likely related to his aspirin use. Will check CBC - Plan: CBC with Differential/Platelet  Elevated serum creatinine - sounds  as though he was dehydrated earlier in the weekend. recheck today and encouraged continued hydration efforts. - Plan: Basic metabolic panel  Atherosclerosis of native coronary artery of native heart without angina pectoris - encouraged him to start lipitor; continue aspirin .cont weight loss   b-met (fasting), CBC Urine dip  Scheduled with urologist for January.  Start the Lipitor. If you develop any muscular side effects from the statin, start taking coenzyme Q10 along with it.  Continue to drink plenty of water and stay well hydrated. Try heat, massage and stretches for your back. I will contact you with your kidney test results.  If they are back to normal, you can use Aleve twice daily for up to a week to help relieve this pain (only  if you stay well hydrated!!)

## 2018-03-08 ENCOUNTER — Encounter: Payer: Self-pay | Admitting: Family Medicine

## 2018-03-08 ENCOUNTER — Ambulatory Visit (INDEPENDENT_AMBULATORY_CARE_PROVIDER_SITE_OTHER): Payer: Medicare Other | Admitting: Family Medicine

## 2018-03-08 VITALS — BP 122/72 | HR 68 | Temp 97.1°F | Ht 70.0 in | Wt 224.4 lb

## 2018-03-08 DIAGNOSIS — R7989 Other specified abnormal findings of blood chemistry: Secondary | ICD-10-CM

## 2018-03-08 DIAGNOSIS — R109 Unspecified abdominal pain: Secondary | ICD-10-CM

## 2018-03-08 DIAGNOSIS — I2510 Atherosclerotic heart disease of native coronary artery without angina pectoris: Secondary | ICD-10-CM | POA: Diagnosis not present

## 2018-03-08 DIAGNOSIS — R238 Other skin changes: Secondary | ICD-10-CM

## 2018-03-08 DIAGNOSIS — R233 Spontaneous ecchymoses: Secondary | ICD-10-CM

## 2018-03-08 LAB — POCT URINALYSIS DIP (PROADVANTAGE DEVICE)
BILIRUBIN UA: NEGATIVE mg/dL
Bilirubin, UA: NEGATIVE
Blood, UA: NEGATIVE
Glucose, UA: NEGATIVE mg/dL
LEUKOCYTES UA: NEGATIVE
NITRITE UA: NEGATIVE
PROTEIN UA: NEGATIVE mg/dL
Specific Gravity, Urine: 1.015
Urobilinogen, Ur: NEGATIVE
pH, UA: 6 (ref 5.0–8.0)

## 2018-03-08 NOTE — Patient Instructions (Addendum)
  Start the Lipitor. If you develop any muscular side effects from the statin, start taking coenzyme Q10 along with it.  Continue to drink plenty of water and stay well hydrated. Try heat, massage and stretches for your back. You can also try Tylenol for your pain. I will contact you with your kidney test results.  If they are back to normal, you can use Aleve twice daily for up to a week to help relieve this pain (only if you stay well hydrated!!)

## 2018-03-09 LAB — CBC WITH DIFFERENTIAL/PLATELET
BASOS ABS: 0.1 10*3/uL (ref 0.0–0.2)
Basos: 1 %
EOS (ABSOLUTE): 0.4 10*3/uL (ref 0.0–0.4)
Eos: 6 %
HEMOGLOBIN: 14 g/dL (ref 13.0–17.7)
Hematocrit: 42.9 % (ref 37.5–51.0)
IMMATURE GRANS (ABS): 0 10*3/uL (ref 0.0–0.1)
IMMATURE GRANULOCYTES: 0 %
LYMPHS: 20 %
Lymphocytes Absolute: 1.5 10*3/uL (ref 0.7–3.1)
MCH: 27 pg (ref 26.6–33.0)
MCHC: 32.6 g/dL (ref 31.5–35.7)
MCV: 83 fL (ref 79–97)
MONOCYTES: 8 %
Monocytes Absolute: 0.6 10*3/uL (ref 0.1–0.9)
NEUTROS ABS: 4.8 10*3/uL (ref 1.4–7.0)
NEUTROS PCT: 65 %
PLATELETS: 196 10*3/uL (ref 150–450)
RBC: 5.18 x10E6/uL (ref 4.14–5.80)
RDW: 12.9 % (ref 12.3–15.4)
WBC: 7.5 10*3/uL (ref 3.4–10.8)

## 2018-03-09 LAB — BASIC METABOLIC PANEL
BUN/Creatinine Ratio: 11 (ref 10–24)
BUN: 13 mg/dL (ref 8–27)
CALCIUM: 9.1 mg/dL (ref 8.6–10.2)
CO2: 23 mmol/L (ref 20–29)
CREATININE: 1.15 mg/dL (ref 0.76–1.27)
Chloride: 103 mmol/L (ref 96–106)
GFR calc Af Amer: 75 mL/min/{1.73_m2} (ref >59)
GFR calc non Af Amer: 65 mL/min/{1.73_m2} (ref >59)
GLUCOSE: 93 mg/dL (ref 65–99)
Potassium: 4.8 mmol/L (ref 3.5–5.2)
Sodium: 141 mmol/L (ref 134–144)

## 2018-05-25 ENCOUNTER — Other Ambulatory Visit: Payer: Self-pay | Admitting: Dermatology

## 2018-05-25 DIAGNOSIS — D3611 Benign neoplasm of peripheral nerves and autonomic nervous system of face, head, and neck: Secondary | ICD-10-CM | POA: Diagnosis not present

## 2018-05-25 DIAGNOSIS — C44519 Basal cell carcinoma of skin of other part of trunk: Secondary | ICD-10-CM | POA: Diagnosis not present

## 2018-07-06 ENCOUNTER — Encounter: Payer: Self-pay | Admitting: Family Medicine

## 2018-07-08 ENCOUNTER — Encounter: Payer: Medicare Other | Admitting: Family Medicine

## 2018-07-08 DIAGNOSIS — C44519 Basal cell carcinoma of skin of other part of trunk: Secondary | ICD-10-CM | POA: Diagnosis not present

## 2018-07-13 DIAGNOSIS — R931 Abnormal findings on diagnostic imaging of heart and coronary circulation: Secondary | ICD-10-CM | POA: Insufficient documentation

## 2018-07-13 DIAGNOSIS — I2510 Atherosclerotic heart disease of native coronary artery without angina pectoris: Secondary | ICD-10-CM | POA: Insufficient documentation

## 2018-07-13 NOTE — Progress Notes (Signed)
Chief Complaint  Patient presents with  . Hypertension    fasting med check. Still not feeling well from URI call. Still has runny nose and cough. Using netti pot, which does help some. Strill taking Coricidin HB and mucinux 12hr. No fevers.   . Flu Vaccine    declined.    Patient presents for 6 month follow-up.  He recently contacted [place] with URI complaints, on 1/14, shortly after a plane trip. Symptoms had started on 1/10. Mucus had been discolored, was having lowgrade fevers, only slightly achey from laying around.  Using Coricidin, mucinex and neti-pot, which has helped some. Fevers resolved.  Has persistent cough, productive of green-brown mucus.  When he blows his nose, he gets the same discolored mucus.  He has pressure and pain in both cheeks, R>L, which is temporarily relieved by the Neti-pot.    Hypertension:He reports that his blood pressure is runningaround 125/80 (a little lower, 120/70, prior to his illness).  Denies headaches, side effects of medications (slight cough/throat clearing from ACEI, tolerable and unchanged x years, not bothersome). Denies dizziness, chest pain (only from current illness from coughing--improved with coricidin). Denies edema.  h/o hypertrophic obstructive cardiomyopathy (mild).Hehad repeatechocardiogram in 02/2016 which showed normal LV size with mild LV hypertrophy. EF 60-65%. Normal RV size and systolic function. No significant valvular abnormalities. He had developed some shortness of breath, and had CT angio with Ca score in September 2019 by his cardiologist. Extensive plaque was noted, nothing to require stent. Elevated Ca score.  Atorvastatin 75m was recommended, being aggressive with preventative medications to prevent any further buildup. At the time of his last visit with me, he hadn't yet started the statin.  He did subsequently start taking it, been on it for over 10 weeks at this point.  Admits to missing 15% of them--he thought he needed to  take it exactly at 6pm, and if he was a couple of hours late, he didn't take it.  Denies myalgias or other side effects.  Vitamin D was checked and found to be very low at 16.8 in 06/2017.  He was put on 12 weeks of prescription vitamin D. Level was normal at 35.9 in 12/2017. Since completing the prescription he has been taking 1000 IU daily.  Impaired fasting glucose--sugar was 107 on his labs in July.  He reports he lost weight, and regained it on recent vacation. Lab Results  Component Value Date   HGBA1C 5.5 01/07/2018   H/o elevated PSA. He remains under the care of urology and had benign biopsies in the past. When here last for his physical, he reported that his visits were UTD (had been there in June)--we called over, and he hasn't been seen by urologist since 10/2015. At his September visit he reported he scheduled visit for January--now reports he cancelled it due to his illness, hasn't rescheduled it yet.   Had another skin cancer removed by Dr. TDenna Haggardfrom his chest.--BCC  PMH, PVillisca SSlidellreviewed, updated  Outpatient Encounter Medications as of 07/14/2018  Medication Sig Note  . aspirin 81 MG tablet Take 81 mg by mouth daily.     .Marland Kitchenatorvastatin (LIPITOR) 40 MG tablet Take 1 tablet (40 mg total) by mouth daily at 6 PM.   . cholecalciferol (VITAMIN D) 1000 units tablet Take 1,000 Units by mouth daily.   .Marland KitchenDM-APAP-CPM (CORICIDIN HBP PO) Take 15 mLs by mouth.   . esomeprazole (NEXIUM) 40 MG capsule Take 40 mg by mouth daily before breakfast.  07/19/2015: He is actually taking 2 of the OTC Nexium (getting from [place])  . fexofenadine (ALLEGRA) 180 MG tablet Take 180 mg by mouth daily.   Marland Kitchen guaiFENesin (MUCINEX) 600 MG 12 hr tablet Take 600 mg by mouth 2 (two) times daily.   Marland Kitchen lisinopril (PRINIVIL,ZESTRIL) 40 MG tablet TAKE 1 TABLET(40 MG) BY MOUTH DAILY   . [DISCONTINUED] lisinopril (PRINIVIL,ZESTRIL) 40 MG tablet TAKE 1 TABLET(40 MG) BY MOUTH DAILY   . amoxicillin (AMOXIL) 500 MG  tablet Take 2 tablets (1,000 mg total) by mouth 2 (two) times daily.    No facility-administered encounter medications on file as of 07/14/2018.    (not taking ABX prior to today's visit)  Allergies  Allergen Reactions  . Coconut Fatty Acids Swelling   ROS:  URI symptoms as per HPI. No headaches, dizziness, chest pain, palpitations.  +cough.  No nausea, vomiting, bowel changes, bleeding, bruising.  Recent BCC removed from chest, no concerning lesions. Moods are good.  69 yo granddaughter Tanja Port makes him very happy.   PHYSICAL EXAM:  BP 128/70   Pulse 80   Temp 98.3 F (36.8 C) (Tympanic)   Ht '5\' 10"'  (1.778 m)   Wt 231 lb 6.4 oz (105 kg)   BMI 33.20 kg/m   Wt Readings from Last 3 Encounters:  07/14/18 231 lb 6.4 oz (105 kg)  03/08/18 224 lb 6.4 oz (101.8 kg)  02/01/18 224 lb (101.6 kg)    Well appearing male, in no distress HEENT: conjunctiva and sclera are clear, EOMI, TM's and EACs normal.  Nasal mucosa has mild edema, with some crusting and recent bleeding L>R. OP clear. Tender to palpation over R maxillary sinus Neck: No lymphadenopathy or mass Heart: regular rate and rhythm Lungs: clear bilaterally, no wheeze, rale, ronchi Back: no spinal or CVA tenderness.  Abdomen: Soft, nontender, no mass. Extremities: no edema Psych: normal mood, affect, hygiene and grooming Neuro: alert and oriented, cranial nerves intact, normal gait    ASSESSMENT/PLAN  Essential hypertension, benign - well controlled, continue current regimen - Plan: Comprehensive metabolic panel, lisinopril (PRINIVIL,ZESTRIL) 40 MG tablet  Vitamin D deficiency - continue daily supplement  Atherosclerosis of native coronary artery of native heart without angina pectoris - due for recheck of lipids/LFTs since being started on statin in September.  send results to cardiologist - Plan: Lipid panel  Elevated coronary artery calcium score  Medication monitoring encounter - Plan: Lipid panel, Comprehensive  metabolic panel  Acute non-recurrent maxillary sinusitis - continue neti-pot, mucinex. contact us if symptoms persist/worsen despite ABX - Plan: amoxicillin (AMOXIL) 500 MG tablet   c-met, lipids  Send labs to Dr. Harrell Gave (cards)    Please remember to reschedule your urology visit.  We are checking your cholesterol today and I will send a copy to your cardiologist.  You do not have to be exact with the timing of your statin medication.  You can take it in the evening, even at bedtime if that is easier for you to remember.  Take the antibiotic 2 pills twice daily for 10 days. Contact us if you are not any better or worse in 5-6 days.  If you are improving, but aren't completely better by the last day of pills, contact us to give some additional antibiotics.  I recommend you getting the high dose flu shot (at our office or at a pharmacy) when you are feeling better, sooner rather than later.

## 2018-07-14 ENCOUNTER — Encounter: Payer: Self-pay | Admitting: Family Medicine

## 2018-07-14 ENCOUNTER — Ambulatory Visit (INDEPENDENT_AMBULATORY_CARE_PROVIDER_SITE_OTHER): Payer: Medicare Other | Admitting: Family Medicine

## 2018-07-14 VITALS — BP 128/70 | HR 80 | Temp 98.3°F | Ht 70.0 in | Wt 231.4 lb

## 2018-07-14 DIAGNOSIS — J0100 Acute maxillary sinusitis, unspecified: Secondary | ICD-10-CM | POA: Diagnosis not present

## 2018-07-14 DIAGNOSIS — Z5181 Encounter for therapeutic drug level monitoring: Secondary | ICD-10-CM | POA: Diagnosis not present

## 2018-07-14 DIAGNOSIS — E559 Vitamin D deficiency, unspecified: Secondary | ICD-10-CM

## 2018-07-14 DIAGNOSIS — I10 Essential (primary) hypertension: Secondary | ICD-10-CM | POA: Diagnosis not present

## 2018-07-14 DIAGNOSIS — I2510 Atherosclerotic heart disease of native coronary artery without angina pectoris: Secondary | ICD-10-CM | POA: Diagnosis not present

## 2018-07-14 DIAGNOSIS — R931 Abnormal findings on diagnostic imaging of heart and coronary circulation: Secondary | ICD-10-CM

## 2018-07-14 MED ORDER — LISINOPRIL 40 MG PO TABS: ORAL_TABLET | ORAL | 1 refills | Status: DC

## 2018-07-14 MED ORDER — AMOXICILLIN 500 MG PO TABS: 1000.0000 mg | ORAL_TABLET | Freq: Two times a day (BID) | ORAL | 0 refills | Status: DC

## 2018-07-14 NOTE — Patient Instructions (Addendum)
Please remember to reschedule your urology visit.  We are checking your cholesterol today and I will send a copy to your cardiologist.  You do not have to be exact with the timing of your statin medication.  You can take it in the evening, even at bedtime if that is easier for you to remember.  Take the antibiotic 2 pills twice daily for 10 days. Contact us if you are not any better or worse in 5-6 days.  If you are improving, but aren't completely better by the last day of pills, contact us to give some additional antibiotics.  I recommend you getting the high dose flu shot (at our office or at a pharmacy) when you are feeling better, sooner rather than later.

## 2018-07-15 LAB — LIPID PANEL
Chol/HDL Ratio: 3.5 ratio (ref 0.0–5.0)
Cholesterol, Total: 130 mg/dL (ref 100–199)
HDL: 37 mg/dL — AB (ref >39)
LDL CALC: 66 mg/dL (ref 0–99)
Triglycerides: 135 mg/dL (ref 0–149)
VLDL CHOLESTEROL CAL: 27 mg/dL (ref 5–40)

## 2018-07-15 LAB — COMPREHENSIVE METABOLIC PANEL
ALBUMIN: 4.4 g/dL (ref 3.8–4.8)
ALK PHOS: 82 IU/L (ref 39–117)
ALT: 20 IU/L (ref 0–44)
AST: 22 IU/L (ref 0–40)
Albumin/Globulin Ratio: 2 (ref 1.2–2.2)
BILIRUBIN TOTAL: 0.4 mg/dL (ref 0.0–1.2)
BUN / CREAT RATIO: 10 (ref 10–24)
BUN: 11 mg/dL (ref 8–27)
CHLORIDE: 101 mmol/L (ref 96–106)
CO2: 24 mmol/L (ref 20–29)
Calcium: 9.4 mg/dL (ref 8.6–10.2)
Creatinine, Ser: 1.08 mg/dL (ref 0.76–1.27)
GFR calc Af Amer: 81 mL/min/{1.73_m2} (ref >59)
GFR calc non Af Amer: 70 mL/min/{1.73_m2} (ref >59)
Globulin, Total: 2.2 g/dL (ref 1.5–4.5)
Glucose: 92 mg/dL (ref 65–99)
Potassium: 5 mmol/L (ref 3.5–5.2)
SODIUM: 141 mmol/L (ref 134–144)
Total Protein: 6.6 g/dL (ref 6.0–8.5)

## 2018-07-15 NOTE — Progress Notes (Signed)
Thank you! LDL looks much better than last year. I appreciate it.

## 2018-08-02 DIAGNOSIS — U071 COVID-19: Secondary | ICD-10-CM

## 2018-08-02 HISTORY — DX: COVID-19: U07.1

## 2018-08-19 DIAGNOSIS — H2513 Age-related nuclear cataract, bilateral: Secondary | ICD-10-CM | POA: Diagnosis not present

## 2018-09-16 ENCOUNTER — Encounter: Payer: Self-pay | Admitting: Family Medicine

## 2019-01-06 ENCOUNTER — Other Ambulatory Visit: Payer: Self-pay

## 2019-01-06 DIAGNOSIS — C44612 Basal cell carcinoma of skin of right upper limb, including shoulder: Secondary | ICD-10-CM | POA: Diagnosis not present

## 2019-01-06 DIAGNOSIS — C44519 Basal cell carcinoma of skin of other part of trunk: Secondary | ICD-10-CM | POA: Diagnosis not present

## 2019-01-06 DIAGNOSIS — L859 Epidermal thickening, unspecified: Secondary | ICD-10-CM | POA: Diagnosis not present

## 2019-01-06 DIAGNOSIS — D225 Melanocytic nevi of trunk: Secondary | ICD-10-CM | POA: Diagnosis not present

## 2019-01-06 DIAGNOSIS — L820 Inflamed seborrheic keratosis: Secondary | ICD-10-CM | POA: Diagnosis not present

## 2019-01-06 DIAGNOSIS — D485 Neoplasm of uncertain behavior of skin: Secondary | ICD-10-CM | POA: Diagnosis not present

## 2019-01-21 ENCOUNTER — Other Ambulatory Visit: Payer: Self-pay | Admitting: Cardiology

## 2019-01-21 ENCOUNTER — Other Ambulatory Visit: Payer: Self-pay | Admitting: Family Medicine

## 2019-01-21 DIAGNOSIS — I10 Essential (primary) hypertension: Secondary | ICD-10-CM

## 2019-02-24 DIAGNOSIS — C44712 Basal cell carcinoma of skin of right lower limb, including hip: Secondary | ICD-10-CM | POA: Diagnosis not present

## 2019-04-11 ENCOUNTER — Ambulatory Visit (INDEPENDENT_AMBULATORY_CARE_PROVIDER_SITE_OTHER): Payer: Medicare Other | Admitting: Cardiology

## 2019-04-11 ENCOUNTER — Other Ambulatory Visit: Payer: Self-pay

## 2019-04-11 ENCOUNTER — Encounter: Payer: Self-pay | Admitting: Cardiology

## 2019-04-11 VITALS — BP 144/82 | HR 67 | Temp 97.0°F | Ht 70.5 in | Wt 236.2 lb

## 2019-04-11 DIAGNOSIS — I2510 Atherosclerotic heart disease of native coronary artery without angina pectoris: Secondary | ICD-10-CM

## 2019-04-11 DIAGNOSIS — Z7189 Other specified counseling: Secondary | ICD-10-CM

## 2019-04-11 DIAGNOSIS — I10 Essential (primary) hypertension: Secondary | ICD-10-CM | POA: Diagnosis not present

## 2019-04-11 DIAGNOSIS — R002 Palpitations: Secondary | ICD-10-CM | POA: Diagnosis not present

## 2019-04-11 DIAGNOSIS — E7800 Pure hypercholesterolemia, unspecified: Secondary | ICD-10-CM | POA: Diagnosis not present

## 2019-04-11 DIAGNOSIS — I421 Obstructive hypertrophic cardiomyopathy: Secondary | ICD-10-CM | POA: Diagnosis not present

## 2019-04-11 NOTE — Patient Instructions (Signed)
Medication Instructions:  Your Physician recommend you continue on your current medication as directed.    *If you need a refill on your cardiac medications before your next appointment, please call your pharmacy*  Lab Work: None  Testing/Procedures: None  Follow-Up: At [facility], you and your health needs are our priority.  As part of our continuing mission to provide you with exceptional heart care, we have created designated Provider Care Teams.  These Care Teams include your primary Cardiologist (physician) and Advanced Practice Providers (APPs -  Physician Assistants and Nurse Practitioners) who all work together to provide you with the care you need, when you need it.  Your next appointment:   12 months  The format for your next appointment:   In Person  Provider:   You may see Buford Dresser, MD or one of the following Advanced Practice Providers on your designated Care Team:    Rosaria Ferries, PA-C  Jory Sims, DNP, ANP  Cadence Kathlen Mody, NP

## 2019-04-11 NOTE — Progress Notes (Signed)
Cardiology Office Note:    Date:  04/11/2019   ID:  Erik Leon, DOB [birth year], MRN [id number]  PCP:  Rita Ohara, MD  Cardiologist:  Buford Dresser, MD PhD  Referring MD: Rita Ohara, MD   CC: follow up, palpitations  History of Present Illness:    Erik Leon is a 68 y.o. male with a hx of hypertension and mild HOCM who is seen for follow up. He was initially seen as a new consult at the request of Dr. Tomi Bamberger for evaluation of chest pain 02/01/18.  Cardiac history: Chest pain 2019: intermittent. No aggravating factors. Last 10 seconds. If he exerts himself, feels that his pulse goes faster, but doesn't get discomfort. Started several years ago. Happens once every couple months. Last occurrence two weeks ago, occurred while sitting at his desk. Feels like when "you swallow something and it goes down the wrong pipe." Feels like it is under his heart. No radiation, no associated symptoms. He reports multiple prior stress tests, though I do not have the records. Noted as stress in Epic in 2013, but only normal echo results attached.  FH: no known CAD. Brother with congenital cardiac defect (below), may have passed from SCD.  HOCM: echo in 2013 pdf reports SAM, though I cannot see additional details. Noted as mild LVH, I do not have measurements or gradients. Echo here in 2017 noted septal thickness as 1.5 cm and posterior wall thickness as 1.37 cm. No gradient noted.   Today: Has been very busy with work, works with large residential [employer]. Has traveled, spent time in [place]. Spoke at length about Covid, risk, what is happening in the the population.  Counseled on continued vigilance for exposure avoidance.   Has occasional flutter in his chest, happening about 1x/month. Lasts only seconds. Gets mildly lightheaded with it, no syncope. Not related to exertion. No chest pain, breathing is good.   Has been doing well with atorvastatin, but recently has had  difficulty remembering to take it. Taking atorvastatin about once every 10 days. Discussed that this can be taken any time of day if it is easier to remember.  BP at home 110/72. Has had as low as 98/70, but was asymptomatic. Was swimming 2-3 times/day in [place] at that time. Has not had any high numbers. Had cuff checked at [facility].  Denies shortness of breath at rest or with normal exertion. No PND, orthopnea, LE edema or unexpected weight gain. No syncope.  Past Medical History:  Diagnosis Date  . Allergy   . BCC (basal cell carcinoma of skin) 11/2017   upper back/right shoulder; 05/2018 R chest  . Calcaneal fracture 2014   right; s/p boot; reinjured in 2019  . Colon polyp   . Diverticulosis 6/09   rare L colon diverticuli  . Elevated PSA 10/2012   benign prostate biopsies 11/2012  . Esophageal stricture    s/p dilatation (Dr. Earlean Shawl)  . GERD (gastroesophageal reflux disease)   . Heart murmur   . Hypertension   . Hypertr obst cardiomyop    mild--Dr. Radford Pax (previously)  . Internal hemorrhoid 6/09  . Migraine age 41   resolved after nasal surgery    Past Surgical History:  Procedure Laterality Date  . CHOLECYSTECTOMY  5/03  . COLONOSCOPY  11/2007, 2011, 2014   Dr. Rudi Rummage again 2019  . ELBOW SURGERY Right 06/2013   bone spur; Dr. Percell Miller  . ESOPHAGOGASTRODUODENOSCOPY  2012   with dilatation.  Dr. Earlean Shawl  . LIPOMA  EXCISION     Dr. Stanford Breed from L paramedian prevertebral space  . NASAL POLYP SURGERY  2008  . SHOULDER ARTHROSCOPY  12/03; 1/06   right; left    Current Medications: Current Outpatient Medications on File Prior to Visit  Medication Sig  . aspirin 81 MG tablet Take 81 mg by mouth daily.    Marland Kitchen atorvastatin (LIPITOR) 40 MG tablet TAKE 1 TABLET(40 MG TOTAL) BY MOUTH DAILY AT 6 PM  . cholecalciferol (VITAMIN D) 1000 units tablet Take 1,000 Units by mouth daily.  Marland Kitchen esomeprazole (NEXIUM) 40 MG capsule Take 40 mg by mouth daily before breakfast.    .  fexofenadine (ALLEGRA) 180 MG tablet Take 180 mg by mouth daily.  Marland Kitchen lisinopril (ZESTRIL) 40 MG tablet TAKE 1 TABLET(40 MG) BY MOUTH DAILY   No current facility-administered medications on file prior to visit.      Allergies:   Coconut fatty acids   Social History   Socioeconomic History  . Marital status: Divorced    Spouse name: Not on file  . Number of children: Not on file  . Years of education: Not on file  . Highest education level: Not on file  Occupational History  . 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 Financial resource strain: Not on file  . Food insecurity    Worry: Not on file    Inability: Not on file  . Transportation needs    Medical: Not on file    Non-medical: Not on file  Tobacco Use  . Smoking status: Former Smoker    Quit date: 06/24/1983    Years since quitting: 35.8  . Smokeless tobacco: Never Used  Substance and Sexual Activity  . Alcohol use: Yes    Comment: 1 drink/month (scotch)  . Drug use: No  . Sexual activity: Yes    Partners: Female  Lifestyle  . Physical activity    Days per week: Not on file    Minutes per session: Not on file  . Stress: Not on file  Relationships  . Social Herbalist on phone: Not on file    Gets together: Not on file    Attends religious service: Not on file    Active member of club or organization: Not on file    Attends meetings of clubs or organizations: Not on file    Relationship status: Not on file  Other Topics Concern  . Not on file  Social History Narrative   Divorced. 2 biologic children, a son and a daughter.  Adopted 3 children. 1 granddaughter Tanja Port, [place], born 06/2014)   Lives alone, no pets.     Family History: The patient's family history includes Cancer in his mother; Dementia in his mother; Hypertension in his mother. There is no history of Diabetes, Prostate cancer, or Colon cancer. Half-brother had a birth defect and died after  playing handball at age 66. Not HCM as far as he knows. There were 11 sons, no other with heart problems. Not sure about his dad's history, he was an alcoholic. No MI or stroke.  ROS:   Please see the history of present illness.  Additional pertinent ROS: Constitutional: Negative for chills, fever, night sweats, unintentional weight loss  HENT: Negative for ear pain and hearing loss.   Eyes: Negative for loss of vision and eye pain.  Respiratory: Negative for cough, sputum, wheezing.   Cardiovascular: See HPI. Gastrointestinal: Negative for  abdominal pain, melena, and hematochezia.  Genitourinary: Negative for dysuria and hematuria.  Musculoskeletal: Negative for falls and myalgias.  Skin: Negative for itching and rash.  Neurological: Negative for focal weakness, focal sensory changes and loss of consciousness.  Endo/Heme/Allergies: Does not bruise/bleed easily.   EKGs/Labs/Other Studies Reviewed:    The following studies were reviewed today: Echo 2013: (pdf scan)--noted as stress, but only resting echo in note EF >70%, mild cLVH. +SAM, trace MR  Echo 2017:  Study Conclusions - Left ventricle: The cavity size was normal. Wall thickness was   increased in a pattern of mild LVH. Systolic function was normal.   The estimated ejection fraction was in the range of 60% to 65%.   Wall motion was normal; there were no regional wall motion   abnormalities. Doppler parameters are consistent with abnormal   left ventricular relaxation (grade 1 diastolic dysfunction). - Aortic valve: There was no stenosis. - Mitral valve: There was trivial regurgitation. - Right ventricle: The cavity size was normal. Systolic function   was normal. - Pulmonary arteries: No complete TR doppler jet so unable to   estimate PA systolic pressure. - Inferior vena cava: The vessel was normal in size. The   respirophasic diameter changes were in the normal range (>= 50%),   consistent with normal central venous  pressure.  Impressions:  - Normal LV size with mild LV hypertrophy. EF 60-65%. Normal RV   size and systolic function. No significant valvular   abnormalities.  CT cardiac 03/03/2018 FINDINGS: The pulmonary veins drained normally to the left atrium. Calcium Score: 198 Agatston units Coronary Arteries: Right dominant with no anomalies LM: Short, no plaque or stenosis. LAD system: Relatively small caliber vessel. Mixed plaque proximal and mid LAD, appears to be mild stenosis. Circumflex system: Large LCx. Mixed plaque mid vessel without significant stenosis. RCA system: Calcified plaque distal RCA/PLV with mild stenosis.  IMPRESSION: 1. Coronary artery calcium score 198 Agatston units. This places the patient in the 65th percentile for age and gender, suggesting intermediate risk for future cardiac events. 2. Extensive plaque in the proximal and mid LAD. Appears to be mild stenosis. 3.  Mild stenosis in distal RCA/PLV.  FFR without hemodynamically significant stenosis  EKG:  ECG personally reviewed, one performed today shows NSR with nonspecific ST changes, similar to 2017  Recent Labs: 07/14/2018: ALT 20; BUN 11; Creatinine, Ser 1.08; Potassium 5.0; Sodium 141  Recent Lipid Panel    Component Value Date/Time   CHOL 130 07/14/2018 1225   TRIG 135 07/14/2018 1225   HDL 37 (L) 07/14/2018 1225   CHOLHDL 3.5 07/14/2018 1225   CHOLHDL 4.0 02/21/2016 1035   VLDL 22 02/21/2016 1035   LDLCALC 66 07/14/2018 1225    Physical Exam:    VS:  BP (!) 144/82   Pulse 67   Temp (!) 97 F (36.1 C)   Ht 5' 10.5" (1.791 m)   Wt 236 lb 3.2 oz (107.1 kg)   SpO2 97%   BMI 33.41 kg/m     Wt Readings from Last 3 Encounters:  04/11/19 236 lb 3.2 oz (107.1 kg)  07/14/18 231 lb 6.4 oz (105 kg)  03/08/18 224 lb 6.4 oz (101.8 kg)    GEN: Well nourished, well developed in no acute distress HEENT: Normal, moist mucous membranes NECK: No JVD CARDIAC: regular rhythm, normal S1 and S2,  no rubs or gallops. 1/6 SEM at sternal border, did not increase with valsalva today. VASCULAR: Radial and DP pulses 2+  bilaterally. No carotid bruits RESPIRATORY:  Clear to auscultation without rales, wheezing or rhonchi  ABDOMEN: Soft, non-tender, non-distended MUSCULOSKELETAL:  Ambulates independently SKIN: Warm and dry, no edema NEUROLOGIC:  Alert and oriented x 3. No focal neuro deficits noted. PSYCHIATRIC:  Normal affect   ASSESSMENT:    1. Mild HOCM (hypertrophic obstructive cardiomyopathy) ([place])   2. Heart palpitations   3. Essential hypertension   4. Pure hypercholesterolemia   5. Nonocclusive coronary atherosclerosis of native coronary artery   6. Counseling on health promotion and disease prevention   7. Educated about COVID-19 virus infection    PLAN:    Chest fluttering/palpitation: -coronary CT 02/2018 with Ca score 198, prox/mid LAD plaque but no significant stenosis by FFR -very rare and brief, no red flag symptoms.  -if becomes more frequent, will order monitor for further evaluation  HOCM, mild -very mild murmur -Predominantly focal septal hypertrophy. Max LVOT gradient noted on imaging was 12 mmHg. -asymptomatic, no syncope, no SCD in family related to HCM (though more information on half-brother's condition would be helpful). -had echo in 2017, will repeat in the future based on symptoms vs. Routine monitoring  Hypertension: elevated today, even on recheck, but excellent control at home -continue lisinopril -goal <130/80, call if home numbers rise  Hypercholesterolemia: LDL goal <70. However, has not been taking statin routinely recently -counseled on daily atorvastatin, ok to take any time of day  Nonobstructive CAD: based on plaque/calcium on scan -asymptomatic -continue aspirin, atorvastatin  Secondary prevention -recommend heart healthy/Mediterranean diet, with whole grains, fruits, vegetable, fish, lean meats, nuts, and olive oil. Limit salt.  -recommend moderate walking, 3-5 times/week for 30-50 minutes each session. Aim for at least 150 minutes.week. Goal should be pace of 3 miles/hours, or walking 1.5 miles in 30 minutes -recommend avoidance of tobacco products. Avoid excess alcohol.  Covid education: discussed importance of mask/social distancing at length  Plan for follow up: 1 year or sooner PRN  Medication Adjustments/Labs and Tests Ordered: Current medicines are reviewed at length with the patient today.  Concerns regarding medicines are outlined above.  Orders Placed This Encounter  Procedures  . EKG 12-Lead   No orders of the defined types were placed in this encounter.   Patient Instructions  Medication Instructions:  Your Physician recommend you continue on your current medication as directed.    *If you need a refill on your cardiac medications before your next appointment, please call your pharmacy*  Lab Work: None  Testing/Procedures: None  Follow-Up: At [facility], you and your health needs are our priority.  As part of our continuing mission to provide you with exceptional heart care, we have created designated Provider Care Teams.  These Care Teams include your primary Cardiologist (physician) and Advanced Practice Providers (APPs -  Physician Assistants and Nurse Practitioners) who all work together to provide you with the care you need, when you need it.  Your next appointment:   12 months  The format for your next appointment:   In Person  Provider:   You may see Buford Dresser, MD or one of the following Advanced Practice Providers on your designated Care Team:    Rosaria Ferries, PA-C  Jory Sims, DNP, ANP  Cadence Kathlen Mody, NP      Signed, Buford Dresser, MD PhD 04/11/2019 12:35 PM    [place]

## 2019-04-21 ENCOUNTER — Telehealth: Payer: Self-pay

## 2019-04-21 NOTE — Telephone Encounter (Signed)
Spoke with patient and he can wait until his CPE next week for this refill. Pharmacy notified.

## 2019-04-21 NOTE — Telephone Encounter (Signed)
Received fax from [facility]. For Lisinopril 40 mg 1 daily, last apt was 07/14/18.

## 2019-04-26 NOTE — Progress Notes (Deleted)
Erik Leon is a 68 y.o. male who presents for annual wellness visit and follow-up on chronic medical conditions.   He has the following concerns:  Hypertension:He reports that his blood pressure is running Had some lower BP's when swimming frequently when in [place], denies any high BP's. Denies headaches, side effects of medications (slight cough/throat clearing from ACEI, tolerable and unchanged x years, not bothersome). Denies dizziness, chest pain, edema. He reported to his cardiologist recently that he has occasional short-lived flutter in his chest (1x/month, lasting seconds, mildly lightheaded briefly).    h/o hypertrophic obstructive cardiomyopathy (mild).Hehad repeatechocardiogram in 02/2016 which showed normal LV size with mild LV hypertrophy. EF 60-65%. Normal RV size and systolic function. No significant valvular abnormalities. He had developed some shortness of breath, and had CT angio with Ca score in September 2019 by his cardiologist. Extensive plaque was noted, nothing to require stent. Elevated Ca score.  Atorvastatin 9m was recommended, being aggressive with preventative medications to prevent any further buildup.  Denies myalgias or other side effects. He reported to cardiologist recently that he had been forgetting to take it, remembering only once every 10 days.  Lab Results  Component Value Date   CHOL 130 07/14/2018   HDL 37 (L) 07/14/2018   LDLCALC 66 07/14/2018   TRIG 135 07/14/2018   CHOLHDL 3.5 07/14/2018   Vitamin D was checked and found to be very low at 16.8 in 06/2017. He was put on 12 weeks of prescription vitamin D. Level was normal at 35.9 in 12/2017. Since completing the prescription he has been taking 1000 IU daily.  Impaired fasting glucose--sugar was 107 on his labs in July, 2019.  Sugar was 92 in 06/2018. Lab Results  Component Value Date   HGBA1C 5.5 01/07/2018    H/o elevated PSA. He previously saw urologist, had benign biopsies in the  past. Last year we called over there and he hadn't been seen by urologist since 10/2015.  GERD: Takes Nexium daily. He now takes 2 of the OTC Nexium, once daily.No recurrent dysphagia, as long as food iscut small. Sometimes will have problems eating a sub (thick bread), so no longer eats the bread and meat together.H/o dilatation by Dr. MEarlean Shawl Plans to see Dr. MEarlean Shawlbefore he reitres for another dilatation\  He had colonoscopy 07/02/17. 5 polyps were found and it was recommended he have colonoscopy repeated in 3 years (06/2020). Per [facility], 4/5 of the polyps were adenomatous.    Immunization History  Administered Date(s) Administered  . Influenza, High Dose Seasonal PF 07/13/2017  . Pneumococcal Conjugate-13 02/21/2016  . Pneumococcal Polysaccharide-23 07/13/2017  . Tdap 10/16/2010  . Zoster 10/29/2011   Last colonoscopy: 06/2017, adenomatous colon polyps and internal hemorrhoids.  F/u rec 3 years Last PSA: ?? Dentist: twice yearly  Ophtho:yearly  Exercise:Walks a lot on his job, getting 10-11K steps daily (walks jobsites).   Has a treadmill Plays tennis Heavy lifting (>30#) at work, equipment.  Other doctors caring for patient: Ophtho: Dr. DBing PlumeDentist: Dr. MPablo LawrenceGI: Dr. MEarlean ShawlDerm: Dr. TDenna HaggardPodiatry: Dr. TAmalia HaileyCardiology: Dr. CHarrell GaveENT: Dr. TLytle Michaels [facility]: Dr. HLouis Meckel Depression Screen: negative Fall Screen: negative Functional status screen:  Mini-Cog:   See epic for full screens  He has a living will and healthcare power of attorney. (last year was in the process of changing)   ROS: The patient denies anorexia, fever, headaches, vision loss, decreased hearing, ear pain, hoarseness, palpitations, dizziness, syncope, dyspnea on exertion, swelling,  nausea, vomiting, diarrhea, constipation, abdominal pain, melena, hematochezia, hematuria, incontinence, erectile dysfunction, nocturia, weakened urine stream, dysuria,  genital lesions, numbness, tingling, weakness, tremor, suspicious skin lesions, depression, anxiety, abnormal bleeding/bruising, or enlarged lymph nodes.  Sees derm (Dr. Denna Haggard) regularly, no skin concerns. Only sleeps 4-5 hrs/night (long-term).    PHYSICAL EXAM:  Wt Readings from Last 3 Encounters:  04/11/19 236 lb 3.2 oz (107.1 kg)  07/14/18 231 lb 6.4 oz (105 kg)  03/08/18 224 lb 6.4 oz (101.8 kg)   General Appearance:  Alert, cooperative, no distress, appears stated age   Head:  Normocephalic, without obvious abnormality, atraumatic   Eyes:  PERRL, conjunctiva/corneas clear, EOM's intact, fundi benign   Ears:  Normal TM's and external ear canals   Nose:  Not examined, wearing mask due to COVID-19 pandemic  Throat:  Not examined, wearing mask due to COVID-19 pandemic  Neck:  Supple, no lymphadenopathy; thyroid: no enlargement/ tenderness/nodules; no carotid bruit or JVD.  Back:  Spine nontender, no curvature, ROM normal, no CVA tenderness   Lungs:  Clear to auscultation bilaterally without wheezes, rales or ronchi; respirations unlabored   Chest Wall:  No tenderness or deformity   Heart:  Regular rate and rhythm, S1 and S2 normal, no rub or gallop. 3/6 SEM at L>R USB, and at apex  Breast Exam:  No chest wall tenderness, masses or gynecomastia   Abdomen:  Soft, non-tender, nondistended, normoactive bowel sounds, no masses, no hepatosplenomegaly   Genitalia:  Deferred to urologist.   Rectal:  Deferred to urologist.   Extremities:  No clubbing, cyanosis or edema   Pulses:  2+ and symmetric all extremities   Skin:  Skin color, texture, turgor normal, no rashes or lesions noted  Lymph nodes:  Cervical, supraclavicular, and axillary nodes normal   Neurologic:  CNII-XII intact, normal strength, sensation and gait; reflexes 2+ and symmetric throughout   Psych: Normal mood, affect, hygiene and grooming  ADD GU/RECTAL IF NOT SEEING UROLOGY   ASSESSMENT/PLAN:  Has he  seen [facility]???? Need to know if we check PSA and his exams, or if has/had appt  High dose flu shot Shingrix rec from pharm  c-met, CBC, D PSA if not seeing urologist (and report counseling done) No lipids--at goal in January, and recently noncompliant   Recommended at least 30 minutes of aerobic activity at least 5 days/week, weight bearing exercise at least 2x/week;proper sunscreen use reviewed; healthy diet and alcohol recommendations (less than or equal to 2 drinks/day) reviewed; regular seatbelt use; changing batteries in smoke detectors. Immunization recommendations discussed. Continue yearly high dose flu shots.  Shingrix recommended, to get from pharmacy. Colonoscopy recommendations reviewed--UTD, due again 06/2020   MOST form filled out. Full Code, Full Care  F/u 6 mos   Medicare Attestation I have personally reviewed: The patient's medical and social history Their use of alcohol, tobacco or illicit drugs Their current medications and supplements The patient's functional ability including ADLs,fall risks, home safety risks, cognitive, and hearing and visual impairment Diet and physical activities Evidence for depression or mood disorders  The patient's weight, height, BMI have been recorded in the chart.  I have made referrals, counseling, and provided education to the patient based on review of the above and I have provided the patient with a written personalized care plan for preventive services.

## 2019-04-27 ENCOUNTER — Telehealth: Payer: Self-pay | Admitting: Family Medicine

## 2019-04-27 DIAGNOSIS — R972 Elevated prostate specific antigen [PSA]: Secondary | ICD-10-CM

## 2019-04-27 DIAGNOSIS — Z5181 Encounter for therapeutic drug level monitoring: Secondary | ICD-10-CM

## 2019-04-27 DIAGNOSIS — Z125 Encounter for screening for malignant neoplasm of prostate: Secondary | ICD-10-CM

## 2019-04-27 DIAGNOSIS — I10 Essential (primary) hypertension: Secondary | ICD-10-CM

## 2019-04-27 DIAGNOSIS — E559 Vitamin D deficiency, unspecified: Secondary | ICD-10-CM

## 2019-04-27 DIAGNOSIS — R7301 Impaired fasting glucose: Secondary | ICD-10-CM

## 2019-04-27 DIAGNOSIS — E7800 Pure hypercholesterolemia, unspecified: Secondary | ICD-10-CM

## 2019-04-27 NOTE — Telephone Encounter (Signed)
12/3, 7, or 17th as second PE's of afternoon are options

## 2019-04-27 NOTE — Telephone Encounter (Signed)
Spoke with patient.  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  Employee just received results 40 minutes ago that he was COVID positive.  Erik Leon is not having any sx but will go get tested on Monday.  He will let us know if he becomes symptomatic and he will quaranteen.    Is there a day you want to put him back in on your schedule?

## 2019-04-28 ENCOUNTER — Ambulatory Visit: Payer: Self-pay | Admitting: Family Medicine

## 2019-04-28 NOTE — Telephone Encounter (Signed)
Patient scheduled CPE for 06/09/2019 @ 2:45pm and fasting labs for 06/07/2019 @ 8:30am-need orders please and thanks.

## 2019-04-28 NOTE — Telephone Encounter (Signed)
Orders entered.  Pt past due for urology visit, so PSA also ordered

## 2019-06-07 ENCOUNTER — Other Ambulatory Visit: Payer: Medicare Other

## 2019-06-07 ENCOUNTER — Other Ambulatory Visit: Payer: Self-pay

## 2019-06-07 DIAGNOSIS — Z5181 Encounter for therapeutic drug level monitoring: Secondary | ICD-10-CM

## 2019-06-07 DIAGNOSIS — Z125 Encounter for screening for malignant neoplasm of prostate: Secondary | ICD-10-CM

## 2019-06-07 DIAGNOSIS — R972 Elevated prostate specific antigen [PSA]: Secondary | ICD-10-CM

## 2019-06-07 DIAGNOSIS — R7301 Impaired fasting glucose: Secondary | ICD-10-CM

## 2019-06-07 DIAGNOSIS — E7800 Pure hypercholesterolemia, unspecified: Secondary | ICD-10-CM

## 2019-06-07 DIAGNOSIS — E559 Vitamin D deficiency, unspecified: Secondary | ICD-10-CM

## 2019-06-07 DIAGNOSIS — I10 Essential (primary) hypertension: Secondary | ICD-10-CM

## 2019-06-08 LAB — COMPREHENSIVE METABOLIC PANEL
ALT: 15 IU/L (ref 0–44)
AST: 16 IU/L (ref 0–40)
Albumin/Globulin Ratio: 2.3 — ABNORMAL HIGH (ref 1.2–2.2)
Albumin: 4.2 g/dL (ref 3.8–4.8)
Alkaline Phosphatase: 88 IU/L (ref 39–117)
BUN/Creatinine Ratio: 13 (ref 10–24)
BUN: 14 mg/dL (ref 8–27)
Bilirubin Total: 0.7 mg/dL (ref 0.0–1.2)
CO2: 24 mmol/L (ref 20–29)
Calcium: 9.2 mg/dL (ref 8.6–10.2)
Chloride: 103 mmol/L (ref 96–106)
Creatinine, Ser: 1.11 mg/dL (ref 0.76–1.27)
GFR calc Af Amer: 78 mL/min/{1.73_m2} (ref >59)
GFR calc non Af Amer: 67 mL/min/{1.73_m2} (ref >59)
Globulin, Total: 1.8 g/dL (ref 1.5–4.5)
Glucose: 113 mg/dL — ABNORMAL HIGH (ref 65–99)
Potassium: 4.9 mmol/L (ref 3.5–5.2)
Sodium: 142 mmol/L (ref 134–144)
Total Protein: 6 g/dL (ref 6.0–8.5)

## 2019-06-08 LAB — CBC WITH DIFFERENTIAL/PLATELET
Basophils Absolute: 0.1 10*3/uL (ref 0.0–0.2)
Basos: 1 %
EOS (ABSOLUTE): 0.4 10*3/uL (ref 0.0–0.4)
Eos: 5 %
Hematocrit: 41.4 % (ref 37.5–51.0)
Hemoglobin: 13.8 g/dL (ref 13.0–17.7)
Immature Grans (Abs): 0 10*3/uL (ref 0.0–0.1)
Immature Granulocytes: 0 %
Lymphocytes Absolute: 1.9 10*3/uL (ref 0.7–3.1)
Lymphs: 25 %
MCH: 27.8 pg (ref 26.6–33.0)
MCHC: 33.3 g/dL (ref 31.5–35.7)
MCV: 83 fL (ref 79–97)
Monocytes Absolute: 0.8 10*3/uL (ref 0.1–0.9)
Monocytes: 10 %
Neutrophils Absolute: 4.5 10*3/uL (ref 1.4–7.0)
Neutrophils: 59 %
Platelets: 176 10*3/uL (ref 150–450)
RBC: 4.97 x10E6/uL (ref 4.14–5.80)
RDW: 13 % (ref 11.6–15.4)
WBC: 7.6 10*3/uL (ref 3.4–10.8)

## 2019-06-08 LAB — LIPID PANEL
Chol/HDL Ratio: 3.3 ratio (ref 0.0–5.0)
Cholesterol, Total: 114 mg/dL (ref 100–199)
HDL: 35 mg/dL — ABNORMAL LOW (ref >39)
LDL Chol Calc (NIH): 59 mg/dL (ref 0–99)
Triglycerides: 107 mg/dL (ref 0–149)
VLDL Cholesterol Cal: 20 mg/dL (ref 5–40)

## 2019-06-08 LAB — HEMOGLOBIN A1C
Est. average glucose Bld gHb Est-mCnc: 123 mg/dL
Hgb A1c MFr Bld: 5.9 % — ABNORMAL HIGH (ref 4.8–5.6)

## 2019-06-08 LAB — VITAMIN D 25 HYDROXY (VIT D DEFICIENCY, FRACTURES): Vit D, 25-Hydroxy: 37.5 ng/mL (ref 30.0–100.0)

## 2019-06-08 LAB — PSA: Prostate Specific Ag, Serum: 4.1 ng/mL — ABNORMAL HIGH (ref 0.0–4.0)

## 2019-06-08 NOTE — Progress Notes (Signed)
Chief Complaint  Patient presents with  . Medicare Wellness    nonfasting AWV. No new concerns. He is planning on seeing urologist so he does not want to do prostate/rectal exam here.     Erik Leon is a 68 y.o. male who presents for annual wellness visit and follow-up on chronic medical conditions.  He had labs done prior to his visit. He has the following concerns:  He complains of terrible leg cramps, 4/7 nights of the week--in feet, legs, inner thighs, more on the left than the right LE. He reports he drinks a lot of water--drinks 24 pack of water every 3 days, but also reports he drinks 10-15 cups of coffee/day. Slept the best when in [place] (and drank only 1 cup/d, not working)  Hypertension:He is compliant in taking lisinopril 40mg  daily.  He reports that his blood pressure is running125/75 this morning, usually in this range. Denies headaches, side effects of medications (slight cough/throat clearing from ACEI, tolerable and unchanged x years, not bothersome). Deniesdizziness, chest pain, edema. He last saw cardiologist in October.  He reported to him that he has occasional short-lived flutter in his chest (1x/month, lasting seconds, mildly lightheaded briefly).  He admits this could be related to caffeine intake.  h/o hypertrophic obstructive cardiomyopathy (mild).Hehad repeatechocardiogram in 02/2016 which showed normal LV size with mild LV hypertrophy. EF 60-65%. Normal RV size and systolic function. No significant valvular abnormalities. He haddeveloped some shortness of breath, and had CT angio with Ca score in September 2019 by his cardiologist. Extensive plaque was noted, nothing to require stent. Elevated Ca score. Atorvastatin 40mg  was recommended, being aggressive with preventativemedications to prevent any further buildup.   He reports compliance with statin, and denies myalgias or other side effects.  He had mentioned to his cardiologist that he would forget med  frequently, but no problems since he changed from trying to take at 6pm (as stated on directions), to taking it with his other meds.   Recent Labs       Lab Results  Component Value Date   CHOL 130 07/14/2018   HDL 37 (L) 07/14/2018   LDLCALC 66 07/14/2018   TRIG 135 07/14/2018   CHOLHDL 3.5 07/14/2018     Vitamin D was checked and found to be very low at 16.8 in 06/2017. He was put on 12 weeks of prescription vitamin D.Level was normal at 35.9 in 12/2017. Since completing the prescription he has been taking1000 IU daily.  Impaired fasting glucose--sugar was 107on his labs in July, 2019.Sugar was 92 in 06/2018. Recent Labs       Lab Results  Component Value Date   HGBA1C 5.5 01/07/2018     States he lost 15# when he was swimming in [place], gained it back upon returning.  He states he doesn't eat much, but eats the wrong things.  He eats a lot of sweets, a lot of cheese, breads.  Only alcohol 2 drinks/month.   H/o elevated PSA. He previously saw urologist, had benign biopsies in the past. After his visit here last year we called over to get notes, and he hadn't been seen by urologist since 10/2015. He plans to go back to urologist for his exam (though had his PSA done here).  GERD: He is taking 2 of the OTC Nexium BID. Used to take it once daily, but had recurrence of GERD. No recurrent dysphagia, as long as food iscut small. Sometimes will have problems eating a sub (thick bread), so  no longer eatsthe bread and meattogether.H/o dilatation by Dr. Earlean Shawl. Plans to see Dr. Earlean Shawl before he retires for another dilatation, was told to repeat EGD in 5 years (not yet due.)  He had colonoscopy 07/02/17. 5 polyps were found and it was recommended he have colonoscopy repeated in 3 years (06/2020).Per [facility], 4/5 of the polyps were adenomatous.  Immunization History  Administered Date(s) Administered  . Influenza, High Dose Seasonal PF 07/13/2017  . Pneumococcal  Conjugate-13 02/21/2016  . Pneumococcal Polysaccharide-23 07/13/2017  . Tdap 10/16/2010  . Zoster 10/29/2011   Last colonoscopy:06/2017, adenomatous colon polyps and internal hemorrhoids. F/u rec 3 years Last PSA:  Lab Results  Component Value Date   PSA1 4.1 (H) 06/07/2019   PSA 6.59 (H) 07/19/2015   PSA 4.29 (H) 11/01/2012   PSA 4.63 (H) 11/01/2012   Dentist: twice yearly Ophtho:yearly  Exercise:walks less on the job (1/3 of previous amount). Swims regularly only when in [place].  Plans to hire trainer, considering indoor pool. Heavy lifting (>30#) at work, equipment.  Other doctors caring for patient: Ophtho: Mauri Reading Dentist: Dr. Pablo Lawrence GI: Dr. Earlean Shawl Derm: Dr. Denna Haggard Podiatry: Dr. Amalia Hailey Cardiology: Dr. Harrell Gave ENT: Dr. Lytle Michaels: [facility] Urology: Dr. Louis Meckel  DepressionScreen: negative Fall Screen: negative Functional status screen:remarkable for problems walking related to L knee and foot/heel pain with stairs. Slight decline in vision, hearing. Mini-Cog normal.  See epicfor full screens  He has a living will and healthcare power of attorney. (he is in the process of changing)  PMH, PSH, SH and FH were updated and reviewed  Outpatient Encounter Medications as of 06/09/2019  Medication Sig Note  . aspirin 81 MG tablet Take 81 mg by mouth daily.     Marland Kitchen atorvastatin (LIPITOR) 40 MG tablet TAKE 1 TABLET(40 MG TOTAL) BY MOUTH DAILY AT 6 PM   . cholecalciferol (VITAMIN D) 1000 units tablet Take 1,000 Units by mouth daily.   Marland Kitchen esomeprazole (NEXIUM) 40 MG capsule Take 40 mg by mouth daily before breakfast.   07/19/2015: He is actually taking 2 of the OTC Nexium (getting from [facility])  . fexofenadine (ALLEGRA) 180 MG tablet Take 180 mg by mouth daily.   Marland Kitchen lisinopril (ZESTRIL) 40 MG tablet Take 1 tablet (40 mg total) by mouth daily.   . [DISCONTINUED] lisinopril (ZESTRIL) 40 MG tablet TAKE 1 TABLET(40 MG) BY MOUTH DAILY    No facility-administered  encounter medications on file as of 06/09/2019.   Allergies  Allergen Reactions  . Coconut Fatty Acids Swelling    ROS: The patient denies anorexia, fever, headaches, vision loss, decreased hearing, ear pain, hoarseness, palpitations, dizziness, syncope, dyspnea on exertion, swelling, nausea, vomiting, diarrhea, constipation, abdominal pain, melena, hematochezia, hematuria, incontinence, erectile dysfunction, nocturia, weakened urine stream, dysuria, genital lesions, numbness, tingling, weakness, tremor, suspicious skin lesions, depression, anxiety, abnormal bleeding/bruising, or enlarged lymph nodes.  Sees derm (Dr. Denna Haggard) regularly, no skin concerns. Only sleeps 4-5 hrs/night(long-term). Some intermittent hoarseness. GERD is well controlled with BID PPI.     PHYSICAL EXAM:  BP 130/74   Pulse 72   Temp (!) 96.9 F (36.1 C) (Other (Comment))   Ht 5' 9.5" (1.765 m)   Wt 235 lb (106.6 kg)   BMI 34.21 kg/m   Wt Readings from Last 3 Encounters:  06/09/19 235 lb (106.6 kg)  04/11/19 236 lb 3.2 oz (107.1 kg)  07/14/18 231 lb 6.4 oz (105 kg)    General Appearance:  Alert, cooperative, no distress, appears stated age   Head:  Normocephalic, without obvious abnormality, atraumatic   Eyes:  PERRL, conjunctiva/corneas clear, EOM's intact, fundi benign   Ears:  Normal TM's and external ear canals   Nose:  Not examined, wearing mask due to COVID-19 pandemic  Throat:  Not examined, wearing mask due to COVID-19 pandemic  Neck:  Supple, no lymphadenopathy; thyroid: no enlargement/ tenderness/nodules; no carotid bruit or JVD.  Back:  Spine nontender, no curvature, ROM normal, no CVA tenderness   Lungs:  Clear to auscultation bilaterally without wheezes, rales or ronchi; respirations unlabored   Chest Wall:  No tenderness or deformity   Heart:  Regular rate and rhythm, S1 and S2 normal,norub or gallop.3/6 SEM at L>R USB, and at apex  Breast Exam:  No chest wall tenderness, masses  or gynecomastia   Abdomen:  Soft, non-tender, nondistended, normoactive bowel sounds, no masses, no hepatosplenomegaly   Genitalia:  Deferred to urologist.   Rectal:  Deferred to urologist.   Extremities:  No clubbing, cyanosis or edema   Pulses:  2+ and symmetric all extremities   Skin:  Skin color, texture, turgor normal, no rashes or lesions noted  Lymph nodes:  Cervical, supraclavicular, and axillary nodes normal   Neurologic:  CNII-XII intact, normal strength, sensation and gait; reflexes 2+ and symmetric throughout   Psych: Normal mood, affect, hygiene and grooming   Lab Results  Component Value Date   HGBA1C 5.9 (H) 06/07/2019   Fasting glucose 113    Chemistry      Component Value Date/Time   NA 142 06/07/2019 0820   K 4.9 06/07/2019 0820   CL 103 06/07/2019 0820   CO2 24 06/07/2019 0820   BUN 14 06/07/2019 0820   CREATININE 1.11 06/07/2019 0820   CREATININE 1.12 07/19/2015 0001      Component Value Date/Time   CALCIUM 9.2 06/07/2019 0820   ALKPHOS 88 06/07/2019 0820   AST 16 06/07/2019 0820   ALT 15 06/07/2019 0820   BILITOT 0.7 06/07/2019 0820     Vitamin D-OH 37.5  Lab Results  Component Value Date   PSA1 4.1 (H) 06/07/2019   PSA 6.59 (H) 07/19/2015   PSA 4.29 (H) 11/01/2012   PSA 4.63 (H) 11/01/2012    Lab Results  Component Value Date   CHOL 114 06/07/2019   HDL 35 (L) 06/07/2019   LDLCALC 59 06/07/2019   TRIG 107 06/07/2019   CHOLHDL 3.3 06/07/2019   Lab Results  Component Value Date   WBC 7.6 06/07/2019   HGB 13.8 06/07/2019   HCT 41.4 06/07/2019   MCV 83 06/07/2019   PLT 176 06/07/2019     ASSESSMENT/PLAN:  Medicare annual wellness visit, subsequent  Elevated PSA - down from last check here (has had at urologist since then); Pt declined exam today, plans to f/u with urologist  Pure hypercholesterolemia - low HDL, otherwise at goal. Cont 40mg  atorvastatin and lowfat, low cholesterol diet  Impaired fasting  glucose - reviewed proper diet, exercise, weight loss  Atherosclerosis of native coronary artery of native heart without angina pectoris  Elevated coronary artery calcium score - cont ASA, statin, maintain normal BP  Vitamin D deficiency - continue daily supplements  Essential hypertension, benign - well controlled, continue current regimen - Plan: lisinopril (ZESTRIL) 40 MG tablet  Need for influenza vaccination - Plan: Flu Vaccine QUAD High Dose(Fluad)  Leg cramps - Ddx reviewed--rec stay well hydrated, cont K-rich foods. Trial of OTC Mg, and if not better, can try white soap ([facility]) trick  Gastroesophageal  reflux disease without esophagitis - reviewed proper diet, rec wt loss. Ultimately to get to lowest effective PPI dose. Risks reviewed, rec MVI with minerals  Class 1 obesity due to excess calories with serious comorbidity and body mass index (BMI) of 34.0 to 34.9 in adult - counseled re: proper diet, exercise, wt loss   Recommended at least 30 minutes of aerobic activity at least 5 days/week, weight bearing exercise at least 2x/week;proper sunscreen use reviewed; healthy diet and alcohol recommendations (less than or equal to 2 drinks/day) reviewed; regular seatbelt use; changing batteries in smoke detectors. Immunization recommendations discussed.Continue yearly high dose flu shots. Shingrix recommended, to get from pharmacy. Colonoscopy recommendations reviewed--UTD, due again 06/2020   MOST form filled out. Full Code, Full Care  F/u6 mos   Medicare Attestation I have personally reviewed: The patient's medical and social history Their use of alcohol, tobacco or illicit drugs Their current medications and supplements The patient's functional ability including ADLs,fall risks, home safety risks, cognitive, and hearing and visual impairment Diet and physical activities Evidence for depression or mood disorders  The patient's weight, height, BMI have  been recorded in the chart.  I have made referrals, counseling, and provided education to the patient based on review of the above and I have provided the patient with a written personalized care plan for preventive services.

## 2019-06-08 NOTE — Patient Instructions (Addendum)
HEALTH MAINTENANCE RECOMMENDATIONS:  It is recommended that you get at least 30 minutes of aerobic exercise at least 5 days/week (for weight loss, you may need as much as 60-90 minutes). This can be any activity that gets your heart rate up. This can be divided in 10-15 minute intervals if needed, but try and build up your endurance at least once a week.  Weight bearing exercise is also recommended twice weekly.  Eat a healthy diet with lots of vegetables, fruits and fiber.  "Colorful" foods have a lot of vitamins (ie green vegetables, tomatoes, red peppers, etc).  Limit sweet tea, regular sodas and alcoholic beverages, all of which has a lot of calories and sugar.  Up to 2 alcoholic drinks daily may be beneficial for men (unless trying to lose weight, watch sugars).  Drink a lot of water.  Sunscreen of at least SPF 30 should be used on all sun-exposed parts of the skin when outside between the hours of 10 am and 4 pm (not just when at beach or pool, but even with exercise, golf, tennis, and yard work!)  Use a sunscreen that says "broad spectrum" so it covers both UVA and UVB rays, and make sure to reapply every 1-2 hours.  Remember to change the batteries in your smoke detectors when changing your clock times in the spring and fall. Carbon monoxide detectors are recommended for your home.  Use your seat belt every time you are in a car, and please drive safely and not be distracted with cell phones and texting while driving.    Erik Leon , Thank you for taking time to come for your Medicare Wellness Visit. I appreciate your ongoing commitment to your health goals. Please review the following plan we discussed and let me know if I can assist you in the future.    This is a list of the screening recommended for you and due dates:  Health Maintenance  Topic Date Due  . Flu Shot  01/22/2019  . Colon Cancer Screening  07/02/2020  . Tetanus Vaccine  10/15/2020  .  Hepatitis C: One time screening  is recommended by Center for Disease Control  (CDC) for  adults born from [birth year] through 1966.   Completed  . Pneumonia vaccines  Completed   Yearly high dose flu shots are recommended (given today).  I recommend getting the new shingles vaccine (Shingrix). You will need to get this from the pharmacy rather than our office, as it is covered by Medicare part D.  It is a series of 2 injections, spaced 2 months apart.  COVID-19 vaccine is recommended when available.  We discussed cutting back carbs and sweets. We discussed getting at least 6-8 hours of sleep, and daily exercise.    MyPlate from [employer] is an outline of a general healthy diet based on the 2010 Dietary Guidelines for Americans, from the U.S. Department of Agriculture Scientist, research (physical sciences)). It sets guidelines for how much food you should eat from each food group based on your age, sex, and level of physical activity. What are tips for following MyPlate? To follow MyPlate recommendations:  Eat a wide variety of fruits and vegetables, grains, and protein foods.  Serve smaller portions and eat less food throughout the day.  Limit portion sizes to avoid overeating.  Enjoy your food.  Get at least 150 minutes of exercise every week. This is about 30 minutes each day, 5 or more days per week. It can be difficult to have  every meal look like MyPlate. Think about MyPlate as eating guidelines for an entire day, rather than each individual meal. Fruits and vegetables  Make half of your plate fruits and vegetables.  Eat many different colors of fruits and vegetables each day.  For a 2,000 calorie daily food plan, eat: ? 2 cups of vegetables every day. ? 2 cups of fruit every day.  1 cup is equal to: ? 1 cup raw or cooked vegetables. ? 1 cup raw fruit. ? 1 medium-sized orange, apple, or banana. ? 1 cup 100% fruit or vegetable juice. ? 2 cups raw leafy greens, such as lettuce, spinach, or kale. ?  cup dried fruit. Grains  One  fourth of your plate should be grains.  Make at least half of the grains you eat each day whole grains.  For a 2,000 calorie daily food plan, eat 6 oz of grains every day.  1 oz is equal to: ? 1 slice bread. ? 1 cup cereal. ?  cup cooked rice, cereal, or pasta. Protein  One fourth of your plate should be protein.  Eat a wide variety of protein foods, including meat, poultry, fish, eggs, beans, nuts, and tofu.  For a 2,000 calorie daily food plan, eat 5 oz of protein every day.  1 oz is equal to: ? 1 oz meat, poultry, or fish. ?  cup cooked beans. ? 1 egg. ?  oz nuts or seeds. ? 1 Tbsp peanut butter. Dairy  Drink fat-free or low-fat (1%) milk.  Eat or drink dairy as a side to meals.  For a 2,000 calorie daily food plan, eat or drink 3 cups of dairy every day.  1 cup is equal to: ? 1 cup milk, yogurt, cottage cheese, or soy milk (soy beverage). ? 2 oz processed cheese. ? 1 oz natural cheese. Fats, oils, salt, and sugars  Only small amounts of oils are recommended.  Avoid foods that are high in calories and low in nutritional value (empty calories), like foods high in fat or added sugars.  Choose foods that are low in salt (sodium). Choose foods that have less than 140 milligrams (mg) of sodium per serving.  Drink water instead of sugary drinks. Drink enough water each day to keep your urine pale yellow. Where to find support  Work with your health care provider or a nutrition specialist (dietitian) to develop a customized eating plan that is right for you.  Download an app (mobile application) to help you track your daily food intake. Where to find more information  Go to [email or web address] for more information.  Learn more and log your daily food intake according to MyPlate using USDA's SuperTracker: www.supertracker.usda.gov Summary  MyPlate is a general guideline for healthy eating from the USDA. It is based on the 2010 Dietary Guidelines for  Americans.  In general, fruits and vegetables should take up  of your plate, grains should take up  of your plate, and protein should take up  of your plate. This information is not intended to replace advice given to you by your health care provider. Make sure you discuss any questions you have with your health care provider. Document Released: 06/29/2007 Document Revised: 07/11/2017 Document Reviewed: 09/08/2016 Elsevier Patient Education  [place] refers to food and lifestyle choices that are based on the traditions of countries located on the [employer]. This way of eating has been shown to help prevent certain conditions and improve outcomes  for people who have chronic diseases, like kidney disease and heart disease. What are tips for following this plan? Lifestyle  Cook and eat meals together with your family, when possible.  Drink enough fluid to keep your urine clear or pale yellow.  Be physically active every day. This includes: ? Aerobic exercise like running or swimming. ? Leisure activities like gardening, walking, or housework.  Get 7-8 hours of sleep each night.  If recommended by your health care provider, drink red wine in moderation. This means 1 glass a day for nonpregnant women and 2 glasses a day for men. A glass of wine equals 5 oz (150 mL). Reading food labels   Check the serving size of packaged foods. For foods such as rice and pasta, the serving size refers to the amount of cooked product, not dry.  Check the total fat in packaged foods. Avoid foods that have saturated fat or trans fats.  Check the ingredients list for added sugars, such as corn syrup. Shopping  At the grocery store, buy most of your food from the areas near the walls of the store. This includes: ? Fresh fruits and vegetables (produce). ? Grains, beans, nuts, and seeds. Some of these may be available in unpackaged forms or  large amounts (in bulk). ? Fresh seafood. ? Poultry and eggs. ? Low-fat dairy products.  Buy whole ingredients instead of prepackaged foods.  Buy fresh fruits and vegetables in-season from local farmers markets.  Buy frozen fruits and vegetables in resealable bags.  If you do not have access to quality fresh seafood, buy precooked frozen shrimp or canned fish, such as tuna, salmon, or sardines.  Buy small amounts of raw or cooked vegetables, salads, or olives from the deli or salad bar at your store.  Stock your pantry so you always have certain foods on hand, such as olive oil, canned tuna, canned tomatoes, rice, pasta, and beans. Cooking  Cook foods with extra-virgin olive oil instead of using butter or other vegetable oils.  Have meat as a side dish, and have vegetables or grains as your main dish. This means having meat in small portions or adding small amounts of meat to foods like pasta or stew.  Use beans or vegetables instead of meat in common dishes like chili or lasagna.  Experiment with different cooking methods. Try roasting or broiling vegetables instead of steaming or sauteing them.  Add frozen vegetables to soups, stews, pasta, or rice.  Add nuts or seeds for added healthy fat at each meal. You can add these to yogurt, salads, or vegetable dishes.  Marinate fish or vegetables using olive oil, lemon juice, garlic, and fresh herbs. Meal planning   Plan to eat 1 vegetarian meal one day each week. Try to work up to 2 vegetarian meals, if possible.  Eat seafood 2 or more times a week.  Have healthy snacks readily available, such as: ? Vegetable sticks with hummus. ? [place] yogurt. ? Fruit and nut trail mix.  Eat balanced meals throughout the week. This includes: ? Fruit: 2-3 servings a day ? Vegetables: 4-5 servings a day ? Low-fat dairy: 2 servings a day ? Fish, poultry, or lean meat: 1 serving a day ? Beans and legumes: 2 or more servings a week ? Nuts and  seeds: 1-2 servings a day ? Whole grains: 6-8 servings a day ? Extra-virgin olive oil: 3-4 servings a day  Limit red meat and sweets to only a few servings a month What are  my food choices?  Mediterranean diet ? Recommended  Grains: Whole-grain pasta. Brown rice. Bulgar wheat. Polenta. Couscous. Whole-wheat bread. [place].  Vegetables: Artichokes. Beets. Broccoli. Cabbage. Carrots. Eggplant. Green beans. Chard. Kale. Spinach. Onions. Leeks. Peas. Squash. Tomatoes. Peppers. Radishes.  Fruits: Apples. Apricots. Avocado. Berries. Bananas. Cherries. Dates. Figs. Grapes. Lemons. Melon. Oranges. Peaches. Plums. Pomegranate.  Meats and other protein foods: Beans. Almonds. Sunflower seeds. Pine nuts. Peanuts. [place]. Salmon. Scallops. Shrimp. [place]. Tilapia. Clams. Oysters. Eggs.  Dairy: Low-fat milk. Cheese. Greek yogurt.  Beverages: Water. Red wine. Herbal tea.  Fats and oils: Extra virgin olive oil. Avocado oil. Grape seed oil.  Sweets and desserts: [place] yogurt with honey. Baked apples. Poached pears. Trail mix.  Seasoning and other foods: Basil. Cilantro. Coriander. Cumin. Mint. Parsley. Sage. Rosemary. Tarragon. Garlic. Oregano. Thyme. Pepper. Balsalmic vinegar. Tahini. Hummus. Tomato sauce. Olives. Mushrooms. ? Limit these  Grains: Prepackaged pasta or rice dishes. Prepackaged cereal with added sugar.  Vegetables: Deep fried potatoes (french fries).  Fruits: Fruit canned in syrup.  Meats and other protein foods: Beef. Pork. Lamb. Poultry with skin. Hot dogs. [place] Salines.  Dairy: Ice cream. Sour cream. Whole milk.  Beverages: Juice. Sugar-sweetened soft drinks. Beer. Liquor and spirits.  Fats and oils: Butter. Canola oil. Vegetable oil. Beef fat (tallow). Lard.  Sweets and desserts: Cookies. Cakes. Pies. Candy.  Seasoning and other foods: Mayonnaise. Premade sauces and marinades. The items listed may not be a complete list. Talk with your dietitian about what dietary choices  are right for you. Summary  The Mediterranean diet includes both food and lifestyle choices.  Eat a variety of fresh fruits and vegetables, beans, nuts, seeds, and whole grains.  Limit the amount of red meat and sweets that you eat.  Talk with your health care provider about whether it is safe for you to drink red wine in moderation. This means 1 glass a day for nonpregnant women and 2 glasses a day for men. A glass of wine equals 5 oz (150 mL). This information is not intended to replace advice given to you by your health care provider. Make sure you discuss any questions you have with your health care provider. Document Released: 01/31/2016 Document Revised: 02/07/2016 Document Reviewed: 01/31/2016 Elsevier Patient Education  2020 [employer].

## 2019-06-09 ENCOUNTER — Other Ambulatory Visit: Payer: Self-pay

## 2019-06-09 ENCOUNTER — Ambulatory Visit (INDEPENDENT_AMBULATORY_CARE_PROVIDER_SITE_OTHER): Payer: Medicare Other | Admitting: Family Medicine

## 2019-06-09 ENCOUNTER — Encounter: Payer: Self-pay | Admitting: Family Medicine

## 2019-06-09 VITALS — BP 130/74 | HR 72 | Temp 96.9°F | Ht 69.5 in | Wt 235.0 lb

## 2019-06-09 DIAGNOSIS — R972 Elevated prostate specific antigen [PSA]: Secondary | ICD-10-CM | POA: Diagnosis not present

## 2019-06-09 DIAGNOSIS — I2510 Atherosclerotic heart disease of native coronary artery without angina pectoris: Secondary | ICD-10-CM

## 2019-06-09 DIAGNOSIS — R252 Cramp and spasm: Secondary | ICD-10-CM

## 2019-06-09 DIAGNOSIS — Z0000 Encounter for general adult medical examination without abnormal findings: Secondary | ICD-10-CM | POA: Diagnosis not present

## 2019-06-09 DIAGNOSIS — I10 Essential (primary) hypertension: Secondary | ICD-10-CM

## 2019-06-09 DIAGNOSIS — Z6834 Body mass index (BMI) 34.0-34.9, adult: Secondary | ICD-10-CM

## 2019-06-09 DIAGNOSIS — E559 Vitamin D deficiency, unspecified: Secondary | ICD-10-CM

## 2019-06-09 DIAGNOSIS — E6609 Other obesity due to excess calories: Secondary | ICD-10-CM

## 2019-06-09 DIAGNOSIS — K219 Gastro-esophageal reflux disease without esophagitis: Secondary | ICD-10-CM

## 2019-06-09 DIAGNOSIS — R931 Abnormal findings on diagnostic imaging of heart and coronary circulation: Secondary | ICD-10-CM | POA: Diagnosis not present

## 2019-06-09 DIAGNOSIS — Z23 Encounter for immunization: Secondary | ICD-10-CM | POA: Diagnosis not present

## 2019-06-09 DIAGNOSIS — R7301 Impaired fasting glucose: Secondary | ICD-10-CM

## 2019-06-09 DIAGNOSIS — E66811 Obesity, class 1: Secondary | ICD-10-CM

## 2019-06-09 DIAGNOSIS — E7800 Pure hypercholesterolemia, unspecified: Secondary | ICD-10-CM

## 2019-06-09 MED ORDER — LISINOPRIL 40 MG PO TABS: 40.0000 mg | ORAL_TABLET | Freq: Every day | ORAL | 1 refills | Status: DC

## 2019-10-21 DIAGNOSIS — H2513 Age-related nuclear cataract, bilateral: Secondary | ICD-10-CM | POA: Diagnosis not present

## 2019-11-14 ENCOUNTER — Encounter: Payer: Self-pay | Admitting: *Deleted

## 2019-11-17 ENCOUNTER — Other Ambulatory Visit: Payer: Self-pay

## 2019-11-17 ENCOUNTER — Ambulatory Visit (INDEPENDENT_AMBULATORY_CARE_PROVIDER_SITE_OTHER): Payer: Medicare Other | Admitting: Physician Assistant

## 2019-11-17 ENCOUNTER — Encounter: Payer: Self-pay | Admitting: Physician Assistant

## 2019-11-17 DIAGNOSIS — L570 Actinic keratosis: Secondary | ICD-10-CM | POA: Diagnosis not present

## 2019-11-17 DIAGNOSIS — D485 Neoplasm of uncertain behavior of skin: Secondary | ICD-10-CM

## 2019-11-17 DIAGNOSIS — L430 Hypertrophic lichen planus: Secondary | ICD-10-CM | POA: Diagnosis not present

## 2019-11-17 DIAGNOSIS — Z85828 Personal history of other malignant neoplasm of skin: Secondary | ICD-10-CM

## 2019-11-17 DIAGNOSIS — C44311 Basal cell carcinoma of skin of nose: Secondary | ICD-10-CM | POA: Diagnosis not present

## 2019-11-17 DIAGNOSIS — Z87898 Personal history of other specified conditions: Secondary | ICD-10-CM

## 2019-11-17 DIAGNOSIS — Z86018 Personal history of other benign neoplasm: Secondary | ICD-10-CM

## 2019-11-17 DIAGNOSIS — D3612 Benign neoplasm of peripheral nerves and autonomic nervous system, upper limb, including shoulder: Secondary | ICD-10-CM | POA: Diagnosis not present

## 2019-11-17 NOTE — Patient Instructions (Signed)

## 2019-11-17 NOTE — Progress Notes (Signed)
Follow up Visit  Subjective  Erik Leon is a 69 y.o. male who presents for the following: Follow-up (LN2 ON MORE SPOTS). He has 5 lesions to check. One is on the front of his right thigh. It has been there 3 years and comes and goes. No pain or bleeding. He has a bump on his left palm that has been there since he was a teenager. It is getting in the way and he has cuaght it recently and it has bled. He has an very small area in front of the right ear and on the left nose that has bled.   Objective  Well appearing patient in no apparent distress; mood and affect are within normal limits.  A focused examination was performed including face, neck, chest, arms and right thigh and back. Relevant physical exam findings are noted in the Assessment and Plan.   Objective  Neck - Posterior (3), Right Breast, Right Upper Back: Surgical scar examined no sign of recurrence right posterior shoulder.   Objective  left upper nose: 3 cm to nose crease Pink macule  Objective  Right Thigh - Anterior: 2.5 to angioma Pink scaling patch  Objective  Right side burn: 1.0 to lower ear junction Small scaling area  Objective  Left palm: No measurment per JCB Flesh colored papule  Objective  mid bridge nose: 4 TO NOSE CREASE Grey papule  Assessment & Plan  History of atypical nevus (4) Right Abdomen (side) - Upper; Right Medial Thigh; Right Upper Back; Left Lower Back  History of basal cell carcinoma (BCC) (5) Neck - Posterior (3); Right Breast; Right Upper Back  Neoplasm of uncertain behavior of skin (5) left upper nose  Skin / nail biopsy Type of biopsy: tangential   Informed consent: discussed and consent obtained   Timeout: patient name, date of birth, surgical site, and procedure verified   Anesthesia: the lesion was anesthetized in a standard fashion   Anesthetic:  1% lidocaine w/ epinephrine 1-100,000 local infiltration Instrument used: flexible razor blade   Hemostasis  achieved with: ferric subsulfate   Outcome: patient tolerated procedure well   Post-procedure details: wound care instructions given    Specimen 1 - Surgical pathology Differential Diagnosis: scc vs bss Check Margins: No  Right Thigh - Anterior  Skin / nail biopsy Type of biopsy: tangential   Informed consent: discussed and consent obtained   Timeout: patient name, date of birth, surgical site, and procedure verified   Instrument used: flexible razor blade   Hemostasis achieved with: ferric subsulfate   Outcome: patient tolerated procedure well   Post-procedure details: wound care instructions given    Specimen 2 - Surgical pathology Differential Diagnosis: scc vs bcc Check Margins: No  Right side burn  Skin / nail biopsy Type of biopsy: tangential   Informed consent: discussed and consent obtained   Timeout: patient name, date of birth, surgical site, and procedure verified   Instrument used: flexible razor blade   Hemostasis achieved with: ferric subsulfate   Outcome: patient tolerated procedure well   Post-procedure details: wound care instructions given    Specimen 3 - Surgical pathology Differential Diagnosis: scc vs bcc Check Margins: No  Left palm  Skin / nail biopsy Type of biopsy: tangential   Informed consent: discussed and consent obtained   Timeout: patient name, date of birth, surgical site, and procedure verified   Anesthesia: the lesion was anesthetized in a standard fashion   Anesthetic:  1% lidocaine w/ epinephrine 1-100,000  local infiltration Instrument used: flexible razor blade   Hemostasis achieved with: ferric subsulfate   Outcome: patient tolerated procedure well   Post-procedure details: wound care instructions given    Specimen 4 - Surgical pathology Differential Diagnosis: scc vs bcc Check Margins: No  mid bridge nose  Skin / nail biopsy Type of biopsy: tangential   Informed consent: discussed and consent obtained   Timeout:  patient name, date of birth, surgical site, and procedure verified   Anesthesia: the lesion was anesthetized in a standard fashion   Anesthetic:  1% lidocaine w/ epinephrine 1-100,000 local infiltration Instrument used: flexible razor blade   Hemostasis achieved with: ferric subsulfate   Outcome: patient tolerated procedure well   Post-procedure details: wound care instructions given    Specimen 5 - Surgical pathology Differential Diagnosis: scc vs bcc Check Margins: No

## 2019-11-18 DIAGNOSIS — C4491 Basal cell carcinoma of skin, unspecified: Secondary | ICD-10-CM | POA: Insufficient documentation

## 2019-11-18 HISTORY — DX: Basal cell carcinoma of skin, unspecified: C44.91

## 2019-11-23 ENCOUNTER — Telehealth: Payer: Self-pay | Admitting: Physician Assistant

## 2019-11-23 NOTE — Telephone Encounter (Signed)
Phone call to patient to inform him that we haven't received his results yet but once we do we will give him a call.  Patient aware.

## 2019-11-23 NOTE — Telephone Encounter (Signed)
Patient calling for his biopsy results. He was informed of 24-48 hour turn around time for call back, he voiced understanding.

## 2019-11-24 ENCOUNTER — Telehealth: Payer: Self-pay

## 2019-11-24 NOTE — Telephone Encounter (Signed)
Phone call to patient with his Pathology results.  Voicemail left for patient to give the office a call back.

## 2019-11-24 NOTE — Telephone Encounter (Signed)
I'm sure it is still scabbed and needs to heal so we should give it a couple weeks before we look at it. May end up needing hand surgeon. 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

## 2019-11-24 NOTE — Telephone Encounter (Signed)
-----   Message from Arlyss Gandy, [place] sent at 11/24/2019  8:50 AM EDT ----- Mohs for mid nose. Also ask how hand is healing.

## 2019-11-24 NOTE — Telephone Encounter (Signed)
Phone call from patient returning our call for his Pathology results and Carolan Shiver, PA recommendations.  Patient aware of pathology results and recommendations.  Patient states that his hand still has the raised spot that is irritating him and he would like [facility], PAto look at it again.  Message given to [facility].

## 2019-11-28 NOTE — Telephone Encounter (Signed)
Phone call from patient returning our call.  Patient given Erik Leon's recommendations.  Patient wanted to know who we use for hand surgeon, patient given Dr. [name] name and number [phone].  Patient states he'll call Dr. [name] office and schedule an appointment.

## 2019-11-28 NOTE — Telephone Encounter (Signed)
Phone call to patient with Erik Leon's recommendations.  Voicemail left for patient to give the office a call back.

## 2019-12-04 ENCOUNTER — Other Ambulatory Visit: Payer: Self-pay | Admitting: Family Medicine

## 2019-12-04 DIAGNOSIS — I10 Essential (primary) hypertension: Secondary | ICD-10-CM

## 2019-12-05 ENCOUNTER — Telehealth: Payer: Self-pay | Admitting: Physician Assistant

## 2019-12-05 DIAGNOSIS — R2232 Localized swelling, mass and lump, left upper limb: Secondary | ICD-10-CM | POA: Diagnosis not present

## 2019-12-05 NOTE — Telephone Encounter (Signed)
Dr [name] office seen this patient this morning. They are requesting pathology report of left palm. Fax [phone]

## 2019-12-05 NOTE — Telephone Encounter (Signed)
Patient's pathology results faxed over to Dr. [name] office of his Left Palm @ [phone].

## 2019-12-06 ENCOUNTER — Other Ambulatory Visit: Payer: Self-pay | Admitting: Orthopedic Surgery

## 2019-12-06 DIAGNOSIS — R2232 Localized swelling, mass and lump, left upper limb: Secondary | ICD-10-CM

## 2019-12-07 NOTE — Progress Notes (Signed)
Chief Complaint  Patient presents with   Hypertension    nonfasting med check. Has not seen urolgist but is going to schedule. No new concerns.     Patient presents for 6 month follow-up.  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 He started at 240.2# on 6/14.  Doing keto program, but interested in [place]. He states that he eats very little, but admits to eating cake/ice cream.  Got rid of everything in his house this week. Since he thinks he had COVID last year, his taste has been diminished, he only tastes sugar. He has been making food spicier to try and taste it more (and therefore also having more reflux, see below). Loves to shop daily for fruits/vegetables.  He recently saw dermatologist--had [place] on his nose, will need Moh's.  Scheduled for 7/29. Had a neurofibroma removed from his L palm. He was referred to Dr. Fredna Dow, but he didn't have path report at time of his visit.  Ordered US (concern for FB, pyogenic granuloma)  At his last visit, he was complaining of leg cramps 4/7 nights of the week--in feet, legs, inner thighs, more on the left than the right LE.  He was drinking a lot of water--24 pack of water every 3 days, but also drinking 10-15 cups of coffee/day.  He reports that he cut back to 3 cups/coffee, and drinks 64 oz of water daily.  No longer having leg cramps.  Hypertension:He is compliant in taking lisinopril 40mg  daily.  He reports that his blood pressure is running118-145/60's, usually average 125/65. Denies headaches, side effects of medications (slight cough/throat clearing from ACEI, tolerable and unchanged x years, not bothersome). Deniesdizziness, chest pain,edema.  h/o hypertrophic obstructive cardiomyopathy (mild).Hehad repeatechocardiogram in 02/2016 which showed normal LV size with mild  LV hypertrophy. EF 60-65%. Normal RV size and systolic function. No significant valvular abnormalities. He was noted to have elevated Calcium score in 02/2018, and was started on atorvastatin 40mg . He denies side effects, and reports compliance. Lipids were at goal on last check: Lab Results  Component Value Date   CHOL 114 06/07/2019   HDL 35 (L) 06/07/2019   LDLCALC 59 06/07/2019   TRIG 107 06/07/2019   CHOLHDL 3.3 06/07/2019   Vitamin D deficiency--Level was low at 16.8 in 06/2017. He was put on 12 weeks of prescription vitamin D.Follow-up level was normal at 35.9 in 12/2017. Last level was normal at 37.6 in 05/2019. He continues to take 1000 IU daily.  Impaired fasting glucose--sugar was 107on his labs in July, 2019.Sugar was 92 in 06/2018, 113 in 05/2019 Lab Results  Component Value Date   HGBA1C 5.9 (H) 06/07/2019   See above.  Tends to eat a lot of sweets (because he can taste them, and likes them). Making dietary changes as stated above.  H/o elevated PSA.He previously saw urologist,had benign biopsies in the past. He was reminded at his wellness visit that he was past due; he declined exam here, and reported he would schedule a visit at [facility]. PSA was 4.1 in 05/2019.  He has not yet called to schedule, but plans to.  GERD: He is taking 2 of the OTC Nexium BID. He tends to have reflux symptoms if he eats after 7:30 pm.  He is taking the second dose of Nexium at bedtime.  Eating spicier foods since taste  altered.  He occasionally will feel like food gets stuck.  Previously had dilatation by Dr. Earlean Shawl in the past.  Altered taste:  He tells me today that he thinks he had COVID last March.  Upon chart review, he had sent a message late March asking about testing, but had reported that his illness was in January/February, and there wasn't really much discussion about his symptoms at the time.  He states that he had antibody testing done in July at a clinic in [facility], showing +  antibodies for COVID.  His taste has remained diminished.  Smell is normal.  He has not yet gotten COVID vaccines, wants to wait longer.   PMH, PSH, SH reviewed  Outpatient Encounter Medications as of 12/08/2019  Medication Sig Note   aspirin 81 MG tablet Take 81 mg by mouth daily.      atorvastatin (LIPITOR) 40 MG tablet TAKE 1 TABLET(40 MG TOTAL) BY MOUTH DAILY AT 6 PM    cholecalciferol (VITAMIN D) 1000 units tablet Take 1,000 Units by mouth daily.    fexofenadine (ALLEGRA) 180 MG tablet Take 180 mg by mouth daily.    lisinopril (ZESTRIL) 40 MG tablet Take 1 tablet (40 mg total) by mouth daily.    omeprazole (PRILOSEC) 20 MG capsule Take 40 mg by mouth 2 (two) times daily before a meal.    [DISCONTINUED] esomeprazole (NEXIUM) 40 MG capsule Take 40 mg by mouth daily before breakfast.   07/19/2015: He is actually taking 2 of the OTC Nexium (getting from [employer])   [DISCONTINUED] lisinopril (ZESTRIL) 40 MG tablet Take 1 tablet (40 mg total) by mouth daily.    No facility-administered encounter medications on file as of 12/08/2019.   Allergies  Allergen Reactions   Coconut Fatty Acids Swelling    ROS: no fever, chills, URI symptoms, headaches, dizziness, chest pain, palpitations, edema. Denies bleeding, bruising, rash. Denies nausea, vomiting, bowel changes.  Reflux flaring per HPI.   Intermittent/infrequent dysphagia. Altered taste per HPI.  No longer getting any muscle cramps.  No urinary complaints.    PHYSICAL EXAM:  BP 128/78    Pulse 80    Temp 98.4 F (36.9 C) (Temporal)    Ht 5\' 9"  (1.753 m)    Wt 236 lb (107 kg)    BMI 34.85 kg/m   Wt Readings from Last 3 Encounters:  12/08/19 236 lb (107 kg)  06/09/19 235 lb (106.6 kg)  04/11/19 236 lb 3.2 oz (107.1 kg)   Pleasant, well-appearing male, in good spirits, in no distress HEENT: conjunctiva and sclera are clear, EOMI, wearing mask Neck: no lymphadenopathy, thyromegaly or mass Heart: regular rate and rhythm, +murmur  noted, 3/6 throughout, unchanged Lungs: clear bilaterally Back:  No spinal or CVA tenderness Abdomen: soft, obese, nontender, no mass Extremities: no edema, 2+ pulses Skin: normal turgor. Scars visible on nose above mask. No rashes Psych: normal mood, affect, hygiene and grooming Neuro: alert, oriented, normal strength and gait  Lab Results  Component Value Date   HGBA1C 5.7 (A) 12/08/2019     ASSESSMENT/PLAN:  Impaired fasting glucose - This has improved some. Counseled re: proper diet, daily exercise, weight loss - Plan: HgB A1c  Pure hypercholesterolemia - at goal per last check, cont statin  Elevated coronary artery calcium score - cont ASA, statin. daily exercise and wt loss rec  Essential hypertension, benign - well controlled, continue current regimen - Plan: lisinopril (ZESTRIL) 40 MG tablet  Class 1 obesity due to excess calories with  serious comorbidity and body mass index (BMI) of 34.0 to 34.9 in adult - counseled re: risks of obesity, weight loss  Vaccine counseling - counseled and encouraged COVID vaccination  Altered taste - he reports he had +COVID Ab test 12/2018, so likely a remnant of COVID infection   Reminded to contact Dr. Louis Meckel to arrange visit.  Counseled about COVID disease (and if he gets recurrent symptoms, to seek care for potential monoclonal antibody treatment within 10 days of symptoms), and counseled about vaccine.  Strongly encouraged vaccine.  Counseled in detail about diet--healthy diet, didn't recommend keto, fine with him trying [employer], discussed what it entails and he feels like he could follow. Discussed with his altered taste that it might make it easier for him to look at food more clinically, rather than based on desire or cravings--getting what nutrients his body needs to function best.  Encouraged regular exercise.  F/u 6 months at [facility], sooner prn.

## 2019-12-08 ENCOUNTER — Encounter: Payer: Self-pay | Admitting: Family Medicine

## 2019-12-08 ENCOUNTER — Other Ambulatory Visit: Payer: Self-pay

## 2019-12-08 ENCOUNTER — Ambulatory Visit (INDEPENDENT_AMBULATORY_CARE_PROVIDER_SITE_OTHER): Payer: Medicare Other | Admitting: Family Medicine

## 2019-12-08 VITALS — BP 128/78 | HR 80 | Temp 98.4°F | Ht 69.0 in | Wt 236.0 lb

## 2019-12-08 DIAGNOSIS — I10 Essential (primary) hypertension: Secondary | ICD-10-CM

## 2019-12-08 DIAGNOSIS — R931 Abnormal findings on diagnostic imaging of heart and coronary circulation: Secondary | ICD-10-CM | POA: Diagnosis not present

## 2019-12-08 DIAGNOSIS — Z7189 Other specified counseling: Secondary | ICD-10-CM

## 2019-12-08 DIAGNOSIS — R432 Parageusia: Secondary | ICD-10-CM

## 2019-12-08 DIAGNOSIS — E6609 Other obesity due to excess calories: Secondary | ICD-10-CM

## 2019-12-08 DIAGNOSIS — Z6834 Body mass index (BMI) 34.0-34.9, adult: Secondary | ICD-10-CM

## 2019-12-08 DIAGNOSIS — E66811 Obesity, class 1: Secondary | ICD-10-CM

## 2019-12-08 DIAGNOSIS — R7301 Impaired fasting glucose: Secondary | ICD-10-CM | POA: Diagnosis not present

## 2019-12-08 DIAGNOSIS — Z7185 Encounter for immunization safety counseling: Secondary | ICD-10-CM

## 2019-12-08 DIAGNOSIS — E7800 Pure hypercholesterolemia, unspecified: Secondary | ICD-10-CM

## 2019-12-08 LAB — POCT GLYCOSYLATED HEMOGLOBIN (HGB A1C): Hemoglobin A1C: 5.7 % — AB (ref 4.0–5.6)

## 2019-12-08 MED ORDER — LISINOPRIL 40 MG PO TABS: 40.0000 mg | ORAL_TABLET | Freq: Every day | ORAL | 1 refills | Status: DC

## 2019-12-08 NOTE — Patient Instructions (Addendum)
Try taking your evening Nexium with dinner rather than at bedtime. Cut back on the spicy food.  Continue all of your current medications. I highly encourage you to get COVID vaccine (even though you likely had it last year, the antibodies may not last, and it is protective against some of the other variants). If you get recurrent COVID illness, remember we spoke about monoclonal antibody treatment--needs to be started within 10 days of symptoms.  Please call [facility] to schedule your routine visit.

## 2019-12-09 ENCOUNTER — Encounter: Payer: Self-pay | Admitting: Family Medicine

## 2019-12-16 ENCOUNTER — Encounter: Payer: Self-pay | Admitting: Family Medicine

## 2020-01-19 DIAGNOSIS — C44311 Basal cell carcinoma of skin of nose: Secondary | ICD-10-CM | POA: Diagnosis not present

## 2020-02-02 ENCOUNTER — Encounter: Payer: Self-pay | Admitting: Family Medicine

## 2020-02-09 ENCOUNTER — Encounter: Payer: Self-pay | Admitting: Family Medicine

## 2020-02-20 DIAGNOSIS — Z23 Encounter for immunization: Secondary | ICD-10-CM | POA: Diagnosis not present

## 2020-03-07 ENCOUNTER — Other Ambulatory Visit: Payer: Self-pay | Admitting: Cardiology

## 2020-04-04 ENCOUNTER — Ambulatory Visit: Payer: Medicare Other | Admitting: Cardiology

## 2020-04-13 ENCOUNTER — Other Ambulatory Visit: Payer: Self-pay | Admitting: Cardiology

## 2020-04-17 ENCOUNTER — Other Ambulatory Visit: Payer: Self-pay

## 2020-04-17 ENCOUNTER — Ambulatory Visit (INDEPENDENT_AMBULATORY_CARE_PROVIDER_SITE_OTHER): Payer: Medicare Other | Admitting: Cardiology

## 2020-04-17 ENCOUNTER — Encounter: Payer: Self-pay | Admitting: Cardiology

## 2020-04-17 VITALS — BP 152/78 | HR 71 | Ht 70.0 in | Wt 243.0 lb

## 2020-04-17 DIAGNOSIS — I2510 Atherosclerotic heart disease of native coronary artery without angina pectoris: Secondary | ICD-10-CM | POA: Diagnosis not present

## 2020-04-17 DIAGNOSIS — I259 Chronic ischemic heart disease, unspecified: Secondary | ICD-10-CM

## 2020-04-17 DIAGNOSIS — I421 Obstructive hypertrophic cardiomyopathy: Secondary | ICD-10-CM | POA: Diagnosis not present

## 2020-04-17 DIAGNOSIS — E7800 Pure hypercholesterolemia, unspecified: Secondary | ICD-10-CM | POA: Diagnosis not present

## 2020-04-17 DIAGNOSIS — I10 Essential (primary) hypertension: Secondary | ICD-10-CM | POA: Diagnosis not present

## 2020-04-17 MED ORDER — LISINOPRIL 40 MG PO TABS: 40.0000 mg | ORAL_TABLET | Freq: Every day | ORAL | 3 refills | Status: DC

## 2020-04-17 MED ORDER — ATORVASTATIN CALCIUM 40 MG PO TABS: ORAL_TABLET | ORAL | 3 refills | Status: DC

## 2020-04-17 MED ORDER — ISOSORBIDE MONONITRATE ER 30 MG PO TB24: 30.0000 mg | ORAL_TABLET | Freq: Every day | ORAL | 3 refills | Status: DC

## 2020-04-17 NOTE — Progress Notes (Signed)
Cardiology Office Note:    Date:  04/17/2020   ID:  Erik Leon, DOB 01-02-1951, MRN [id number]  PCP:  Erik Ohara, MD  Cardiologist:  Erik Dresser, MD PhD  Referring MD: Erik Ohara, MD   CC: follow up, palpitations  History of Present Illness:    Erik Leon is a 69 y.o. male with a hx of hypertension and mild HOCM who is seen for follow up. He was initially seen as a new consult at the request of Dr. Tomi Leon for evaluation of chest pain 02/01/18.  Cardiac history: Chest pain 2019: intermittent. No aggravating factors. Last 10 seconds. If he exerts himself, feels that his pulse goes faster, but doesn't get discomfort. Started several years ago. Happens once every couple months. Last occurrence two weeks ago, occurred while sitting at his desk. Feels like when "you swallow something and it goes down the wrong pipe." Feels like it is under his heart. No radiation, no associated symptoms. He reports multiple prior stress tests, though I do not have the records. Noted as stress in Epic in 2013, but only normal echo results attached.  FH: no known CAD. Brother with congenital cardiac defect (below), may have passed from SCD.  HOCM: echo in 2013 pdf reports SAM, though I cannot see additional details. Noted as mild LVH, I do not have measurements or gradients. Echo here in 2017 noted septal thickness as 1.5 cm and posterior wall thickness as 1.37 cm. No gradient noted.   Today: Chest pain: First noticed a few months ago. Has paid more attention over the last few months, has noted nearly daily chest discomfort. Very brief, feels like he has a knot behind his heart. Can occur at any time.  Not affecting by exertion--able to walk through the [facility] without issues beyond shortness of breath. Not positional, but does have some relation after he eats. He does feel that this has limited his ability to be active. We discussed the difference between stable/exertional angina and unstable  angina, vs other potential cause.   BP this AM 110/75. Has a mild cough on lisinopril, discussed changing, but he has had issues with other medications and feels that this is not bothersome. Discussed what to watch for.  Denies shortness of breath at rest. No PND, orthopnea, LE edema or unexpected weight gain. No syncope or palpitations.   Past Medical History:  Diagnosis Date  . Allergy   . Atypical mole 11/23/2000   slight to moderate- Right upper back (WS)  . Atypical mole 11/23/2000   slight to moderate-right uppermost inner thigh (WS)  . Atypical mole 10/24/2009   severe-right mid abdomen (EXC)  . Atypical mole 01/06/2019   left mi back   . Basal cell carcinoma 09/14/2012   superficial-left upper shoulder (txpbx)  . Basal cell carcinoma 11/26/2017   sup + nod-right chest (CX35FU)  . Basal cell carcinoma 01/06/2019   sup-right post shoulder (CX35FU)  . BCC (basal cell carcinoma of skin) 10/24/2009   top right shoulder (CX35FU)  . Calcaneal fracture 2014   right; s/p boot; reinjured in 2019  . Colon polyp   . Diverticulosis 6/09   rare L colon diverticuli  . Elevated PSA 10/2012   benign prostate biopsies 11/2012  . Esophageal stricture    s/p dilatation (Dr. Earlean Leon)  . GERD (gastroesophageal reflux disease)   . Heart murmur   . Hypertension   . Hypertr obst cardiomyop    mild--Dr. Radford Pax (previously)  . Internal hemorrhoid 6/09  .  Migraine age 27   resolved after nasal surgery  . Nodular basal cell carcinoma (BCC) 11/18/2019   [place] Nose    Past Surgical History:  Procedure Laterality Date  . CHOLECYSTECTOMY  5/03  . COLONOSCOPY  11/2007, 2011, 2014   Dr. Rudi Leon again 2019  . dental implant    . ELBOW SURGERY Right 06/2013   bone spur; Dr. Percell Leon  . ESOPHAGOGASTRODUODENOSCOPY  2012   with dilatation.  Dr. Earlean Leon  . LIPOMA EXCISION     Dr. Stanford Leon from L paramedian prevertebral space  . NASAL POLYP SURGERY  2008  . SHOULDER ARTHROSCOPY   12/03; 1/06   right; left    Current Medications: Current Outpatient Medications on File Prior to Visit  Medication Sig  . aspirin 81 MG tablet Take 81 mg by mouth daily.    Marland Kitchen atorvastatin (LIPITOR) 40 MG tablet TAKE 1 TABLET(40 MG) BY MOUTH DAILY AT 6 PM  . cholecalciferol (VITAMIN D) 1000 units tablet Take 1,000 Units by mouth daily.  . fexofenadine (ALLEGRA) 180 MG tablet Take 180 mg by mouth daily.  Marland Kitchen lisinopril (ZESTRIL) 40 MG tablet Take 1 tablet (40 mg total) by mouth daily.  Marland Kitchen omeprazole (PRILOSEC) 20 MG capsule Take 40 mg by mouth 2 (two) times daily before a meal.   No current facility-administered medications on file prior to visit.     Allergies:   Coconut fatty acids   Social History   Tobacco Use  . Smoking status: Former Smoker    Quit date: 06/24/1983    Years since quitting: 37.0  . Smokeless tobacco: Never Used  Vaping Use  . Vaping Use: Never used  Substance Use Topics  . Alcohol use: Yes    Comment: 1-2 drink/month (scotch) or less  . Drug use: No    Family History: The patient's family history includes Alcohol abuse in his father; Cancer in his mother; Dementia in his mother; Hypertension in his mother. There is no history of Diabetes, Prostate cancer, or Colon cancer. Half-brother had a birth defect and died after playing handball at age 33. Not HCM as far as he knows. There were 11 sons, no other with heart problems. Not sure about his dad's history, he was an alcoholic. No MI or stroke.  ROS:   Please see the history of present illness.  Additional pertinent ROS otherwise unremarkable.  EKGs/Labs/Other Studies Reviewed:    The following studies were reviewed today: Echo 2013: (pdf scan)--noted as stress, but only resting echo in note EF >70%, mild cLVH. +SAM, trace MR  Echo 2017:  Study Conclusions - Left ventricle: The cavity size was normal. Wall thickness was   increased in a pattern of mild LVH. Systolic function was normal.   The estimated  ejection fraction was in the range of 60% to 65%.   Wall motion was normal; there were no regional wall motion   abnormalities. Doppler parameters are consistent with abnormal   left ventricular relaxation (grade 1 diastolic dysfunction). - Aortic valve: There was no stenosis. - Mitral valve: There was trivial regurgitation. - Right ventricle: The cavity size was normal. Systolic function   was normal. - Pulmonary arteries: No complete TR doppler jet so unable to   estimate PA systolic pressure. - Inferior vena cava: The vessel was normal in size. The   respirophasic diameter changes were in the normal range (>= 50%),   consistent with normal central venous pressure.  Impressions:  - Normal LV size with mild LV  hypertrophy. EF 60-65%. Normal RV   size and systolic function. No significant valvular   abnormalities.  CT cardiac 03/03/2018 FINDINGS: The pulmonary veins drained normally to the left atrium. Calcium Score: 198 Agatston units Coronary Arteries: Right dominant with no anomalies LM: Short, no plaque or stenosis. LAD system: Relatively small caliber vessel. Mixed plaque proximal and mid LAD, appears to be mild stenosis. Circumflex system: Large LCx. Mixed plaque mid vessel without significant stenosis. RCA system: Calcified plaque distal RCA/PLV with mild stenosis.  IMPRESSION: 1. Coronary artery calcium score 198 Agatston units. This places the patient in the 65th percentile for age and gender, suggesting intermediate risk for future cardiac events. 2. Extensive plaque in the proximal and mid LAD. Appears to be mild stenosis. 3.  Mild stenosis in distal RCA/PLV.  FFR without hemodynamically significant stenosis  EKG:  ECG personally reviewed, ECG from today shows NSR with nonspecific ST changes, HR 71 bpm  Recent Labs: 06/07/2019: ALT 15; BUN 14; Creatinine, Ser 1.11; Hemoglobin 13.8; Platelets 176; Potassium 4.9; Sodium 142  Recent Lipid Panel    Component  Value Date/Time   CHOL 114 06/07/2019 0820   TRIG 107 06/07/2019 0820   HDL 35 (L) 06/07/2019 0820   CHOLHDL 3.3 06/07/2019 0820   CHOLHDL 4.0 02/21/2016 1035   VLDL 22 02/21/2016 1035   LDLCALC 59 06/07/2019 0820    Physical Exam:    VS:  BP (!) 152/78   Pulse 71   Ht 5\' 10"  (1.778 m)   Wt 243 lb (110.2 kg)   SpO2 95%   BMI 34.87 kg/m     Wt Readings from Last 3 Encounters:  04/17/20 243 lb (110.2 kg)  12/08/19 236 lb (107 kg)  06/09/19 235 lb (106.6 kg)    GEN: Well nourished, well developed in no acute distress HEENT: Normal, moist mucous membranes NECK: No JVD CARDIAC: regular rhythm, normal S1 and S2, no rubs or gallops. 1/6 systolic ejection murmur, no change with valsalva. VASCULAR: Radial and DP pulses 2+ bilaterally. No carotid bruits RESPIRATORY:  Clear to auscultation without rales, wheezing or rhonchi  ABDOMEN: Soft, non-tender, non-distended MUSCULOSKELETAL:  Ambulates independently SKIN: Warm and dry, no edema NEUROLOGIC:  Alert and oriented x 3. No focal neuro deficits noted. PSYCHIATRIC:  Normal affect   ASSESSMENT:    1. Chest pain due to myocardial ischemia, unspecified ischemic chest pain type   2. Essential hypertension, benign   3. Nonocclusive coronary atherosclerosis of native coronary artery   4. Mild HOCM (hypertrophic obstructive cardiomyopathy) ([place])   5. Essential hypertension   6. Pure hypercholesterolemia    PLAN:    Chest pain, known nonobstructive CAD: -coronary CT 02/2018 with Ca score 198, prox/mid LAD plaque but no significant stenosis by FFR -will trial imdur, monitor symptoms -instructed on red flag warning signs that need immediate medical attention -continue aspirin, atorvastatin  HOCM, mild -very mild murmur -Predominantly focal septal hypertrophy. Max LVOT gradient noted on imaging was 12 mmHg. -asymptomatic, no syncope, no SCD in family related to HCM (though more information on half-brother's condition would be  helpful). -had echo in 2017, will repeat in the future based on symptoms vs. Routine monitoring  Hypertension: component of white coat hypertension as elevated in the office but excellent control at home -continue lisinopril, starting imdur as above -goal <130/80, call if home numbers rise  Hypercholesterolemia: LDL goal <70.  -continue atorvastatin  Secondary prevention -recommend heart healthy/Mediterranean diet, with whole grains, fruits, vegetable, fish, lean meats, nuts, and  olive oil. Limit salt. -recommend moderate walking, 3-5 times/week for 30-50 minutes each session. Aim for at least 150 minutes.week. Goal should be pace of 3 miles/hours, or walking 1.5 miles in 30 minutes -recommend avoidance of tobacco products. Avoid excess alcohol.  Plan for follow up: 3 weeks  Medication Adjustments/Labs and Tests Ordered: Current medicines are reviewed at length with the patient today.  Concerns regarding medicines are outlined above.  Orders Placed This Encounter  Procedures  . EKG 12-Lead   Meds ordered this encounter  Medications  . lisinopril (ZESTRIL) 40 MG tablet    Sig: Take 1 tablet (40 mg total) by mouth daily.    Dispense:  90 tablet    Refill:  3  . atorvastatin (LIPITOR) 40 MG tablet    Sig: TAKE 1 TABLET(40 MG) BY MOUTH DAILY AT 6 PM    Dispense:  90 tablet    Refill:  3  . DISCONTD: isosorbide mononitrate (IMDUR) 30 MG 24 hr tablet    Sig: Take 1 tablet (30 mg total) by mouth daily.    Dispense:  90 tablet    Refill:  3    Patient Instructions  Medication Instructions:  Start Imdur 30 mg daily  *If you need a refill on your cardiac medications before your next appointment, please call your pharmacy*   Lab Work: None ordered    Testing/Procedures: None ordered    Follow-Up: At [facility], you and your health needs are our priority.  As part of our continuing mission to provide you with exceptional heart care, we have created designated Provider  Care Teams.  These Care Teams include your primary Cardiologist (physician) and Advanced Practice Providers (APPs -  Physician Assistants and Nurse Practitioners) who all work together to provide you with the care you need, when you need it.  We recommend signing up for the patient portal called "MyChart".  Sign up information is provided on this After Visit Summary.  MyChart is used to connect with patients for Virtual Visits (Telemedicine).  Patients are able to view lab/test results, encounter notes, upcoming appointments, etc.  Non-urgent messages can be sent to your provider as well.   To learn more about what you can do with MyChart, go to [email or web address].    Your next appointment:   3 week(s)  The format for your next appointment:   In Person  Provider:   Buford Dresser, MD       Signed, Erik Dresser, MD PhD 04/17/2020  [place]

## 2020-04-17 NOTE — Patient Instructions (Signed)
Medication Instructions:  Start Imdur 30 mg daily  *If you need a refill on your cardiac medications before your next appointment, please call your pharmacy*   Lab Work: None ordered    Testing/Procedures: None ordered    Follow-Up: At [facility], you and your health needs are our priority.  As part of our continuing mission to provide you with exceptional heart care, we have created designated Provider Care Teams.  These Care Teams include your primary Cardiologist (physician) and Advanced Practice Providers (APPs -  Physician Assistants and Nurse Practitioners) who all work together to provide you with the care you need, when you need it.  We recommend signing up for the patient portal called "MyChart".  Sign up information is provided on this After Visit Summary.  MyChart is used to connect with patients for Virtual Visits (Telemedicine).  Patients are able to view lab/test results, encounter notes, upcoming appointments, etc.  Non-urgent messages can be sent to your provider as well.   To learn more about what you can do with MyChart, go to [email or web address].    Your next appointment:   3 week(s)  The format for your next appointment:   In Person  Provider:   Buford Dresser, MD

## 2020-04-18 DIAGNOSIS — M25552 Pain in left hip: Secondary | ICD-10-CM | POA: Diagnosis not present

## 2020-04-18 DIAGNOSIS — M25551 Pain in right hip: Secondary | ICD-10-CM | POA: Diagnosis not present

## 2020-04-18 DIAGNOSIS — M25512 Pain in left shoulder: Secondary | ICD-10-CM | POA: Diagnosis not present

## 2020-04-18 DIAGNOSIS — S335XXD Sprain of ligaments of lumbar spine, subsequent encounter: Secondary | ICD-10-CM | POA: Diagnosis not present

## 2020-04-18 DIAGNOSIS — S139XXD Sprain of joints and ligaments of unspecified parts of neck, subsequent encounter: Secondary | ICD-10-CM | POA: Diagnosis not present

## 2020-05-07 ENCOUNTER — Other Ambulatory Visit: Payer: Self-pay

## 2020-05-07 ENCOUNTER — Encounter: Payer: Self-pay | Admitting: Cardiology

## 2020-05-07 ENCOUNTER — Ambulatory Visit (INDEPENDENT_AMBULATORY_CARE_PROVIDER_SITE_OTHER): Payer: Medicare Other | Admitting: Cardiology

## 2020-05-07 VITALS — BP 140/80 | HR 75 | Ht 70.0 in | Wt 241.8 lb

## 2020-05-07 DIAGNOSIS — I421 Obstructive hypertrophic cardiomyopathy: Secondary | ICD-10-CM

## 2020-05-07 DIAGNOSIS — E7800 Pure hypercholesterolemia, unspecified: Secondary | ICD-10-CM

## 2020-05-07 DIAGNOSIS — I10 Essential (primary) hypertension: Secondary | ICD-10-CM | POA: Diagnosis not present

## 2020-05-07 DIAGNOSIS — I2510 Atherosclerotic heart disease of native coronary artery without angina pectoris: Secondary | ICD-10-CM | POA: Diagnosis not present

## 2020-05-07 DIAGNOSIS — Z7189 Other specified counseling: Secondary | ICD-10-CM | POA: Diagnosis not present

## 2020-05-07 DIAGNOSIS — R072 Precordial pain: Secondary | ICD-10-CM

## 2020-05-07 MED ORDER — ISOSORBIDE MONONITRATE ER 30 MG PO TB24: 30.0000 mg | ORAL_TABLET | Freq: Every day | ORAL | 3 refills | Status: DC

## 2020-05-07 NOTE — Progress Notes (Signed)
Cardiology Office Note:    Date:  05/07/2020   ID:  Francoise Ceo, DOB Oct 16, 1950, MRN [id number]  PCP:  Rita Ohara, MD  Cardiologist:  Buford Dresser, MD PhD  Referring MD: Rita Ohara, MD   CC: follow up  History of Present Illness:    Erik Leon is a 69 y.o. male with a hx of hypertension and mild HOCM who is seen for follow up. He was initially seen as a new consult at the request of Dr. Tomi Bamberger for evaluation of chest pain 02/01/18.  Cardiac history: Chest pain 2019: intermittent. No aggravating factors. Last 10 seconds. If he exerts himself, feels that his pulse goes faster, but doesn't get discomfort. Started several years ago. Happens once every couple months. Last occurrence two weeks ago, occurred while sitting at his desk. Feels like when "you swallow something and it goes down the wrong pipe." Feels like it is under his heart. No radiation, no associated symptoms. He reports multiple prior stress tests, though I do not have the records. Noted as stress in Epic in 2013, but only normal echo results attached.  FH: no known CAD. Brother with congenital cardiac defect (below), may have passed from SCD.  HOCM: echo in 2013 pdf reports SAM, though I cannot see additional details. Noted as mild LVH, I do not have measurements or gradients. Echo here in 2017 noted septal thickness as 1.5 cm and posterior wall thickness as 1.37 cm. No gradient noted.   Today: Was taking imdur daily, but was giving him a headache. Stopped 11/12 as he had to have a clear head over the weekend, noted the recurrence of his chest symptoms on 11/13.   Avoided certain foods as well, but noted less symptoms of feeling like a bubble in his chest.  Blood pressures have been stable. Wants to try to get back on imdur and see how he does. Otherwise we discussed trying amlodipine and cutting back on lisinopril.   Denies shortness of breath at rest or with normal exertion. No PND, orthopnea, LE edema or  unexpected weight gain. No syncope.   Past Medical History:  Diagnosis Date   Allergy    Atypical mole 11/23/2000   slight to moderate- Right upper back (WS)   Atypical mole 11/23/2000   slight to moderate-right uppermost inner thigh (WS)   Atypical mole 10/24/2009   severe-right mid abdomen (EXC)   Atypical mole 01/06/2019   left mi back    Basal cell carcinoma 09/14/2012   superficial-left upper shoulder (txpbx)   Basal cell carcinoma 11/26/2017   sup + nod-right chest (CX35FU)   Basal cell carcinoma 01/06/2019   sup-right post shoulder (CX35FU)   BCC (basal cell carcinoma of skin) 10/24/2009   top right shoulder (CX35FU)   Calcaneal fracture 2014   right; s/p boot; reinjured in 2019   Colon polyp    Diverticulosis 6/09   rare L colon diverticuli   Elevated PSA 10/2012   benign prostate biopsies 11/2012   Esophageal stricture    s/p dilatation (Dr. Earlean Shawl)   GERD (gastroesophageal reflux disease)    Heart murmur    Hypertension    Hypertr obst cardiomyop    mild--Dr. Radford Pax (previously)   Internal hemorrhoid 6/09   Migraine age 28   resolved after nasal surgery   Nodular basal cell carcinoma ([facility]) 11/18/2019   [place] Nose    Past Surgical History:  Procedure Laterality Date   CHOLECYSTECTOMY  5/03   COLONOSCOPY  11/2007,  2011, 2014   Dr. Rudi Rummage again 2019   dental implant     ELBOW SURGERY Right 06/2013   bone spur; Dr. Percell Miller   ESOPHAGOGASTRODUODENOSCOPY  2012   with dilatation.  Dr. Earlean Shawl   LIPOMA EXCISION     Dr. Stanford Breed from L paramedian prevertebral space   NASAL POLYP SURGERY  2008   SHOULDER ARTHROSCOPY  12/03; 1/06   right; left    Current Medications: Current Outpatient Medications on File Prior to Visit  Medication Sig   aspirin 81 MG tablet Take 81 mg by mouth daily.     atorvastatin (LIPITOR) 40 MG tablet TAKE 1 TABLET(40 MG) BY MOUTH DAILY AT 6 PM   cholecalciferol (VITAMIN D) 1000 units tablet  Take 1,000 Units by mouth daily.   fexofenadine (ALLEGRA) 180 MG tablet Take 180 mg by mouth daily.   lisinopril (ZESTRIL) 40 MG tablet Take 1 tablet (40 mg total) by mouth daily.   omeprazole (PRILOSEC) 20 MG capsule Take 40 mg by mouth 2 (two) times daily before a meal.   No current facility-administered medications on file prior to visit.     Allergies:   Coconut fatty acids   Social History   Tobacco Use   Smoking status: Former Smoker    Quit date: 06/24/1983    Years since quitting: 36.8   Smokeless tobacco: Never Used  Vaping Use   Vaping Use: Never used  Substance Use Topics   Alcohol use: Yes    Comment: 1-2 drink/month (scotch) or less   Drug use: No    Family History: The patient's family history includes Alcohol abuse in his father; Cancer in his mother; Dementia in his mother; Hypertension in his mother. There is no history of Diabetes, Prostate cancer, or Colon cancer. Half-brother had a birth defect and died after playing handball at age 51. Not HCM as far as he knows. There were 11 sons, no other with heart problems. Not sure about his dad's history, he was an alcoholic. No MI or stroke.  ROS:   Please see the history of present illness.  Additional pertinent ROS otherwise unremarkable.  EKGs/Labs/Other Studies Reviewed:    The following studies were reviewed today: Echo 2013: (pdf scan)--noted as stress, but only resting echo in note EF >70%, mild cLVH. +SAM, trace MR  Echo 2017:  Study Conclusions - Left ventricle: The cavity size was normal. Wall thickness was   increased in a pattern of mild LVH. Systolic function was normal.   The estimated ejection fraction was in the range of 60% to 65%.   Wall motion was normal; there were no regional wall motion   abnormalities. Doppler parameters are consistent with abnormal   left ventricular relaxation (grade 1 diastolic dysfunction). - Aortic valve: There was no stenosis. - Mitral valve: There was  trivial regurgitation. - Right ventricle: The cavity size was normal. Systolic function   was normal. - Pulmonary arteries: No complete TR doppler jet so unable to   estimate PA systolic pressure. - Inferior vena cava: The vessel was normal in size. The   respirophasic diameter changes were in the normal range (>= 50%),   consistent with normal central venous pressure.  Impressions:  - Normal LV size with mild LV hypertrophy. EF 60-65%. Normal RV   size and systolic function. No significant valvular   abnormalities.  CT cardiac 03/03/2018 FINDINGS: The pulmonary veins drained normally to the left atrium. Calcium Score: 198 Agatston units Coronary Arteries: Right dominant with no  anomalies LM: Short, no plaque or stenosis. LAD system: Relatively small caliber vessel. Mixed plaque proximal and mid LAD, appears to be mild stenosis. Circumflex system: Large LCx. Mixed plaque mid vessel without significant stenosis. RCA system: Calcified plaque distal RCA/PLV with mild stenosis.  IMPRESSION: 1. Coronary artery calcium score 198 Agatston units. This places the patient in the 65th percentile for age and gender, suggesting intermediate risk for future cardiac events. 2. Extensive plaque in the proximal and mid LAD. Appears to be mild stenosis. 3.  Mild stenosis in distal RCA/PLV.  FFR without hemodynamically significant stenosis  EKG:  ECG personally reviewed, ECG from 04/17/20 shows NSR with nonspecific ST changes, similar to 2017  Recent Labs: 06/07/2019: ALT 15; BUN 14; Creatinine, Ser 1.11; Hemoglobin 13.8; Platelets 176; Potassium 4.9; Sodium 142  Recent Lipid Panel    Component Value Date/Time   CHOL 114 06/07/2019 0820   TRIG 107 06/07/2019 0820   HDL 35 (L) 06/07/2019 0820   CHOLHDL 3.3 06/07/2019 0820   CHOLHDL 4.0 02/21/2016 1035   VLDL 22 02/21/2016 1035   LDLCALC 59 06/07/2019 0820    Physical Exam:    VS:  BP 140/80 (BP Location: Right Arm, Patient  Position: Sitting)    Pulse 75    Ht 5\' 10"  (1.778 m)    Wt 241 lb 12.8 oz (109.7 kg)    SpO2 96%    BMI 34.69 kg/m     Wt Readings from Last 3 Encounters:  05/07/20 241 lb 12.8 oz (109.7 kg)  04/17/20 243 lb (110.2 kg)  12/08/19 236 lb (107 kg)   GEN: Well nourished, well developed in no acute distress HEENT: Normal, moist mucous membranes NECK: No JVD CARDIAC: regular rhythm, normal S1 and S2, no rubs or gallops. 2/6 machinelike systolic murmur. VASCULAR: Radial and DP pulses 2+ bilaterally. No carotid bruits RESPIRATORY:  Clear to auscultation without rales, wheezing or rhonchi  ABDOMEN: Soft, non-tender, non-distended MUSCULOSKELETAL:  Ambulates independently SKIN: Warm and dry, no edema NEUROLOGIC:  Alert and oriented x 3. No focal neuro deficits noted. PSYCHIATRIC:  Normal affect   ASSESSMENT:    1. Nonocclusive coronary atherosclerosis of native coronary artery   2. Precordial pain   3. Mild HOCM (hypertrophic obstructive cardiomyopathy) ([place])   4. Essential hypertension   5. Pure hypercholesterolemia   6. Counseling on health promotion and disease prevention    PLAN:    Chest fluttering/palpitation/discomfort: -coronary CT 02/2018 with Ca score 198, prox/mid LAD plaque but no significant stenosis by FFR -we trialed imdur after his last visit. This resolved his symptoms, but it gave him a headache -we discussed options today. He would like to re-trial imdur and use PRN acetaminophen (not to exceed labeled max dose) for the headache -alternative would be to trial amlodipine, would likely have to cut back on lisinopril -counseled on red flag warning signs that need immediate medical attention.  HOCM, mild -Predominantly focal septal hypertrophy. Max LVOT gradient noted on imaging was 12 mmHg. -asymptomatic, no syncope, no SCD in family related to HCM (though more information on half-brother's condition would be helpful). -had echo in 2017, will repeat in the future based  on symptoms vs. Routine monitoring  Hypertension:  -typically elevated in office but well controlled at home -continue lisinopril, imdur -goal <130/80, call if home numbers rise  Hypercholesterolemia: LDL goal <70.  -continue atorvastatin  Nonobstructive CAD: based on plaque/calcium on scan -unclear what is triggering his chest discomfort. Question microvascular angina vs. GI vs. Other.  His symptoms resolved with imdur, suggesting smooth muscle involvement. -continue aspirin, atorvastatin  Secondary prevention -recommend heart healthy/Mediterranean diet, with whole grains, fruits, vegetable, fish, lean meats, nuts, and olive oil. Limit salt. -recommend moderate walking, 3-5 times/week for 30-50 minutes each session. Aim for at least 150 minutes.week. Goal should be pace of 3 miles/hours, or walking 1.5 miles in 30 minutes -recommend avoidance of tobacco products. Avoid excess alcohol.  Plan for follow up: 3 mos or sooner PRN. He will contact me if he cannot tolerate imdur  Medication Adjustments/Labs and Tests Ordered: Current medicines are reviewed at length with the patient today.  Concerns regarding medicines are outlined above.  No orders of the defined types were placed in this encounter.  Meds ordered this encounter  Medications   isosorbide mononitrate (IMDUR) 30 MG 24 hr tablet    Sig: Take 1 tablet (30 mg total) by mouth daily.    Dispense:  90 tablet    Refill:  3    Patient Instructions  Medication Instructions:  Restart Imdur 30 mg daily  *If you need a refill on your cardiac medications before your next appointment, please call your pharmacy*   Lab Work: None   Testing/Procedures: None   Follow-Up: At [facility], you and your health needs are our priority.  As part of our continuing mission to provide you with exceptional heart care, we have created designated Provider Care Teams.  These Care Teams include your primary Cardiologist (physician) and  Advanced Practice Providers (APPs -  Physician Assistants and Nurse Practitioners) who all work together to provide you with the care you need, when you need it.  We recommend signing up for the patient portal called "MyChart".  Sign up information is provided on this After Visit Summary.  MyChart is used to connect with patients for Virtual Visits (Telemedicine).  Patients are able to view lab/test results, encounter notes, upcoming appointments, etc.  Non-urgent messages can be sent to your provider as well.   To learn more about what you can do with MyChart, go to [email or web address].    Your next appointment:   3 month(s)  The format for your next appointment:   In Person  Provider:   Buford Dresser, MD        Signed, Buford Dresser, MD PhD 05/07/2020 5:25 PM    [place]

## 2020-05-07 NOTE — Patient Instructions (Signed)
Medication Instructions:  Restart Imdur 30 mg daily  *If you need a refill on your cardiac medications before your next appointment, please call your pharmacy*   Lab Work: None   Testing/Procedures: None   Follow-Up: At [facility], you and your health needs are our priority.  As part of our continuing mission to provide you with exceptional heart care, we have created designated Provider Care Teams.  These Care Teams include your primary Cardiologist (physician) and Advanced Practice Providers (APPs -  Physician Assistants and Nurse Practitioners) who all work together to provide you with the care you need, when you need it.  We recommend signing up for the patient portal called "MyChart".  Sign up information is provided on this After Visit Summary.  MyChart is used to connect with patients for Virtual Visits (Telemedicine).  Patients are able to view lab/test results, encounter notes, upcoming appointments, etc.  Non-urgent messages can be sent to your provider as well.   To learn more about what you can do with MyChart, go to [email or web address].    Your next appointment:   3 month(s)  The format for your next appointment:   In Person  Provider:   Buford Dresser, MD

## 2020-06-10 ENCOUNTER — Encounter: Payer: Self-pay | Admitting: Family Medicine

## 2020-06-11 ENCOUNTER — Other Ambulatory Visit: Payer: Self-pay

## 2020-06-11 ENCOUNTER — Other Ambulatory Visit (INDEPENDENT_AMBULATORY_CARE_PROVIDER_SITE_OTHER): Payer: Medicare Other

## 2020-06-11 ENCOUNTER — Telehealth (INDEPENDENT_AMBULATORY_CARE_PROVIDER_SITE_OTHER): Payer: Medicare Other | Admitting: Family Medicine

## 2020-06-11 ENCOUNTER — Encounter: Payer: Self-pay | Admitting: Family Medicine

## 2020-06-11 VITALS — BP 129/66 | HR 79 | Temp 98.4°F | Ht 70.0 in | Wt 238.0 lb

## 2020-06-11 DIAGNOSIS — J101 Influenza due to other identified influenza virus with other respiratory manifestations: Secondary | ICD-10-CM | POA: Diagnosis not present

## 2020-06-11 DIAGNOSIS — R6889 Other general symptoms and signs: Secondary | ICD-10-CM | POA: Diagnosis not present

## 2020-06-11 DIAGNOSIS — R509 Fever, unspecified: Secondary | ICD-10-CM

## 2020-06-11 DIAGNOSIS — R059 Cough, unspecified: Secondary | ICD-10-CM | POA: Diagnosis not present

## 2020-06-11 LAB — POCT INFLUENZA A/B
Influenza A, POC: POSITIVE — AB
Influenza B, POC: NEGATIVE

## 2020-06-11 LAB — POC COVID19 BINAXNOW: SARS Coronavirus 2 Ag: NEGATIVE

## 2020-06-11 MED ORDER — OSELTAMIVIR PHOSPHATE 75 MG PO CAPS: 75.0000 mg | ORAL_CAPSULE | Freq: Two times a day (BID) | ORAL | 0 refills | Status: DC

## 2020-06-11 NOTE — Patient Instructions (Addendum)
Stay well hydrated (aim for very clear urine). Avoid any medications that have phenylephrine (decongestant or "sinus"). Guafenesin (found in plain Mucinex) is an expectorant that will help break up any thick mucus. If the cough worsens, you can use Delsym syrup or the DM version of Mucinex (but do not take this along with Coricidin unless you verify there are no overlapping ingredients) Also be sure not duplicate acetaminophen/tylenol in over the counter combination medications.   Let us know if you change your mind about benzonatate (cough medication prescription). If you don't sleep well tonight, consider a hydrocodone-containing cough syrup (that you ONLY use at night, due to the sedating side effects).  If you have persistent or worsening fever, pain with breathing or shortness of breath, you will need re-evaluation.  You tested positive for Influenza A. The send-out COVID test (PCR) will be back in 1-2 days (and should be negative, given that we found another source for your symptoms).  Take the Tamiflu as directed.   Influenza, Adult Influenza, more commonly known as "the flu," is a viral infection that mainly affects the respiratory tract. The respiratory tract includes organs that help you breathe, such as the lungs, nose, and throat. The flu causes many symptoms similar to the common cold along with high fever and body aches. The flu spreads easily from person to person (is contagious). Getting a flu shot (influenza vaccination) every year is the best way to prevent the flu. What are the causes? This condition is caused by the influenza virus. You can get the virus by:  Breathing in droplets that are in the air from an infected person's cough or sneeze.  Touching something that has been exposed to the virus (has been contaminated) and then touching your mouth, nose, or eyes. What increases the risk? The following factors may make you more likely to get the flu:  Not washing  or sanitizing your hands often.  Having close contact with many people during cold and flu season.  Touching your mouth, eyes, or nose without first washing or sanitizing your hands.  Not getting a yearly (annual) flu shot. You may have a higher risk for the flu, including serious problems such as a lung infection (pneumonia), if you:  Are older than 65.  Are pregnant.  Have a weakened disease-fighting system (immune system). You may have a weakened immune system if you: ? Have HIV or AIDS. ? Are undergoing chemotherapy. ? Are taking medicines that reduce (suppress) the activity of your immune system.  Have a long-term (chronic) illness, such as heart disease, kidney disease, diabetes, or lung disease.  Have a liver disorder.  Are severely overweight (morbidly obese).  Have anemia. This is a condition that affects your red blood cells.  Have asthma. What are the signs or symptoms? Symptoms of this condition usually begin suddenly and last 4-14 days. They may include:  Fever and chills.  Headaches, body aches, or muscle aches.  Sore throat.  Cough.  Runny or stuffy (congested) nose.  Chest discomfort.  Poor appetite.  Weakness or fatigue.  Dizziness.  Nausea or vomiting. How is this diagnosed? This condition may be diagnosed based on:  Your symptoms and medical history.  A physical exam.  Swabbing your nose or throat and testing the fluid for the influenza virus. How is this treated? If the flu is diagnosed early, you can be treated with medicine that can help reduce how severe the illness is and how long it lasts (antiviral medicine). This  may be given by mouth (orally) or through an IV. Taking care of yourself at home can help relieve symptoms. Your health care provider may recommend:  Taking over-the-counter medicines.  Drinking plenty of fluids. In many cases, the flu goes away on its own. If you have severe symptoms or complications, you may be  treated in a hospital. Follow these instructions at home: Activity  Rest as needed and get plenty of sleep.  Stay home from work or school as told by your health care provider. Unless you are visiting your health care provider, avoid leaving home until your fever has been gone for 24 hours without taking medicine. Eating and drinking  Take an oral rehydration solution (ORS). This is a drink that is sold at pharmacies and retail stores.  Drink enough fluid to keep your urine pale yellow.  Drink clear fluids in small amounts as you are able. Clear fluids include water, ice chips, diluted fruit juice, and low-calorie sports drinks.  Eat bland, easy-to-digest foods in small amounts as you are able. These foods include bananas, applesauce, rice, lean meats, toast, and crackers.  Avoid drinking fluids that contain a lot of sugar or caffeine, such as energy drinks, regular sports drinks, and soda.  Avoid alcohol.  Avoid spicy or fatty foods. General instructions      Take over-the-counter and prescription medicines only as told by your health care provider.  Use a cool mist humidifier to add humidity to the air in your home. This can make it easier to breathe.  Cover your mouth and nose when you cough or sneeze.  Wash your hands with soap and water often, especially after you cough or sneeze. If soap and water are not available, use alcohol-based hand sanitizer.  Keep all follow-up visits as told by your health care provider. This is important. How is this prevented?   Get an annual flu shot. You may get the flu shot in late summer, fall, or winter. Ask your health care provider when you should get your flu shot.  Avoid contact with people who are sick during cold and flu season. This is generally fall and winter. Contact a health care provider if:  You develop new symptoms.  You have: ? Chest pain. ? Diarrhea. ? A fever.  Your cough gets worse.  You produce more  mucus.  You feel nauseous or you vomit. Get help right away if:  You develop shortness of breath or difficulty breathing.  Your skin or nails turn a bluish color.  You have severe pain or stiffness in your neck.  You develop a sudden headache or sudden pain in your face or ear.  You cannot eat or drink without vomiting. Summary  Influenza, more commonly known as "the flu," is a viral infection that primarily affects your respiratory tract.  Symptoms of the flu usually begin suddenly and last 4-14 days.  Getting an annual flu shot is the best way to prevent getting the flu.  Stay home from work or school as told by your health care provider. Unless you are visiting your health care provider, avoid leaving home until your fever has been gone for 24 hours without taking medicine.  Keep all follow-up visits as told by your health care provider. This is important. This information is not intended to replace advice given to you by your health care provider. Make sure you discuss any questions you have with your health care provider. Document Revised: 09/09/2018 Document Reviewed: 11/25/2017 Elsevier Patient Education  2020 [employer]  

## 2020-06-11 NOTE — Progress Notes (Signed)
Start time: 10:45 End time: 11:07  Unable to connect via video (error message on phone), had to change to telephone visit.  Virtual Visit via Telephone Note  I connected with Francoise Ceo on 06/11/20 by telephone and verified that I am speaking with the correct person using two identifiers.  Location: Patient: home Provider: office   I discussed the limitations of evaluation and management by telemedicine and the availability of in person appointments. The patient expressed understanding and agreed to proceed.  History of Present Illness:  Chief Complaint  Patient presents with  . Cough    VIRTUAL cough is a little than it has been the past few days. Has had fever, fever broke 5:30 this am-he was drenched. Did have 99.5 this am but took tylenol and ibuprofen. Took 2 at home covid tests, both negative-one Fri and one this morning.    Friday he developed fever, cough, runny nose, achiness, headache. Tmax 102.5 Saturday night.  He took some Coricidin HBP, mucinex--he thought it made the cough worse when he took it.  Pulse and blood pressure went up.   Cough today is dry. Chest is sore from coughing over the weekend. Coughs some with talking now. He has slight sore throat from nasal drainage and coughing. Nasal drainage is slightly colored (yellowish); denies sinus pain. Had some chest tightness yesterday, when his blood pressure was high from the medication.  Denies chest pain or shortness of breath today.  He took a COVID test Friday night and this morning, both negative. No known exposures.  He has been around 3 employees with fever and flu-like symptoms.  Their COVID tests were negative and are back to work.  Exposed to them on Thursday. Also exposure to flu and strep throat.  He is drinking a lot of water--urine started out very dark, and has been getting clearer, slightly yellow this morning. He feels weak, very lethargic, didn't sleep well due to coughing for the last 3  nights.  He denies dizziness  PMH, PSH, SH reviewed  Current Outpatient Medications on File Prior to Visit  Medication Sig Dispense Refill  . acetaminophen (TYLENOL) 325 MG tablet Take 325 mg by mouth every 6 (six) hours as needed.    Marland Kitchen aspirin 81 MG tablet Take 81 mg by mouth daily.    Marland Kitchen atorvastatin (LIPITOR) 40 MG tablet TAKE 1 TABLET(40 MG) BY MOUTH DAILY AT 6 PM 90 tablet 3  . cholecalciferol (VITAMIN D) 1000 units tablet Take 1,000 Units by mouth daily.    Marland Kitchen DM-APAP-CPM (CORICIDIN HBP PO) Take by mouth.    . fexofenadine (ALLEGRA) 180 MG tablet Take 180 mg by mouth daily.    Marland Kitchen ibuprofen (ADVIL) 200 MG tablet Take 400 mg by mouth every 6 (six) hours as needed.    . isosorbide mononitrate (IMDUR) 30 MG 24 hr tablet Take 1 tablet (30 mg total) by mouth daily. 90 tablet 3  . lisinopril (ZESTRIL) 40 MG tablet Take 1 tablet (40 mg total) by mouth daily. 90 tablet 3  . omeprazole (PRILOSEC) 20 MG capsule Take 40 mg by mouth 2 (two) times daily before a meal.     No current facility-administered medications on file prior to visit.   Allergies  Allergen Reactions  . Coconut Fatty Acids Swelling    ROS:  +fever and URI symptoms per HPI. No nausea, vomiting, diarrhea, rashes, no headaches, dizziness, currently without chest pain, shortness of breath. No urinary complaints. See HPI.    Observations/Objective:  BP 129/66 Comment: 210 130-last night  Pulse 79 Comment: 120 last night  Temp 98.4 F (36.9 C) (Oral)   Ht 5\' 10"  (1.778 m)   Wt 238 lb (108 kg)   BMI 34.15 kg/m   BP 116/60 at 10:20 am Pleasant male, sounds tired.  No coughing during visit, speaking easily. Exam is limited due to virtual nature of the visit.  Influenza A+, B-, COVID negative   Assessment and Plan:  Influenza A - Plan: oseltamivir (TAMIFLU) 75 MG capsule  Flu-like symptoms  Fever, unspecified fever cause   Declines Benzonatate.  Stay well hydrated (aim for very clear urine). Avoid any  medications that have phenylephrine (decongestant or "sinus"). Guafenesin (found in plain Mucinex) is an expectorant that will help break up any thick mucus. If the cough worsens, you can use Delsym syrup or the DM version of Mucinex (but do not take this along with Coricidin unless you verify there are no overlapping ingredients) Also be sure not duplicate acetaminophen/tylenol in over the counter combination medications.   Let us know if you change your mind about benzonatate (cough medication prescription). If you don't sleep well tonight, consider a hydrocodone-containing cough syrup (that you ONLY use at night, due to the sedating side effects).  If you have persistent or worsening fever, pain with breathing or shortness of breath, you will need re-evaluation.   Follow Up Instructions:    I discussed the assessment and treatment plan with the patient. The patient was provided an opportunity to ask questions and all were answered. The patient agreed with the plan and demonstrated an understanding of the instructions.   The patient was advised to call back or seek an in-person evaluation if the symptoms worsen or if the condition fails to improve as anticipated.  I spent 25 minutes dedicated to the care of this patient, including pre-visit review of records, face to face time, post-visit ordering of testing and documentation.    Vikki Ports, MD

## 2020-06-12 LAB — NOVEL CORONAVIRUS, NAA: SARS-CoV-2, NAA: NOT DETECTED

## 2020-06-12 LAB — SARS-COV-2, NAA 2 DAY TAT

## 2020-06-13 ENCOUNTER — Other Ambulatory Visit: Payer: Self-pay

## 2020-06-17 ENCOUNTER — Encounter: Payer: Self-pay | Admitting: Cardiology

## 2020-06-21 ENCOUNTER — Ambulatory Visit: Payer: Medicare Other | Admitting: Family Medicine

## 2020-07-14 ENCOUNTER — Encounter: Payer: Self-pay | Admitting: Family Medicine

## 2020-07-14 DIAGNOSIS — I2510 Atherosclerotic heart disease of native coronary artery without angina pectoris: Secondary | ICD-10-CM

## 2020-07-25 DIAGNOSIS — M25552 Pain in left hip: Secondary | ICD-10-CM | POA: Diagnosis not present

## 2020-07-25 DIAGNOSIS — M25551 Pain in right hip: Secondary | ICD-10-CM | POA: Diagnosis not present

## 2020-08-01 NOTE — Patient Instructions (Addendum)
  HEALTH MAINTENANCE RECOMMENDATIONS:  It is recommended that you get at least 30 minutes of aerobic exercise at least 5 days/week (for weight loss, you may need as much as 60-90 minutes). This can be any activity that gets your heart rate up. This can be divided in 10-15 minute intervals if needed, but try and build up your endurance at least once a week.  Weight bearing exercise is also recommended twice weekly.  Eat a healthy diet with lots of vegetables, fruits and fiber.  "Colorful" foods have a lot of vitamins (ie green vegetables, tomatoes, red peppers, etc).  Limit sweet tea, regular sodas and alcoholic beverages, all of which has a lot of calories and sugar.  Up to 2 alcoholic drinks daily may be beneficial for men (unless trying to lose weight, watch sugars).  Drink a lot of water.  Sunscreen of at least SPF 30 should be used on all sun-exposed parts of the skin when outside between the hours of 10 am and 4 pm (not just when at beach or pool, but even with exercise, golf, tennis, and yard work!)  Use a sunscreen that says "broad spectrum" so it covers both UVA and UVB rays, and make sure to reapply every 1-2 hours.  Remember to change the batteries in your smoke detectors when changing your clock times in the spring and fall.  Carbon monoxide detectors are recommended for your home.  Use your seat belt every time you are in a car, and please drive safely and not be distracted with cell phones and texting while driving.    Mr. Roots , Thank you for taking time to come for your Medicare Wellness Visit. I appreciate your ongoing commitment to your health goals. Please review the following plan we discussed and let me know if I can assist you in the future.    This is a list of the screening recommended for you and due dates:  Health Maintenance  Topic Date Due  . Flu Shot  01/22/2020  . COVID-19 Vaccine (2 - Booster for [employer] series) 04/16/2020  . Colon Cancer Screening  07/02/2020  .  Tetanus Vaccine  10/15/2020  .  Hepatitis C: One time screening is recommended by Center for Disease Control  (CDC) for  adults born from [birth year] through 1966.   Completed  . Pneumonia vaccines  Completed   Yearly high dose flu shots are recommended.  Booster is recommended for people who have only had 1 dose of J&J vaccine.  You are due now for colonoscopy with Dr. Earlean Shawl.  Please get Tetanus booster (TdaP) from the pharmacy.  You need to wait at least 2 weeks apart from other vaccines.  I recommend getting the new shingles vaccine (Shingrix). Since you have Medicare, you will need to get this from the pharmacy, as it is covered by Part D. This is a series of 2 injections, spaced 2 months apart.  Consider trying to decrease the Nexium dose as diet improves. Goal is to take the lowest effective dose (to limit issues with nutrient absorption, yet control your reflux symptoms).

## 2020-08-01 NOTE — Progress Notes (Signed)
Chief Complaint  Patient presents with  . Medicare Wellness    Fasting AWV. Has colonoscopy scheduled for May with Dr. Earlean Shawl. Prefers to do prostate/rectal exam with [facility]-he is going to schedule. Will get Tdap and Shingrix at pharmacy. Does not want flu/covid booster. Would like to have blood type done.    Erik Leon is a 70 y.o. male who presents for annual wellness visit and follow-up on chronic medical conditions.   He has the following concerns:  He had Influenza A in December. He was sick for most of the month of January. He is better now, but doesn't recall every being that sick.  He saw Dr. Edmonia Lynch in October with neck/shoulder pain and hip pain.  Neck pain was felt to be radicular. He had L hip injection, recommended Voltaren gel, and hip pain is much better, continuing to improve. Neck and shoulder pain is also better. He used CBD, and is now using Voltaren gel. Thinks related to swimming (had been in [facility] for a month in October, where he swims daily).  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 He started at 240.2# on 6/14.  At that time he was doing keto program, but interested in [place]. He stated that he eats very little, but admits to eating cake/ice cream.   Currently he reports he cut back on sweets. He tried Keto, got busy, sick, gave up.  Lost interest in [place]. Taste isn't back yet.  Hasn't had any steak in 9 months, can't taste it. Still can taste sugar.  His new plan--he will start working with a trainer next week.  He will work with Shirlean Mylar three times a week, and he reports that she is "big on nutrition", and will be helping him in that regard as well.  He hasn't been eating as well--he didn't go to store when sick, was brought more food, diet wasn't as good (but  "not bad").  Thinks too much salt, lots of soups. BP's stayed good.  His leg cramps resolved once he increased water intake (64 oz/d) and cut back from 10-15 cups of coffee/d to just 3. This was reported at last visit. He states today that his caffeine intake is back up some, but still drinking a lot of water, and denies leg cramps.  Hypertension:Heis compliant in taking lisinopril 40mg  daily. He reports that his blood pressure is running 110/65-70 at home.  Denies headaches, side effects of medications (slight cough/throat clearing from ACEI, tolerable and unchanged x years, not bothersome). Deniesdizziness, chest pain,edema.  h/o hypertrophic obstructive cardiomyopathy (mild).Hehad repeatechocardiogram in 02/2016 which showed normal LV size with mild LV hypertrophy. EF 60-65%. Normal RV size and systolic function. No significant valvular abnormalities.  He was noted to have elevated Calcium score in 02/2018, and was started on atorvastatin 40mg . He denies side effects, and reports compliance. Lipids were at goal on last check, due for recheck. Lab Results  Component Value Date   CHOL 114 06/07/2019   HDL 35 (L) 06/07/2019   LDLCALC 59 06/07/2019   TRIG 107 06/07/2019   CHOLHDL 3.3 06/07/2019    He is under the care of Dr. Harrell Gave, last seen in November, and has f/u scheduled next week. They had discussed re-trying imdur (and using tylenol for the headaches) vs trial of amlodipine (and cutting  back on lisinopril) to help with his symptoms of chest fluttering/palpitations/discomfort. He has nonobstructive CAD based on Calcium scan.  He noted less fluttering while on imdur.  Couldn't tolerate due to headache. Stopped it in mid-December, and hasn't had any recurrence of fluttering.  Denies any pain/pressure.  Vitamin D deficiency--Level was low at 16.8 in 06/2017. He was put on 12 weeks of prescription vitamin D.Follow-up level was normal at 35.9 in 12/2017. Last level was normal  at 37.6 in 05/2019. He continues to take 1000 IU daily.  Impaired fasting glucose--sugar was 113 in 05/2019. Lab Results  Component Value Date   HGBA1C 5.7 (A) 12/08/2019    H/o elevated PSA.He previously saw urologist,had benign biopsies in the past. He was reminded at his wellness visit last year that he was past due; he declined exam here, and reported he would schedule a visit at [facility]. PSA was 4.1 in 05/2019.  He has not yet called to schedule, but plans to. He will not undergo another biopsy. He wants PSA checked today, to forward to urologist.  Explained that his prostate hasn't been examined in many years, and needs to be (declines today, will go to urologist).  He reports that high PSA "runs in the family".  We discussed causes for elevated PSA (BPH, cancer, related to size of gland, etc).  GERD: He is taking2 of the OTC Nexium BID regularly.   He occasionally will feel like food gets stuck. Not eating steak, so not as noticeable.  Previously had dilatation by Dr. Earlean Shawl in the past.  Immunization History  Administered Date(s) Administered  . Fluad Quad(high Dose 65+) 06/09/2019  . Influenza, High Dose Seasonal PF 07/13/2017  . Janssen (J&J) SARS-COV-2 Vaccination 02/20/2020  . Pneumococcal Conjugate-13 02/21/2016  . Pneumococcal Polysaccharide-23 07/13/2017  . Tdap 10/16/2010  . Zoster 10/29/2011   Last colonoscopy:06/2017, adenomatous colon polyps and internal hemorrhoids. F/u rec 3 years, due now. Scheduled for May. Last PSA:  Lab Results  Component Value Date   PSA1 4.1 (H) 06/07/2019   PSA 6.59 (H) 07/19/2015   PSA 4.29 (H) 11/01/2012   PSA 4.63 (H) 11/01/2012   Dentist: twice yearly Ophtho:yearly  Exercise:Starting with a trainer 3x/week next week. Not currently getting much exercise. Swam 2x/day when in [place] (for a month in October).  Other doctors caring for patient: Ophtho: Mauri Reading Dentist: Dr. Pablo Lawrence GI: Dr. Earlean Shawl Derm: Dr.  Denna Haggard Podiatry: Dr. Amalia Hailey Cardiology: Dr. Harrell Gave ENT: Dr. Lytle Michaels: [facility]: Dr. Louis Meckel  DepressionScreen: negative Fall Screen: negative Functional status screen:unremarkable  Mini-Cog normal.  See epicfor full screens  He has a living will and healthcare power of attorney. In the process of getting this taken care, paperwork is with attorneys "it is complicated".   PMH, PSH, SH and FH were updated and reviewed  Outpatient Encounter Medications as of 08/02/2020  Medication Sig Note  . aspirin 81 MG tablet Take 81 mg by mouth daily.   Marland Kitchen atorvastatin (LIPITOR) 40 MG tablet TAKE 1 TABLET(40 MG) BY MOUTH DAILY AT 6 PM   . cholecalciferol (VITAMIN D) 1000 units tablet Take 1,000 Units by mouth daily.   Marland Kitchen esomeprazole (NEXIUM) 40 MG capsule Take 40 mg by mouth 2 (two) times daily before a meal.   . fexofenadine (ALLEGRA) 180 MG tablet Take 180 mg by mouth daily.   Marland Kitchen lisinopril (ZESTRIL) 40 MG tablet Take 1 tablet (40 mg total) by mouth daily.   Marland Kitchen acetaminophen (TYLENOL) 325 MG tablet Take 325 mg  by mouth every 6 (six) hours as needed. (Patient not taking: Reported on 08/02/2020)   . ibuprofen (ADVIL) 200 MG tablet Take 400 mg by mouth every 6 (six) hours as needed. (Patient not taking: Reported on 08/02/2020)   . isosorbide mononitrate (IMDUR) 30 MG 24 hr tablet Take 1 tablet (30 mg total) by mouth daily. (Patient not taking: Reported on 08/02/2020) 08/02/2020: Discontinued due to headaches (took for about a month)  . [DISCONTINUED] DM-APAP-CPM (CORICIDIN HBP PO) Take by mouth. 06/11/2020: Last dose was yesterday.  . [DISCONTINUED] omeprazole (PRILOSEC) 20 MG capsule Take 40 mg by mouth 2 (two) times daily before a meal.   . [DISCONTINUED] oseltamivir (TAMIFLU) 75 MG capsule Take 1 capsule (75 mg total) by mouth 2 (two) times daily.    No facility-administered encounter medications on file as of 08/02/2020.   Allergies  Allergen Reactions  . Coconut Fatty  Acids Swelling    ROS: The patient denies anorexia, fever, headaches, vision loss, decreased hearing, ear pain, hoarseness, palpitations, dizziness, syncope, dyspnea on exertion, swelling, nausea, vomiting, diarrhea, constipation, abdominal pain, melena, hematochezia, hematuria, incontinence, erectile dysfunction, nocturia, weakened urine stream, dysuria, genital lesions, numbness, tingling, weakness, tremor, suspicious skin lesions, depression, anxiety, abnormal bleeding/bruising, or enlarged lymph nodes.  Sees derm (Dr. Denna Haggard) regularly, no skin concerns. GERD is well controlled with BID PPI. Dysphagia less often since not eating steak. Fluttering and chest discomfort resolved. Persistent abnormal taste (he suspects he had COVID in past) See HPI    PHYSICAL EXAM:  BP 136/76   Pulse 72   Ht 5' 9.5" (1.765 m)   Wt 240 lb (108.9 kg)   BMI 34.93 kg/m   Wt Readings from Last 3 Encounters:  08/02/20 240 lb (108.9 kg)  06/11/20 238 lb (108 kg)  05/07/20 241 lb 12.8 oz (109.7 kg)     General Appearance:  Alert, cooperative, no distress, appears stated age   Head:  Normocephalic, without obvious abnormality, atraumatic   Eyes:  PERRL, conjunctiva/corneas clear, EOM's intact, fundi benign   Ears:  Normal TM's and external ear canals   Nose:  Not examined, wearing mask due to COVID-19 pandemic  Throat:  Not examined, wearing mask due to COVID-19 pandemic  Neck:  Supple, no lymphadenopathy; thyroid: no enlargement/ tenderness/nodules; no carotid bruit or JVD.  Back:  Spine nontender, no curvature, ROM normal, no CVA tenderness   Lungs:  Clear to auscultation bilaterally without wheezes, rales or ronchi; respirations unlabored   Chest Wall:  No tenderness or deformity   Heart:  Regular rate and rhythm, S1 and S2 normal,norub or gallop.3/6 SEM at L>R USB, and at apex  Breast Exam:  No chest wall tenderness, masses or gynecomastia   Abdomen:  Soft, non-tender, nondistended,  normoactive bowel sounds, no masses, no hepatosplenomegaly   Genitalia:  Deferred to urologist per patient request  Rectal:  Deferred to urologist/GI per patient request  Extremities:  No clubbing, cyanosis; trace edema noted bilaterally  Pulses:  2+ and symmetric all extremities   Skin:  Skin color, texture, turgor normal, no rashes or lesions noted  Lymph nodes:  Cervical, supraclavicular, and axillary nodes normal   Neurologic:  CNII-XII intact, normal strength, sensation and gait; reflexes 2+ and symmetric throughout   Psych: Normal mood, affect, hygiene and grooming   ASSESSMENT/PLAN:  Medicare annual wellness visit, subsequent - Plan: POCT Urinalysis DIP (Proadvantage Device), ABO  Screening for prostate cancer - discussed importance of exam (helps to interpret an elevated PSA), and he reports  will schedule with urologist - Plan: PSA  Mild HOCM (hypertrophic obstructive cardiomyopathy) ([place]) - currently asymptomatic; has f/u with Dr. Harrell Gave next week  Nonocclusive coronary atherosclerosis of native coronary artery - cont current meds; under care of cardiologist, f/u next week.  Currently asymptomatic  Elevated coronary artery calcium score - cont statin  Essential hypertension - controlled  Impaired fasting glucose - counseled re: proper diet, exercise, wt loss - Plan: HgB A1c, Comprehensive metabolic panel  Pure hypercholesterolemia - Plan: Lipid panel  Vitamin D deficiency - cont daily supplement - Plan: VITAMIN D 25 Hydroxy (Vit-D Deficiency, Fractures)  Gastroesophageal reflux disease without esophagitis - discussed risks of PPI vs untreated reflux, lowest effective dose recommended. to d/w Dr. Earlean Shawl if EGD needed when has colonoscopy in May  Medication monitoring encounter - Plan: Lipid panel, Comprehensive metabolic panel, CBC with Differential/Platelet, VITAMIN D 25 Hydroxy (Vit-D Deficiency, Fractures)  Vaccine counseling - strongly encouraged  flu shot and COVID booster (pfizer/moderna preferred, but okay for J&J booster if he prefers)--both declined  Peripheral edema - possibly related to increased sodium intake in diet (soups, food prepared by others). encouraged low Na diet, exercise  Class 1 obesity due to excess calories with serious comorbidity and body mass index (BMI) of 34.0 to 34.9 in adult - comorbidities include IFG, CAD, HLD, GERD. Counseled re: diet, exercise, risks of obesity   Recommended at least 30 minutes of aerobic activity at least 5 days/week, weight bearing exercise at least 2x/week;proper sunscreen use reviewed; healthy diet and alcohol recommendations (less than or equal to 2 drinks/day) reviewed; regular seatbelt use; changing batteries in smoke detectors. Immunization recommendations discussed--encouraged yearly high dose flu shots (declined today; discussed benefit even though he had flu).  COVID booster recommended, counseled, declined. To get TdaP andShingrix from pharmacy. Colonoscopy recommendations reviewed--due now, scheduled for May 2022.   MOST form filled out. Full Code, Full Care Encouraged to complete living will, healthcare power of attorney, and get [place] copies once complete.  Apparently the situation is "complicated", requiring attorney rather than being notarized.  F/u6 mos   Medicare Attestation I have personally reviewed: The patient's medical and social history Their use of alcohol, tobacco or illicit drugs Their current medications and supplements The patient's functional ability including ADLs,fall risks, home safety risks, cognitive, and hearing and visual impairment Diet and physical activities Evidence for depression or mood disorders  The patient's weight, height, BMI have been recorded in the chart.  I have made referrals, counseling, and provided education to the patient based on review of the above and I have provided the patient with a written personalized care plan  for preventive services.

## 2020-08-02 ENCOUNTER — Ambulatory Visit (INDEPENDENT_AMBULATORY_CARE_PROVIDER_SITE_OTHER): Payer: Medicare Other | Admitting: Family Medicine

## 2020-08-02 ENCOUNTER — Other Ambulatory Visit: Payer: Self-pay

## 2020-08-02 ENCOUNTER — Encounter: Payer: Self-pay | Admitting: Family Medicine

## 2020-08-02 VITALS — BP 136/76 | HR 72 | Ht 69.5 in | Wt 240.0 lb

## 2020-08-02 DIAGNOSIS — E559 Vitamin D deficiency, unspecified: Secondary | ICD-10-CM | POA: Diagnosis not present

## 2020-08-02 DIAGNOSIS — Z0000 Encounter for general adult medical examination without abnormal findings: Secondary | ICD-10-CM | POA: Diagnosis not present

## 2020-08-02 DIAGNOSIS — Z6834 Body mass index (BMI) 34.0-34.9, adult: Secondary | ICD-10-CM

## 2020-08-02 DIAGNOSIS — R609 Edema, unspecified: Secondary | ICD-10-CM

## 2020-08-02 DIAGNOSIS — R600 Localized edema: Secondary | ICD-10-CM

## 2020-08-02 DIAGNOSIS — I2510 Atherosclerotic heart disease of native coronary artery without angina pectoris: Secondary | ICD-10-CM

## 2020-08-02 DIAGNOSIS — Z125 Encounter for screening for malignant neoplasm of prostate: Secondary | ICD-10-CM | POA: Diagnosis not present

## 2020-08-02 DIAGNOSIS — K219 Gastro-esophageal reflux disease without esophagitis: Secondary | ICD-10-CM

## 2020-08-02 DIAGNOSIS — I421 Obstructive hypertrophic cardiomyopathy: Secondary | ICD-10-CM | POA: Diagnosis not present

## 2020-08-02 DIAGNOSIS — Z7185 Encounter for immunization safety counseling: Secondary | ICD-10-CM | POA: Diagnosis not present

## 2020-08-02 DIAGNOSIS — E6609 Other obesity due to excess calories: Secondary | ICD-10-CM | POA: Diagnosis not present

## 2020-08-02 DIAGNOSIS — I10 Essential (primary) hypertension: Secondary | ICD-10-CM | POA: Diagnosis not present

## 2020-08-02 DIAGNOSIS — Z5181 Encounter for therapeutic drug level monitoring: Secondary | ICD-10-CM

## 2020-08-02 DIAGNOSIS — E7800 Pure hypercholesterolemia, unspecified: Secondary | ICD-10-CM

## 2020-08-02 DIAGNOSIS — R931 Abnormal findings on diagnostic imaging of heart and coronary circulation: Secondary | ICD-10-CM | POA: Diagnosis not present

## 2020-08-02 DIAGNOSIS — E66811 Obesity, class 1: Secondary | ICD-10-CM

## 2020-08-02 DIAGNOSIS — R7301 Impaired fasting glucose: Secondary | ICD-10-CM

## 2020-08-02 LAB — POCT URINALYSIS DIP (PROADVANTAGE DEVICE)
Bilirubin, UA: NEGATIVE
Blood, UA: NEGATIVE
Glucose, UA: NEGATIVE mg/dL
Ketones, POC UA: NEGATIVE mg/dL
Leukocytes, UA: NEGATIVE
Nitrite, UA: NEGATIVE
Protein Ur, POC: NEGATIVE mg/dL
Specific Gravity, Urine: 1.015
Urobilinogen, Ur: NEGATIVE
pH, UA: 6.5 (ref 5.0–8.0)

## 2020-08-02 LAB — POCT GLYCOSYLATED HEMOGLOBIN (HGB A1C): Hemoglobin A1C: 5.8 % — AB (ref 4.0–5.6)

## 2020-08-03 LAB — CBC WITH DIFFERENTIAL/PLATELET
Basophils Absolute: 0.1 10*3/uL (ref 0.0–0.2)
Basos: 1 %
EOS (ABSOLUTE): 0.1 10*3/uL (ref 0.0–0.4)
Eos: 0 %
Hematocrit: 44.5 % (ref 37.5–51.0)
Hemoglobin: 15.1 g/dL (ref 13.0–17.7)
Immature Grans (Abs): 0.1 10*3/uL (ref 0.0–0.1)
Immature Granulocytes: 1 %
Lymphocytes Absolute: 2.4 10*3/uL (ref 0.7–3.1)
Lymphs: 17 %
MCH: 27.5 pg (ref 26.6–33.0)
MCHC: 33.9 g/dL (ref 31.5–35.7)
MCV: 81 fL (ref 79–97)
Monocytes Absolute: 1.2 10*3/uL — ABNORMAL HIGH (ref 0.1–0.9)
Monocytes: 8 %
Neutrophils Absolute: 10.6 10*3/uL — ABNORMAL HIGH (ref 1.4–7.0)
Neutrophils: 73 %
Platelets: 250 10*3/uL (ref 150–450)
RBC: 5.5 x10E6/uL (ref 4.14–5.80)
RDW: 13.7 % (ref 11.6–15.4)
WBC: 14.4 10*3/uL — ABNORMAL HIGH (ref 3.4–10.8)

## 2020-08-03 LAB — COMPREHENSIVE METABOLIC PANEL
ALT: 21 IU/L (ref 0–44)
AST: 18 IU/L (ref 0–40)
Albumin/Globulin Ratio: 1.8 (ref 1.2–2.2)
Albumin: 4.4 g/dL (ref 3.8–4.8)
Alkaline Phosphatase: 91 IU/L (ref 44–121)
BUN/Creatinine Ratio: 22 (ref 10–24)
BUN: 21 mg/dL (ref 8–27)
Bilirubin Total: 0.9 mg/dL (ref 0.0–1.2)
CO2: 20 mmol/L (ref 20–29)
Calcium: 9.4 mg/dL (ref 8.6–10.2)
Chloride: 101 mmol/L (ref 96–106)
Creatinine, Ser: 0.96 mg/dL (ref 0.76–1.27)
GFR calc Af Amer: 92 mL/min/{1.73_m2} (ref >59)
GFR calc non Af Amer: 80 mL/min/{1.73_m2} (ref >59)
Globulin, Total: 2.5 g/dL (ref 1.5–4.5)
Glucose: 96 mg/dL (ref 65–99)
Potassium: 4.3 mmol/L (ref 3.5–5.2)
Sodium: 138 mmol/L (ref 134–144)
Total Protein: 6.9 g/dL (ref 6.0–8.5)

## 2020-08-03 LAB — ABO

## 2020-08-03 LAB — LIPID PANEL
Chol/HDL Ratio: 2.7 ratio (ref 0.0–5.0)
Cholesterol, Total: 140 mg/dL (ref 100–199)
HDL: 51 mg/dL (ref >39)
LDL Chol Calc (NIH): 75 mg/dL (ref 0–99)
Triglycerides: 71 mg/dL (ref 0–149)
VLDL Cholesterol Cal: 14 mg/dL (ref 5–40)

## 2020-08-03 LAB — PSA: Prostate Specific Ag, Serum: 4.2 ng/mL — ABNORMAL HIGH (ref 0.0–4.0)

## 2020-08-03 LAB — VITAMIN D 25 HYDROXY (VIT D DEFICIENCY, FRACTURES): Vit D, 25-Hydroxy: 32.8 ng/mL (ref 30.0–100.0)

## 2020-08-04 ENCOUNTER — Encounter: Payer: Self-pay | Admitting: Family Medicine

## 2020-08-05 ENCOUNTER — Other Ambulatory Visit: Payer: Self-pay | Admitting: Family Medicine

## 2020-08-05 DIAGNOSIS — D72829 Elevated white blood cell count, unspecified: Secondary | ICD-10-CM

## 2020-08-07 ENCOUNTER — Encounter: Payer: Self-pay | Admitting: Cardiology

## 2020-08-07 ENCOUNTER — Ambulatory Visit (INDEPENDENT_AMBULATORY_CARE_PROVIDER_SITE_OTHER): Payer: Medicare Other | Admitting: Cardiology

## 2020-08-07 ENCOUNTER — Other Ambulatory Visit: Payer: Self-pay

## 2020-08-07 VITALS — BP 126/60 | HR 83 | Ht 70.0 in | Wt 240.6 lb

## 2020-08-07 DIAGNOSIS — I10 Essential (primary) hypertension: Secondary | ICD-10-CM

## 2020-08-07 DIAGNOSIS — E7800 Pure hypercholesterolemia, unspecified: Secondary | ICD-10-CM

## 2020-08-07 DIAGNOSIS — I422 Other hypertrophic cardiomyopathy: Secondary | ICD-10-CM | POA: Diagnosis not present

## 2020-08-07 DIAGNOSIS — R079 Chest pain, unspecified: Secondary | ICD-10-CM

## 2020-08-07 DIAGNOSIS — I2510 Atherosclerotic heart disease of native coronary artery without angina pectoris: Secondary | ICD-10-CM

## 2020-08-07 LAB — RH TYPE: Rh Factor: POSITIVE

## 2020-08-07 LAB — SPECIMEN STATUS REPORT

## 2020-08-07 MED ORDER — VALSARTAN 80 MG PO TABS
80.0000 mg | ORAL_TABLET | Freq: Every day | ORAL | 3 refills | Status: DC
Start: 2020-08-07 — End: 2021-04-26

## 2020-08-07 NOTE — Progress Notes (Signed)
Cardiology Office Note:    Date:  08/07/2020   ID:  Erik Leon, DOB 1951-05-14, MRN [id number]  PCP:  Erik Ohara, MD  Cardiologist:  Erik Dresser, MD PhD  Referring MD: Erik Ohara, MD   CC: follow up  History of Present Illness:    Erik Leon is a 70 y.o. male with a hx of hypertension and mild HCM who is seen for follow up. He was initially seen as a new consult at the request of Dr. Tomi Leon for evaluation of chest pain 02/01/18.  Cardiac history: Chest pain 2019: intermittent. No aggravating factors. Last 10 seconds. If he exerts himself, feels that his pulse goes faster, but doesn't get discomfort. Started several years ago. Happens once every couple months. Last occurrence two weeks ago, occurred while sitting at his desk. Feels like when "you swallow something and it goes down the wrong pipe." Feels like it is under his heart. No radiation, no associated symptoms. He reports multiple prior stress tests, though I do not have the records. Noted as stress in Epic in 2013, but only normal echo results attached.  FH: no known CAD. Brother with congenital cardiac defect (below), may have passed from SCD.  HCM: echo in 2013 pdf reports SAM, though I cannot see additional details. Noted as mild LVH, I do not have measurements or gradients. Echo here in 2017 noted septal thickness as 1.5 cm and posterior wall thickness as 1.37 cm. No gradient noted.   Today: Was sick with flu A end of December/beginning of January. Was out of the office for almost a month. Recovering gradually. Still feels lethargic, hasn't felt back to baseline since Covid in early 2020. About 85% of normal energy level. Taste hasn't come back. Only can taste very salty/very sweet foods.  BP at home 100/61 this AM. Similar the day prior. Was 140/90s just before he got sick. Lowest he saw was 88/51, was asymptomatic. Never lightheaded. No syncope. Had been ranging 110/60 normally before the flu. Does have cough  on lisinopril, discussed changing to ARB.  Starting next week with nutritionist and personal trainer.   Still with intermittent chest burning, feels like it is under the heart. Unchanged while he was ill. No sensations of fluttering/palpitations, but has cut back on coffee.   Denies shortness of breath at rest or with normal exertion. No PND, orthopnea, LE edema or unexpected weight gain. No syncope or palpitations.  Past Medical History:  Diagnosis Date  . Allergy   . Atypical mole 11/23/2000   slight to moderate- Right upper back (WS)  . Atypical mole 11/23/2000   slight to moderate-right uppermost inner thigh (WS)  . Atypical mole 10/24/2009   severe-right mid abdomen (EXC)  . Atypical mole 01/06/2019   left mi back   . Basal cell carcinoma 09/14/2012   superficial-left upper shoulder (txpbx)  . Basal cell carcinoma 11/26/2017   sup + nod-right chest (CX35FU)  . Basal cell carcinoma 01/06/2019   sup-right post shoulder (CX35FU)  . BCC (basal cell carcinoma of skin) 10/24/2009   top right shoulder (CX35FU)  . Calcaneal fracture 2014   right; s/p boot; reinjured in 2019  . Colon polyp   . Diverticulosis 6/09   rare L colon diverticuli  . Elevated PSA 10/2012   benign prostate biopsies 11/2012  . Esophageal stricture    s/p dilatation (Erik Leon)  . GERD (gastroesophageal reflux disease)   . Heart murmur   . Hypertension   . Hypertr obst  cardiomyop    mild--Erik Leon (previously)  . Internal hemorrhoid 6/09  . Migraine age 53   resolved after nasal surgery  . Nodular basal cell carcinoma (BCC) 11/18/2019   Mid Bridge Nose    Past Surgical History:  Procedure Laterality Date  . CHOLECYSTECTOMY  5/03  . COLONOSCOPY  11/2007, 2011, 2014   Erik Leon again 2019  . dental implant    . ELBOW SURGERY Right 06/2013   bone spur; Dr. Percell Leon  . ESOPHAGOGASTRODUODENOSCOPY  2012   with dilatation.  Erik Leon  . LIPOMA EXCISION     Dr. Stanford Leon from L paramedian  prevertebral space  . NASAL POLYP SURGERY  2008  . SHOULDER ARTHROSCOPY  12/03; 1/06   right; left    Current Medications: Current Outpatient Medications on File Prior to Visit  Medication Sig  . acetaminophen (TYLENOL) 325 MG tablet Take 325 mg by mouth every 6 (six) hours as needed.  Marland Kitchen aspirin 81 MG tablet Take 81 mg by mouth daily.  Marland Kitchen atorvastatin (LIPITOR) 40 MG tablet TAKE 1 TABLET(40 MG) BY MOUTH DAILY AT 6 PM  . cholecalciferol (VITAMIN D) 1000 units tablet Take 1,000 Units by mouth daily.  Marland Kitchen esomeprazole (NEXIUM) 40 MG capsule Take 40 mg by mouth 2 (two) times daily before a meal.  . fexofenadine (ALLEGRA) 180 MG tablet Take 180 mg by mouth daily.  Marland Kitchen ibuprofen (ADVIL) 200 MG tablet Take 400 mg by mouth every 6 (six) hours as needed.   No current facility-administered medications on file prior to visit.     Allergies:   Coconut fatty acids   Social History   Tobacco Use  . Smoking status: Former Smoker    Quit date: 06/24/1983    Years since quitting: 37.1  . Smokeless tobacco: Never Used  Vaping Use  . Vaping Use: Never used  Substance Use Topics  . Alcohol use: Yes    Comment: 1-2 drink/month (scotch) or less  . Drug use: No    Family History: The patient's family history includes Alcohol abuse in his brother and father; Cancer in his mother; Cirrhosis in his brother; Dementia in his mother; Hypertension in his mother; Liver cancer in his brother. There is no history of Diabetes, Prostate cancer, or Colon cancer. Half-brother had a birth defect and died after playing handball at age 10. Not HCM as far as he knows. There were 11 sons, no other with heart problems. Not sure about his dad's history, he was an alcoholic. No MI or stroke.  ROS:   Please see the history of present illness.  Additional pertinent ROS otherwise unremarkable.  EKGs/Labs/Other Studies Reviewed:    The following studies were reviewed today: Echo 2013: (pdf scan)--noted as stress, but only  resting echo in note EF >70%, mild cLVH. +SAM, trace MR  Echo 2017:  Study Conclusions - Left ventricle: The cavity size was normal. Wall thickness was   increased in a pattern of mild LVH. Systolic function was normal.   The estimated ejection fraction was in the range of 60% to 65%.   Wall motion was normal; there were no regional wall motion   abnormalities. Doppler parameters are consistent with abnormal   left ventricular relaxation (grade 1 diastolic dysfunction). - Aortic valve: There was no stenosis. - Mitral valve: There was trivial regurgitation. - Right ventricle: The cavity size was normal. Systolic function   was normal. - Pulmonary arteries: No complete TR doppler jet so unable to   estimate PA  systolic pressure. - Inferior vena cava: The vessel was normal in size. The   respirophasic diameter changes were in the normal range (>= 50%),   consistent with normal central venous pressure.  Impressions:  - Normal LV size with mild LV hypertrophy. EF 60-65%. Normal RV   size and systolic function. No significant valvular   abnormalities.  CT cardiac 03/03/2018 FINDINGS: The pulmonary veins drained normally to the left atrium. Calcium Score: 198 Agatston units Coronary Arteries: Right dominant with no anomalies LM: Short, no plaque or stenosis. LAD system: Relatively small caliber vessel. Mixed plaque proximal and mid LAD, appears to be mild stenosis. Circumflex system: Large LCx. Mixed plaque mid vessel without significant stenosis. RCA system: Calcified plaque distal RCA/PLV with mild stenosis.  IMPRESSION: 1. Coronary artery calcium score 198 Agatston units. This places the patient in the 65th percentile for age and gender, suggesting intermediate risk for future cardiac events. 2. Extensive plaque in the proximal and mid LAD. Appears to be mild stenosis. 3.  Mild stenosis in distal RCA/PLV.  FFR without hemodynamically significant stenosis  EKG:  ECG  personally reviewed, ECG from 04/17/20 shows NSR with nonspecific ST changes, similar to 2017  Recent Labs: 08/02/2020: ALT 21; BUN 21; Creatinine, Ser 0.96; Hemoglobin 15.1; Platelets 250; Potassium 4.3; Sodium 138  Recent Lipid Panel    Component Value Date/Time   CHOL 140 08/02/2020 1643   TRIG 71 08/02/2020 1643   HDL 51 08/02/2020 1643   CHOLHDL 2.7 08/02/2020 1643   CHOLHDL 4.0 02/21/2016 1035   VLDL 22 02/21/2016 1035   LDLCALC 75 08/02/2020 1643    Physical Exam:    VS:  BP 126/60   Pulse 83   Ht 5\' 10"  (1.778 m)   Wt 240 lb 9.6 oz (109.1 kg)   SpO2 97%   BMI 34.52 kg/m     Wt Readings from Last 3 Encounters:  08/07/20 240 lb 9.6 oz (109.1 kg)  08/02/20 240 lb (108.9 kg)  06/11/20 238 lb (108 kg)   GEN: Well nourished, well developed in no acute distress HEENT: Normal, moist mucous membranes NECK: No JVD CARDIAC: regular rhythm, normal S1 and S2, no rubs or gallops. 2/6 machinelike systolic murmur. VASCULAR: Radial and DP pulses 2+ bilaterally. No carotid bruits RESPIRATORY:  Clear to auscultation without rales, wheezing or rhonchi  ABDOMEN: Soft, non-tender, non-distended MUSCULOSKELETAL:  Ambulates independently SKIN: Warm and dry, no edema NEUROLOGIC:  Alert and oriented x 3. No focal neuro deficits noted. PSYCHIATRIC:  Normal affect   ASSESSMENT:    1. Essential hypertension   2. Nonocclusive coronary atherosclerosis of native coronary artery   3. Pure hypercholesterolemia   4. Hypertrophic cardiomyopathy ([place])   5. Chest pain of uncertain etiology    PLAN:    Chest discomfort: -coronary CT 02/2018 with Ca score 198, prox/mid LAD plaque but no significant stenosis by FFR -we trialed imdur. This resolved his symptoms, but it gave him a headache. Given nitro response, question whether there is a GI component. He has pending colonoscopy and follow up with GI this spring. -alternative would be to trial amlodipine, but ideally he would like to get off of  medications if possible -counseled on red flag warning signs that need immediate medical attention.  HCM -Predominantly focal septal hypertrophy. Max LVOT gradient noted on imaging was 12 mmHg. -asymptomatic, no syncope, no SCD in family related to HCM (though half brother had congenital heart condition and died age 71 while exercising, which is concerning). -had  echo in 2017, will repeat in the future based on symptoms vs. Routine monitoring  Hypertension:  -has been well controlled to low while he was sick -has cough on lisinopril, which he manages. Discussed changing to ARB to see if cough improves. Will trial this. -goal <130/80, call if home numbers rise on new ARB -will have him recheck BMET with his repeat labs in 1-2 weeks  Hypercholesterolemia: LDL goal <70.  -continue atorvastatin  Nonobstructive CAD: based on plaque/calcium on scan -continue aspirin, atorvastatin  Secondary prevention -recommend heart healthy/Mediterranean diet, with whole grains, fruits, vegetable, fish, lean meats, nuts, and olive oil. Limit salt. -recommend moderate walking, 3-5 times/week for 30-50 minutes each session. Aim for at least 150 minutes.week. Goal should be pace of 3 miles/hours, or walking 1.5 miles in 30 minutes -recommend avoidance of tobacco products. Avoid excess alcohol.  Plan for follow up: 6 mos or sooner PRN  Medication Adjustments/Labs and Tests Ordered: Current medicines are reviewed at length with the patient today.  Concerns regarding medicines are outlined above.  Orders Placed This Encounter  Procedures  . Basic metabolic panel   Meds ordered this encounter  Medications  . valsartan (DIOVAN) 80 MG tablet    Sig: Take 1 tablet (80 mg total) by mouth daily.    Dispense:  90 tablet    Refill:  3    Replaces lisinopril    Patient Instructions  Medication Instructions:  Stop Lisinopril 40 mg daily Start Valsartan 80 mg daily  *If you need a refill on your cardiac  medications before your next appointment, please call your pharmacy*   Lab Work: Your physician recommends lab work at primary care office (BMP).  If you have labs (blood work) drawn today and your tests are completely normal, you will receive your results only by: Marland Kitchen MyChart Message (if you have MyChart) OR . A paper copy in the mail If you have any lab test that is abnormal or we need to change your treatment, we will call you to review the results.   Testing/Procedures: None   Follow-Up: At [facility], you and your health needs are our priority.  As part of our continuing mission to provide you with exceptional heart care, we have created designated Provider Care Teams.  These Care Teams include your primary Cardiologist (physician) and Advanced Practice Providers (APPs -  Physician Assistants and Nurse Practitioners) who all work together to provide you with the care you need, when you need it.  We recommend signing up for the patient portal called "MyChart".  Sign up information is provided on this After Visit Summary.  MyChart is used to connect with patients for Virtual Visits (Telemedicine).  Patients are able to view lab/test results, encounter notes, upcoming appointments, etc.  Non-urgent messages can be sent to your provider as well.   To learn more about what you can do with MyChart, go to [email or web address].    Your next appointment:   6 month(s)  The format for your next appointment:   In Person  Provider:   Buford Dresser, MD       Signed, Erik Dresser, MD PhD 08/07/2020 5:00 PM    [place]

## 2020-08-07 NOTE — Patient Instructions (Signed)
Medication Instructions:  Stop Lisinopril 40 mg daily Start Valsartan 80 mg daily  *If you need a refill on your cardiac medications before your next appointment, please call your pharmacy*   Lab Work: Your physician recommends lab work at primary care office (BMP).  If you have labs (blood work) drawn today and your tests are completely normal, you will receive your results only by: Marland Kitchen MyChart Message (if you have MyChart) OR . A paper copy in the mail If you have any lab test that is abnormal or we need to change your treatment, we will call you to review the results.   Testing/Procedures: None   Follow-Up: At [facility], you and your health needs are our priority.  As part of our continuing mission to provide you with exceptional heart care, we have created designated Provider Care Teams.  These Care Teams include your primary Cardiologist (physician) and Advanced Practice Providers (APPs -  Physician Assistants and Nurse Practitioners) who all work together to provide you with the care you need, when you need it.  We recommend signing up for the patient portal called "MyChart".  Sign up information is provided on this After Visit Summary.  MyChart is used to connect with patients for Virtual Visits (Telemedicine).  Patients are able to view lab/test results, encounter notes, upcoming appointments, etc.  Non-urgent messages can be sent to your provider as well.   To learn more about what you can do with MyChart, go to [email or web address].    Your next appointment:   6 month(s)  The format for your next appointment:   In Person  Provider:   Buford Dresser, MD

## 2020-10-30 DIAGNOSIS — Z86010 Personal history of colonic polyps: Secondary | ICD-10-CM | POA: Diagnosis not present

## 2020-10-30 DIAGNOSIS — Z1211 Encounter for screening for malignant neoplasm of colon: Secondary | ICD-10-CM | POA: Diagnosis not present

## 2020-10-30 DIAGNOSIS — E669 Obesity, unspecified: Secondary | ICD-10-CM | POA: Diagnosis not present

## 2020-10-30 LAB — HM COLONOSCOPY

## 2020-11-12 NOTE — Telephone Encounter (Signed)
These are not side effects I would associate with valsartan, but we could certainly try a different ARB - maybe candesartan 8 mg.   Would also recommend that he get BMET done

## 2020-11-28 ENCOUNTER — Encounter: Payer: Self-pay | Admitting: *Deleted

## 2020-12-27 ENCOUNTER — Encounter: Payer: Self-pay | Admitting: Family Medicine

## 2020-12-29 ENCOUNTER — Encounter (HOSPITAL_BASED_OUTPATIENT_CLINIC_OR_DEPARTMENT_OTHER): Payer: Self-pay

## 2020-12-30 NOTE — Progress Notes (Signed)
Chief Complaint  Patient presents with   Leg Swelling    LE swelling since May. Lower legs and ankles. Also has a sore on lower left leg x 2 month that won't heal. Has also put on weight and hasn't changed eating habits. Very lethargic this time of day. Has been fasting since 5:30 pm yesterday. Is asking for GI recommendation.     Patient presents with complaints of LE edema, fatigue, DOE, and poss leg ulcer/nonhealing wound.  Woke up with swollen legs and ankles on 5/24. No known trigger for swelling--no long trips/travel, no change in salt intake. He had a lot more sweets during that time (holidays, birthday). About a month later he noticed a wound/sore on the left shin--he caught it on furniture, scraped it. He used neosporin. Denies any drainage. It hasn't healed (been present for a few weeks). He used a massage machine to his lower legs and feet over the past weekend. Reports swelling didn't improve, but the soreness in the wound resolved, and he noticed the wound is now starting to heal. He notes that his skin is flaky on legs, arms.  He noted some DOE when walking upstairs since May.  He started walking on the treadmill, and denies any DOE (unless on an incline). Hadn't been getting any regular exercise in a while (nothing since October). He has had dry mouth, feels lethargic--exhausted by 2-3pm daily. He has gained weight, though doesn't feel his diet changed (other than the celebrations around his birthday). He reports that on [birth year]/24 (his birthday) he weighed 238# at home, 240# the next morning. Feels big and bloated.  He reports feeling tired around 2-3pm.  He reports sleeping very well (up twice to void, gets back to sleep) and that he wakes up feeling refreshed.  Reports the prev had a normal sleep study. He occasionally snores, but this improved after nasal surgery.   Hypertension:  He last saw Dr. Harrell Gave in February, at which time he was changed from lisinopril to valsartan  (due to cough). Cough resolved with change in medication. He admits he didn't get the f/u b-met as instructed. BP's are running mostly 105-125/57-82 (mostly 10-93 diastolic), with pulse 23-55  He has history of predominantly focal septal hypertrophy, last echo was in 2017.  Coronary CT 02/2018 with Ca score 198 (intermediate risk). Extensive plaque in prox and mid-LAD, mild stenosis, and mild stenosis in distal RCA/PLV; no significant stenosis noted by FFR. Chest pain/fluttering improved with Imdur trial, but caused HA; given lack of stenosis, cardiologist wondered if there could be GI component to his CP, consider amlodipine trial (pt declined). Denies any chest pain. He was started on lipitor. Lab Results  Component Value Date   CHOL 140 08/02/2020   HDL 51 08/02/2020   LDLCALC 75 08/02/2020   TRIG 71 08/02/2020   CHOLHDL 2.7 08/02/2020    He was noted to have elevated WBC on 07/2020 labs, was due to return for recheck in a few weeks, but never did.  IFG: fasting glucose of 113 in 05/2019.  Normal at 96 in 07/2020, but A1c was 5.8%. No exercise x 3 mos, +weight gain, some sweets, though not recently. Lab Results  Component Value Date   HGBA1C 5.8 (A) 08/02/2020    Last colonoscopy--10/2020, with insufficient prep. Told to repeat in 3-4 months.  He reports he was told to find someone else, Dr. Earlean Shawl (his friend) would be retiring soon. Requesting referral. Prior colonoscopy was 06/2017, colon polyps, diverticulosis, internal hemorrhoids.  PMH, PSH, SH reviewed  Outpatient Encounter Medications as of 12/31/2020  Medication Sig   aspirin 81 MG tablet Take 81 mg by mouth daily.   atorvastatin (LIPITOR) 40 MG tablet TAKE 1 TABLET(40 MG) BY MOUTH DAILY AT 6 PM   cholecalciferol (VITAMIN D) 1000 units tablet Take 1,000 Units by mouth daily.   esomeprazole (NEXIUM) 40 MG capsule Take 40 mg by mouth 2 (two) times daily before a meal.   fexofenadine (ALLEGRA) 180 MG tablet Take 180 mg by  mouth daily.   valsartan (DIOVAN) 80 MG tablet Take 1 tablet (80 mg total) by mouth daily.   [DISCONTINUED] acetaminophen (TYLENOL) 325 MG tablet Take 325 mg by mouth every 6 (six) hours as needed.   [DISCONTINUED] ibuprofen (ADVIL) 200 MG tablet Take 400 mg by mouth every 6 (six) hours as needed.   No facility-administered encounter medications on file as of 12/31/2020.   Allergies  Allergen Reactions   Coconut Fatty Acids Swelling   ROS: No f/c/n/v/d/abd pain, dysphagia or heartburn.  +bloated in abdomen.   +edema, some DOE with stairs, per HPI. No dysuria, HA, dizziness, bleeding/bruising.  Occ BRB in toilet (hemorrhoidal)   PHYSICAL EXAM:  BP 128/70   Pulse 72   Ht 5' 10" (1.778 m)   Wt 248 lb 3.2 oz (112.6 kg)   BMI 35.61 kg/m    Wt Readings from Last 3 Encounters:  12/31/20 248 lb 3.2 oz (112.6 kg)  08/07/20 240 lb 9.6 oz (109.1 kg)  08/02/20 240 lb (108.9 kg)   Wt 238# in 05/2020  Well-appearing, obese male, in good spirits. He is in no distress, speaking easily and comfortably HEENT: conjunctiva and sclera are clear, EOMI, wearing mask Neck: No lymphadenopathy, thyromegaly or carotid bruit Heart: regular rate and rhythm Murmur noted at L>R USB, unchanged. Chest: nontender Lungs: clear bilaterally Back: no spinal or CVA tenderness Abdomen: obese, soft, nontender, no organomegaly or mass Extremities: 2+ pulses.  1-2+ pitting edema bilaterally. Calves nontender. Skin:  1.5 x 1.5 cm lesion L ant shin, central portion 0.5 cm open, red, slight crusting.  Glu 95 (pt fasting)  Lab Results  Component Value Date   HGBA1C 6.2 (A) 12/31/2020     ASSESSMENT/PLAN:  Peripheral edema - Briefly reviewed Ddx. Start with labs, consider CXR. Will need echo (pt trying to sched with cards). Low Na diet, compression socks and leg elevation. Lasix prn - Plan: TSH, Brain natriuretic peptide, furosemide (LASIX) 20 MG tablet, CANCELED: TSH, CANCELED: Brain natriuretic  peptide  Leukocytosis, unspecified type - noted at Feb visit, due for recheck. Also check CBC due to worsening fatigue, DOE - Plan: CBC with Differential/Platelet  Essential hypertension - well controlled - Plan: Comprehensive metabolic panel  Impaired fasting glucose - A1c higher than last check. Encouraged weight loss, proper diet, exercise if tolerated - Plan: Comprehensive metabolic panel, HgB S0F, Glucose (CBG), Fasting  DOE (dyspnea on exertion) - May be related to weight gain and relative inactivity. Prev nonobstructive CAD noted.  to f/u with cardiologist - Plan: CBC with Differential/Platelet, TSH, Brain natriuretic peptide, CANCELED: TSH, CANCELED: Brain natriuretic peptide  Fatigue, unspecified type - Plan: CBC with Differential/Platelet, Comprehensive metabolic panel, TSH, CANCELED: TSH  Wound cellulitis - very mild, reportedly recently improving per pt. Treat with bactroban - Plan: mupirocin ointment (BACTROBAN) 2 %  Polyp of colon, unspecified part of colon, unspecified type - Plan: Ambulatory referral to Gastroenterology  C-met, TSH, CBC, BNP Results to Dr. Harrell Gave Pt to f/u with Dr.  Christopher. Suspect he should have f/u echo done.  Will use Lasix short-term to help with peripheral edema.  He hasn't used before, to start with 26m, and increase to 20 if ineffective.  Counseled re: K+ rich foods and to stay well hydrated. Counseled re: low Na diet, compression, leg elevation.  Should f/u in 1 week (here, or if has appt with cardiologist, can f/u there)--will need re-eval of edema, poss b-met if still taking lasix.   Due for repeat colonoscopy due to inadequate prep on one in May. Refer to [facility] [place] (pt requests referral, Dr. MEarlean Shawlto retire soon).  I spent 50 minutes dedicated to the care of this patient, including pre-visit review of records, face to face time, post-visit ordering of testing and documentation.

## 2020-12-31 ENCOUNTER — Encounter: Payer: Self-pay | Admitting: Family Medicine

## 2020-12-31 ENCOUNTER — Other Ambulatory Visit: Payer: Self-pay

## 2020-12-31 ENCOUNTER — Ambulatory Visit (INDEPENDENT_AMBULATORY_CARE_PROVIDER_SITE_OTHER): Payer: Medicare Other | Admitting: Family Medicine

## 2020-12-31 VITALS — BP 128/70 | HR 72 | Ht 70.0 in | Wt 248.2 lb

## 2020-12-31 DIAGNOSIS — I2510 Atherosclerotic heart disease of native coronary artery without angina pectoris: Secondary | ICD-10-CM

## 2020-12-31 DIAGNOSIS — R5383 Other fatigue: Secondary | ICD-10-CM

## 2020-12-31 DIAGNOSIS — D72829 Elevated white blood cell count, unspecified: Secondary | ICD-10-CM

## 2020-12-31 DIAGNOSIS — K635 Polyp of colon: Secondary | ICD-10-CM

## 2020-12-31 DIAGNOSIS — L0390 Cellulitis, unspecified: Secondary | ICD-10-CM | POA: Diagnosis not present

## 2020-12-31 DIAGNOSIS — R0600 Dyspnea, unspecified: Secondary | ICD-10-CM | POA: Diagnosis not present

## 2020-12-31 DIAGNOSIS — R0609 Other forms of dyspnea: Secondary | ICD-10-CM

## 2020-12-31 DIAGNOSIS — R609 Edema, unspecified: Secondary | ICD-10-CM | POA: Diagnosis not present

## 2020-12-31 DIAGNOSIS — I10 Essential (primary) hypertension: Secondary | ICD-10-CM

## 2020-12-31 DIAGNOSIS — R7301 Impaired fasting glucose: Secondary | ICD-10-CM | POA: Diagnosis not present

## 2020-12-31 LAB — CBC WITH DIFFERENTIAL/PLATELET
Basophils Absolute: 0.1 10*3/uL (ref 0.0–0.2)
Basos: 1 %
EOS (ABSOLUTE): 0.2 10*3/uL (ref 0.0–0.4)
Eos: 2 %
Hematocrit: 40.5 % (ref 37.5–51.0)
Hemoglobin: 13.6 g/dL (ref 13.0–17.7)
Lymphocytes Absolute: 2.3 10*3/uL (ref 0.7–3.1)
Lymphs: 27 %
MCH: 26.9 pg (ref 26.6–33.0)
MCHC: 33.6 g/dL (ref 31.5–35.7)
MCV: 80 fL (ref 79–97)
Monocytes Absolute: 0.8 10*3/uL (ref 0.1–0.9)
Monocytes: 9 %
Neutrophils Absolute: 5.3 10*3/uL (ref 1.4–7.0)
Neutrophils: 61 %
Platelets: 185 10*3/uL (ref 150–450)
RBC: 5.05 x10E6/uL (ref 4.14–5.80)
RDW: 14.5 % (ref 11.6–15.4)
WBC: 8.6 10*3/uL (ref 3.4–10.8)

## 2020-12-31 LAB — COMPREHENSIVE METABOLIC PANEL
ALT: 22 IU/L (ref 0–44)
AST: 22 IU/L (ref 0–40)
Albumin/Globulin Ratio: 2.4 — ABNORMAL HIGH (ref 1.2–2.2)
Albumin: 4.4 g/dL (ref 3.7–4.7)
Alkaline Phosphatase: 99 IU/L (ref 44–121)
BUN/Creatinine Ratio: 15 (ref 10–24)
BUN: 16 mg/dL (ref 8–27)
Bilirubin Total: 0.8 mg/dL (ref 0.0–1.2)
CO2: 28 mmol/L (ref 20–29)
Calcium: 9.4 mg/dL (ref 8.6–10.2)
Chloride: 103 mmol/L (ref 96–106)
Creatinine, Ser: 1.08 mg/dL (ref 0.76–1.27)
Globulin, Total: 1.8 g/dL (ref 1.5–4.5)
Glucose: 96 mg/dL (ref 65–99)
Potassium: 4.1 mmol/L (ref 3.5–5.2)
Sodium: 140 mmol/L (ref 134–144)
Total Protein: 6.2 g/dL (ref 6.0–8.5)
eGFR: 73 mL/min/{1.73_m2} (ref >59)

## 2020-12-31 LAB — POCT GLYCOSYLATED HEMOGLOBIN (HGB A1C): Hemoglobin A1C: 6.2 % — AB (ref 4.0–5.6)

## 2020-12-31 LAB — POCT CBG (FASTING - GLUCOSE)-MANUAL ENTRY: Glucose Fasting, POC: 95 mg/dL (ref 70–99)

## 2020-12-31 MED ORDER — FUROSEMIDE 20 MG PO TABS: 10.0000 mg | ORAL_TABLET | Freq: Every day | ORAL | 0 refills | Status: DC | PRN

## 2020-12-31 MED ORDER — MUPIROCIN 2 % EX OINT: 1.0000 "application " | TOPICAL_OINTMENT | Freq: Three times a day (TID) | CUTANEOUS | 0 refills | Status: DC

## 2020-12-31 NOTE — Patient Instructions (Addendum)
Take furosemide 1/2 tablet once daily in the morning for the next few days, until swelling has improved. Increase to the full tablet if half  isn't doing much for fluid loss. Eat a banana daily while taking the diuretic. This isn't intended for long-term at this time, just to get rid of some of the swelling in your legs.   Check your weights daily. Check your blood pressure--let us know if it is running <326 systolic on a regular basis and if you're feeling dizzy. With it being summer,and you being on a diuretic, it is VERY important that you stay well hydrated.  It is very important that you limit the sodium in your diet.  Low-Sodium Eating Plan Sodium, which is an element that makes up salt, helps you maintain a healthy balance of fluids in your body. Too much sodium can increase your bloodpressure and cause fluid and waste to be held in your body. Your health care provider or dietitian may recommend following this plan if you have high blood pressure (hypertension), kidney disease, liver disease, or heart failure. Eating less sodium can help lower your blood pressure, reduce swelling, and protect your heart, liver, andkidneys. What are tips for following this plan? Reading food labels The Nutrition Facts label lists the amount of sodium in one serving of the food. If you eat more than one serving, you must multiply the listed amount of sodium by the number of servings. Choose foods with less than 140 mg of sodium per serving. Avoid foods with 300 mg of sodium or more per serving. Shopping  Look for lower-sodium products, often labeled as "low-sodium" or "no salt added." Always check the sodium content, even if foods are labeled as "unsalted" or "no salt added." Buy fresh foods. Avoid canned foods and pre-made or frozen meals. Avoid canned, cured, or processed meats. Buy breads that have less than 80 mg of sodium per slice.  Cooking  Eat more home-cooked food and less restaurant,  buffet, and fast food. Avoid adding salt when cooking. Use salt-free seasonings or herbs instead of table salt or sea salt. Check with your health care provider or pharmacist before using salt substitutes. Cook with plant-based oils, such as canola, sunflower, or olive oil.  Meal planning When eating at a restaurant, ask that your food be prepared with less salt or no salt, if possible. Avoid dishes labeled as brined, pickled, cured, smoked, or made with soy sauce, miso, or teriyaki sauce. Avoid foods that contain MSG (monosodium glutamate). MSG is sometimes added to [place] food, bouillon, and some canned foods. Make meals that can be grilled, baked, poached, roasted, or steamed. These are generally made with less sodium. General information Most people on this plan should limit their sodium intake to 1,500-2,000 mg (milligrams) of sodium each day. What foods should I eat? Fruits Fresh, frozen, or canned fruit. Fruit juice. Vegetables Fresh or frozen vegetables. "No salt added" canned vegetables. "No salt added"tomato sauce and paste. Low-sodium or reduced-sodium tomato and vegetable juice. Grains Low-sodium cereals, including oats, puffed wheat and rice, and shredded wheat. Low-sodium crackers. Unsalted rice. Unsalted pasta. Low-sodium bread.Whole-grain breads and whole-grain pasta. Meats and other proteins Fresh or frozen (no salt added) meat, poultry, seafood, and fish. Low-sodium canned tuna and salmon. Unsalted nuts. Dried peas, beans, and lentils withoutadded salt. Unsalted canned beans. Eggs. Unsalted nut butters. Dairy Milk. Soy milk. Cheese that is naturally low in sodium, such as ricotta cheese, fresh mozzarella, or Swiss cheese. Low-sodium or reduced-sodium cheese. Creamcheese.  Yogurt. Seasonings and condiments Fresh and dried herbs and spices. Salt-free seasonings. Low-sodium mustard and ketchup. Sodium-free salad dressing. Sodium-free light mayonnaise. Fresh orrefrigerated  horseradish. Lemon juice. Vinegar. Other foods Homemade, reduced-sodium, or low-sodium soups. Unsalted popcorn and pretzels.Low-salt or salt-free chips. The items listed above may not be a complete list of foods and beverages you can eat. Contact a dietitian for more information. What foods should I avoid? Vegetables Sauerkraut, pickled vegetables, and relishes. Olives. [place] fries. Onion rings. Regular canned vegetables (not low-sodium or reduced-sodium). Regular canned tomato sauce and paste (not low-sodium or reduced-sodium). Regular tomato and vegetable juice (not low-sodium or reduced-sodium). Frozenvegetables in sauces. Grains Instant hot cereals. Bread stuffing, pancake, and biscuit mixes. Croutons. Seasoned rice or pasta mixes. Noodle soup cups. Boxed or frozen macaroni andcheese. Regular salted crackers. Self-rising flour. Meats and other proteins Meat or fish that is salted, canned, smoked, spiced, or pickled. Precooked or cured meat, such as sausages or meat loaves. Berniece Salines. Ham. Pepperoni. Hot dogs. Corned beef. Chipped beef. Salt pork. Jerky. Pickled herring. Anchovies andsardines. Regular canned tuna. Salted nuts. Dairy Processed cheese and cheese spreads. Hard cheeses. Cheese curds. Blue cheese.Feta cheese. String cheese. Regular cottage cheese. Buttermilk. Canned milk. Fats and oils Salted butter. Regular margarine. Ghee. Bacon fat. Seasonings and condiments Onion salt, garlic salt, seasoned salt, table salt, and sea salt. Canned and packaged gravies. Worcestershire sauce. Tartar sauce. Barbecue sauce. Teriyaki sauce. Soy sauce, including reduced-sodium. Steak sauce. Fish sauce. Oyster sauce. Cocktail sauce. Horseradish that you find on the shelf. Regular ketchup and mustard. Meat flavorings and tenderizers. Bouillon cubes. Hot sauce. Pre-made or packaged marinades. Pre-made or packaged taco seasonings. Relishes.Regular salad dressings. Salsa. Other foods Salted popcorn and pretzels.  Corn chips and puffs. Potato and tortilla chips.Canned or dried soups. Pizza. Frozen entrees and pot pies. The items listed above may not be a complete list of foods and beverages you should avoid. Contact a dietitian for more information. Summary Eating less sodium can help lower your blood pressure, reduce swelling, and protect your heart, liver, and kidneys. Most people on this plan should limit their sodium intake to 1,500-2,000 mg (milligrams) of sodium each day. Canned, boxed, and frozen foods are high in sodium. Restaurant foods, fast foods, and pizza are also very high in sodium. You also get sodium by adding salt to food. Try to cook at home, eat more fresh fruits and vegetables, and eat less fast food and canned, processed, or prepared foods. This information is not intended to replace advice given to you by your health care provider. Make sure you discuss any questions you have with your healthcare provider. Document Revised: 07/15/2019 Document Reviewed: 05/11/2019 Elsevier Patient Education  2022 [employer].

## 2021-01-01 ENCOUNTER — Encounter (HOSPITAL_BASED_OUTPATIENT_CLINIC_OR_DEPARTMENT_OTHER): Payer: Self-pay

## 2021-01-01 ENCOUNTER — Encounter: Payer: Self-pay | Admitting: Family Medicine

## 2021-01-01 DIAGNOSIS — I422 Other hypertrophic cardiomyopathy: Secondary | ICD-10-CM

## 2021-01-01 LAB — BRAIN NATRIURETIC PEPTIDE: BNP: 11.2 pg/mL (ref 0.0–100.0)

## 2021-01-01 LAB — TSH: TSH: 0.731 u[IU]/mL (ref 0.450–4.500)

## 2021-01-03 ENCOUNTER — Ambulatory Visit (HOSPITAL_COMMUNITY): Payer: Medicare Other | Attending: Cardiology

## 2021-01-03 ENCOUNTER — Other Ambulatory Visit: Payer: Self-pay

## 2021-01-03 DIAGNOSIS — I422 Other hypertrophic cardiomyopathy: Secondary | ICD-10-CM | POA: Insufficient documentation

## 2021-01-03 LAB — ECHOCARDIOGRAM COMPLETE
Area-P 1/2: 2.9 cm2
S' Lateral: 2.4 cm

## 2021-01-03 MED ORDER — PERFLUTREN LIPID MICROSPHERE
1.0000 mL | INTRAVENOUS | Status: AC | PRN
  Administered 2021-01-03: 3 mL via INTRAVENOUS

## 2021-01-06 NOTE — Progress Notes (Signed)
Chief Complaint  Patient presents with   Follow-up    1 week follow up. Still has some swelling, feeling a little better. Still feels lethargic.     Patient presents for 1 week follow-up on bilateral lower extremity edema, DOE (incline on TM, stairs), fatigue. He had labs done, also had echo done, and has appt scheduled for next week with cardiology NP. He has been taking Lasix 29m once daily, and eating a banana. Denies muscle cramps.  He reports that the swelling went down a lot ("I could see the veins"), but this morning he noticed more swelling.  He reports eating some sausage 2 days ago (in homemade lasagna), and yesterday someone brought him chicken--he doesn't taste salt (or sugar) well, so isn't sure if it could have been overly seasoned or not. He reports eating some fried food (in his diet in general), no sweets. Weight was 242# this morning at home, was 248# on 7/12 at home.   Home BP's 102-127/60-67, pulse 60s  He is feeling somewhat better--the tingling in his feet and legs have improved a lot. Energy improved a lot (though is tired today). Didn't notice the DOE when walking in his neighborhood  Review of labs: Lab Results  Component Value Date   HGBA1C 6.2 (A) 12/31/2020     Chemistry      Component Value Date/Time   NA 140 12/31/2020 1549   K 4.1 12/31/2020 1549   CL 103 12/31/2020 1549   CO2 28 12/31/2020 1549   BUN 16 12/31/2020 1549   CREATININE 1.08 12/31/2020 1549   CREATININE 1.12 07/19/2015 0001      Component Value Date/Time   CALCIUM 9.4 12/31/2020 1549   ALKPHOS 99 12/31/2020 1549   AST 22 12/31/2020 1549   ALT 22 12/31/2020 1549   BILITOT 0.8 12/31/2020 1549     Fasting glu 96  Lab Results  Component Value Date   WBC 8.6 12/31/2020   HGB 13.6 12/31/2020   HCT 40.5 12/31/2020   MCV 80 12/31/2020   PLT 185 12/31/2020   BNP normal at 11.2  Echocardiogram: IMPRESSIONS   1. Left ventricular ejection fraction, by estimation, is 60 to 65%.  The  left ventricle has normal function. The left ventricle has no regional  wall motion abnormalities. There is moderate left ventricular hypertrophy  of the basal-septal segment. Left  ventricular diastolic parameters are consistent with Grade I diastolic  dysfunction (impaired relaxation). Elevated left ventricular end-diastolic  pressure.   2. Right ventricular systolic function is normal. The right ventricular  size is normal.   3. The mitral valve is normal in structure. Trivial mitral valve  regurgitation. No evidence of mitral stenosis.   4. The aortic valve is normal in structure. Aortic valve regurgitation is  not visualized. No aortic stenosis is present.   5. The inferior vena cava is normal in size with greater than 50%  respiratory variability, suggesting right atrial pressure of 3 mmHg.    PMH, PSH, SH reviewed  Outpatient Encounter Medications as of 01/07/2021  Medication Sig Note   aspirin 81 MG tablet Take 81 mg by mouth daily.    atorvastatin (LIPITOR) 40 MG tablet TAKE 1 TABLET(40 MG) BY MOUTH DAILY AT 6 PM    cholecalciferol (VITAMIN D) 1000 units tablet Take 1,000 Units by mouth daily.    esomeprazole (NEXIUM) 40 MG capsule Take 40 mg by mouth 2 (two) times daily before a meal.    fexofenadine (ALLEGRA) 180 MG tablet Take  180 mg by mouth daily.    valsartan (DIOVAN) 80 MG tablet Take 1 tablet (80 mg total) by mouth daily.    [DISCONTINUED] furosemide (LASIX) 20 MG tablet Take 0.5-1 tablets (10-20 mg total) by mouth daily as needed for fluid or edema. 01/07/2021: Taking 1 tablet daily   furosemide (LASIX) 20 MG tablet Take 1 tablet (20 mg total) by mouth daily as needed for fluid or edema.    mupirocin ointment (BACTROBAN) 2 % Apply 1 application topically 3 (three) times daily. (Patient not taking: Reported on 01/07/2021)    No facility-administered encounter medications on file as of 01/07/2021.   Allergies  Allergen Reactions   Coconut Fatty Acids Swelling     ROS: no fever, chills, headaches, dizziness, URI symptoms. DOE has improved.  Edema improved, but recurred some.  No n/v/d or urinary complaints. No muscle cramps or spasms.  Moods are good.  Tingling in feet improved. See HPI   PHYSICAL EXAM:  BP 128/70   Pulse 84   Ht _0  (1.778 m)   Wt 246 lb (111.6 kg)   BMI 35.30 kg/m   Wt Readings from Last 3 Encounters:  01/07/21 246 lb (111.6 kg)  12/31/20 248 lb 3.2 oz (112.6 kg)  08/07/20 240 lb 9.6 oz (109.1 kg)   Well appearing, pleasant male with abdominal obesity.  In good spirits, in no distress HEENT: conjunctiva and sclera are clear, EOMI, wearing mask Neck; no lymphadenopathy, thyromegaly or mass Heart: regular rate and rhythm Lungs: clear bilaterally Back: no spinal or CVA tenderness Extremities: 1+ pitting edema to mid-shin R>L Psych: normal mood, affect, hygiene and grooming Neuro: alert and oriented, normal gait, strenth   ASSESSMENT/PLAN:  Peripheral edema - reportedly improved/recurred.  Low Na diet discussed again. Encouraged compression socks, leg elevation. Cont lasix prn - Plan: furosemide (LASIX) 20 MG tablet  Medication monitoring encounter - Plan: Basic metabolic panel  DOE (dyspnea on exertion) - improved; echo reassuring, has f/u with cardiology next week  Essential hypertension - controlled, cont current meds  Reassured regarding the various potential causes that were ruled out with his normal work-up.  Diet hasn't been quite as sodium-restricted as it could be--reviewed sodium sources. Encouraged him to cut back on fried foods, processed meats. To use compression socks daily. Use lasix just prn, and bananas along with the lasix. Check b-met today to ensure no potassium is needed.   F/u as scheduled with cardiology next week.  I spent 32 minutes dedicated to the care of this patient, including pre-visit review of records, face to face time, post-visit ordering of testing and  documentation.

## 2021-01-07 ENCOUNTER — Other Ambulatory Visit: Payer: Self-pay

## 2021-01-07 ENCOUNTER — Encounter: Payer: Self-pay | Admitting: Family Medicine

## 2021-01-07 ENCOUNTER — Ambulatory Visit (INDEPENDENT_AMBULATORY_CARE_PROVIDER_SITE_OTHER): Payer: Medicare Other | Admitting: Family Medicine

## 2021-01-07 VITALS — BP 128/70 | HR 84 | Ht 70.0 in | Wt 246.0 lb

## 2021-01-07 DIAGNOSIS — I10 Essential (primary) hypertension: Secondary | ICD-10-CM | POA: Diagnosis not present

## 2021-01-07 DIAGNOSIS — Z5181 Encounter for therapeutic drug level monitoring: Secondary | ICD-10-CM

## 2021-01-07 DIAGNOSIS — I2510 Atherosclerotic heart disease of native coronary artery without angina pectoris: Secondary | ICD-10-CM | POA: Diagnosis not present

## 2021-01-07 DIAGNOSIS — R0600 Dyspnea, unspecified: Secondary | ICD-10-CM

## 2021-01-07 DIAGNOSIS — R609 Edema, unspecified: Secondary | ICD-10-CM

## 2021-01-07 DIAGNOSIS — R0609 Other forms of dyspnea: Secondary | ICD-10-CM

## 2021-01-07 LAB — BASIC METABOLIC PANEL
BUN/Creatinine Ratio: 14 (ref 10–24)
BUN: 16 mg/dL (ref 8–27)
CO2: 26 mmol/L (ref 20–29)
Calcium: 9.2 mg/dL (ref 8.6–10.2)
Chloride: 100 mmol/L (ref 96–106)
Creatinine, Ser: 1.16 mg/dL (ref 0.76–1.27)
Glucose: 90 mg/dL (ref 65–99)
Potassium: 4.9 mmol/L (ref 3.5–5.2)
Sodium: 140 mmol/L (ref 134–144)
eGFR: 67 mL/min/{1.73_m2} (ref >59)

## 2021-01-07 MED ORDER — FUROSEMIDE 20 MG PO TABS: 20.0000 mg | ORAL_TABLET | Freq: Every day | ORAL | 0 refills | Status: DC | PRN

## 2021-01-07 NOTE — Patient Instructions (Signed)
Continue to work on limiting the sodium in your diet. Limit your fried foods. Get compression socks to wear daily (put on first thing in the morning). Elevate your legs when swollen. Use the furosemide once daily if needed for swelling, and eat the bananas on the days you take it. See the cardiologist as planned next week.

## 2021-01-17 NOTE — Progress Notes (Signed)
Office Visit    Patient Name: Erik Leon Date of Encounter: 01/18/2021  PCP:  Rita Ohara, [place]  Cardiologist:  Buford Dresser, MD  Advanced Practice Provider:  No care team member to display Electrophysiologist:  None    Chief Complaint    Erik Leon is a 70 y.o. male with a hx of hypertension, mild HCM, diastolic dysfunction, CAD, HLD, edema presents today for follow up after echocardiogram   Past Medical History    Past Medical History:  Diagnosis Date   Allergy    Atypical mole 11/23/2000   slight to moderate- Right upper back (WS)   Atypical mole 11/23/2000   slight to moderate-right uppermost inner thigh (WS)   Atypical mole 10/24/2009   severe-right mid abdomen (EXC)   Atypical mole 01/06/2019   left mi back    Basal cell carcinoma 09/14/2012   superficial-left upper shoulder (txpbx)   Basal cell carcinoma 11/26/2017   sup + nod-right chest (CX35FU)   Basal cell carcinoma 01/06/2019   sup-right post shoulder (CX35FU)   BCC (basal cell carcinoma of skin) 10/24/2009   top right shoulder (CX35FU)   Calcaneal fracture 2014   right; s/p boot; reinjured in 2019   Colon polyp    Diverticulosis 6/09   rare L colon diverticuli   Elevated PSA 10/2012   benign prostate biopsies 11/2012   Esophageal stricture    s/p dilatation (Dr. Earlean Shawl)   GERD (gastroesophageal reflux disease)    Heart murmur    Hypertension    Hypertr obst cardiomyop    mild--Dr. Radford Pax (previously)   Internal hemorrhoid 6/09   Migraine age 89   resolved after nasal surgery   Nodular basal cell carcinoma ([place]) 11/18/2019   [place] Nose   Past Surgical History:  Procedure Laterality Date   CHOLECYSTECTOMY  5/03   COLONOSCOPY  11/2007, 2011, 2014   Dr. Rudi Rummage again 2019   dental implant     ELBOW SURGERY Right 06/2013   bone spur; Dr. Percell Miller   ESOPHAGOGASTRODUODENOSCOPY  2012   with dilatation.  Dr. Earlean Shawl   LIPOMA EXCISION      Dr. Stanford Breed from L paramedian prevertebral space   NASAL POLYP SURGERY  2008   SHOULDER ARTHROSCOPY  12/03; 1/06   right; left    Allergies  Allergies  Allergen Reactions   Coconut Fatty Acids Swelling    History of Present Illness    Erik Leon is a 70 y.o. male with a hx of hypertension, mild HCM, diastolic dysfunction, CAD, HLD, edema last seen 08/07/20 by Dr. Harrell Gave.  He was seen in 2019 by Dr. Harrell Gave due to chest pain. CT cardiac with coronary calcium score 198 placing him in 65th percentile for age/gender with extensisve plaque in LAD and mild stenosis in distal RCA/PLV. FFR was without significant stenosis.  He sent a MyChart message and was evaluated by primary care due to a possible leg ulcer, lower extremity edema, lethargy. He was started on Furosemide '20mg'$  QD. Subsequent echocardiogram 01/03/21 with LVEF 60-65%, no RWMA, moderate LVh of basal-septal segment, gr1DD, elevated LVEDP, RV normal size and function, trivial MR.  Presents today for follow up. 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  We reviewed echocardiogram in detail. Tells me he is taking Laisx daily in the morning. Notes minimal improvement in his lower extremity edema. Edema began about two months ago. Notes dyspnea with walking up stairs. No  orthopnea, PND. He does drink twelve 16 oz bottles of water per day. Follows a low salt diet. No formal exercise routine.   EKGs/Labs/Other Studies Reviewed:   The following studies were reviewed today:  Echo 2013: (pdf scan)--noted as stress, but only resting echo in note EF >70%, mild cLVH. +SAM, trace MR   Echo 2017: Study Conclusions  - Left ventricle: The cavity size was normal. Wall thickness was   increased in a pattern of mild LVH. Systolic function was normal.   The estimated ejection fraction was in the range of 60% to 65%.   Wall motion was normal; there were no regional wall motion    abnormalities. Doppler parameters are consistent with abnormal   left ventricular relaxation (grade 1 diastolic dysfunction). - Aortic valve: There was no stenosis. - Mitral valve: There was trivial regurgitation. - Right ventricle: The cavity size was normal. Systolic function   was normal. - Pulmonary arteries: No complete TR doppler jet so unable to   estimate PA systolic pressure. - Inferior vena cava: The vessel was normal in size. The   respirophasic diameter changes were in the normal range (>= 50%),   consistent with normal central venous pressure.   Impressions:   - Normal LV size with mild LV hypertrophy. EF 60-65%. Normal RV   size and systolic function. No significant valvular   abnormalities.   CT cardiac 03/03/2018 FINDINGS: The pulmonary veins drained normally to the left atrium. Calcium Score: 198 Agatston units Coronary Arteries: Right dominant with no anomalies LM: Short, no plaque or stenosis. LAD system: Relatively small caliber vessel. Mixed plaque proximal and mid LAD, appears to be mild stenosis. Circumflex system: Large LCx. Mixed plaque mid vessel without significant stenosis. RCA system: Calcified plaque distal RCA/PLV with mild stenosis.   IMPRESSION: 1. Coronary artery calcium score 198 Agatston units. This places the patient in the 65th percentile for age and gender, suggesting intermediate risk for future cardiac events. 2. Extensive plaque in the proximal and mid LAD. Appears to be mild stenosis. 3.  Mild stenosis in distal RCA/PLV.   FFR without hemodynamically significant stenosis  Echo 01/03/21 1. Left ventricular ejection fraction, by estimation, is 60 to 65%. The  left ventricle has normal function. The left ventricle has no regional  wall motion abnormalities. There is moderate left ventricular hypertrophy  of the basal-septal segment. Left  ventricular diastolic parameters are consistent with Grade I diastolic  dysfunction (impaired  relaxation). Elevated left ventricular end-diastolic  pressure.   2. Right ventricular systolic function is normal. The right ventricular  size is normal.   3. The mitral valve is normal in structure. Trivial mitral valve  regurgitation. No evidence of mitral stenosis.   4. The aortic valve is normal in structure. Aortic valve regurgitation is  not visualized. No aortic stenosis is present.   5. The inferior vena cava is normal in size with greater than 50%  respiratory variability, suggesting right atrial pressure of 3 mmHg.   EKG:  EKG is  ordered today.  The ekg ordered today demonstrates NSR 71 bpm with stable TWI lead I and no acute ST/T wave changes  Recent Labs: 12/31/2020: ALT 22; BNP 11.2; Hemoglobin 13.6; Platelets 185; TSH 0.731 01/07/2021: BUN 16; Creatinine, Ser 1.16; Potassium 4.9; Sodium 140  Recent Lipid Panel    Component Value Date/Time   CHOL 140 08/02/2020 1643   TRIG 71 08/02/2020 1643   HDL 51 08/02/2020 1643   CHOLHDL 2.7 08/02/2020 1643  CHOLHDL 4.0 02/21/2016 1035   VLDL 22 02/21/2016 1035   LDLCALC 75 08/02/2020 1643   Home Medications   Current Meds  Medication Sig   aspirin 81 MG tablet Take 81 mg by mouth daily.   atorvastatin (LIPITOR) 40 MG tablet TAKE 1 TABLET(40 MG) BY MOUTH DAILY AT 6 PM   cholecalciferol (VITAMIN D) 1000 units tablet Take 1,000 Units by mouth daily.   esomeprazole (NEXIUM) 40 MG capsule Take 40 mg by mouth 2 (two) times daily before a meal.   fexofenadine (ALLEGRA) 180 MG tablet Take 180 mg by mouth daily.   furosemide (LASIX) 40 MG tablet Take 1 tablet (40 mg total) by mouth daily.   mupirocin ointment (BACTROBAN) 2 % Apply 1 application topically 3 (three) times daily.   valsartan (DIOVAN) 80 MG tablet Take 1 tablet (80 mg total) by mouth daily.   [DISCONTINUED] furosemide (LASIX) 20 MG tablet Take 1 tablet (20 mg total) by mouth daily as needed for fluid or edema.     Review of Systems      All other systems reviewed  and are otherwise negative except as noted above.  Physical Exam    VS:  BP 138/86   Pulse 70   Ht '5\' 10"'$  (1.778 m)   Wt 245 lb 9.6 oz (111.4 kg)   SpO2 96%   BMI 35.24 kg/m  , BMI Body mass index is 35.24 kg/m.  Wt Readings from Last 3 Encounters:  01/18/21 245 lb 9.6 oz (111.4 kg)  01/07/21 246 lb (111.6 kg)  12/31/20 248 lb 3.2 oz (112.6 kg)     GEN: Well nourished, well developed, in no acute distress. HEENT: normal. Neck: Supple, no JVD, carotid bruits, or masses. Cardiac: RRR, no murmurs, rubs, or gallops. No clubbing, cyanosis. Bilateral LE with 2+ pretibial edema. Radials/PT 2+ and equal bilaterally.  Respiratory:  Respirations regular and unlabored, clear to auscultation bilaterally. GI: Soft, nontender, nondistended. MS: No deformity or atrophy. Skin: Warm and dry, no rash. Neuro:  Strength and sensation are intact. Psych: Normal affect.  Assessment & Plan    Nonobstructive CAD - Stable with no anginal symptoms. No indication for ischemic evaluation.  GDMT includes aspirin, atorvastatin. Heart healthy diet and regular cardiovascular exercise encouraged.    LE edema - Bilateral pretibial 2+ pitting edema. Minimal improvement with Lasix '20mg'$  QD. Increase Lasix to '40mg'$  QD, BMP In 1 week. Likely multifactorial diastolic dysfunction, venous insufficiency. Encouraged to reduce fluid intake, adhere to low salt diet. Leg elevation and compression socks encouraged. If edema not improved, consider referral to vascular.   HCM - Echo 01/03/21 with moderate LV hypertrophy of the basal-septal segment. Continue optimal blood pressure control.   HTN - BP mildly elevated in clinic but well controlled by home monitoring with average <122/70. Continue current antihypertensive regimen.   HLD, LDL goal <70 - 08/02/20 LDL 75. Lipid lowering diet and regular cardiovascular exercise encouraged. Continue Atorvastatin '40mg'$  QD. Consider further escalation of Atorvastatin at follow up.    Disposition: Follow up in 3 month(s) with Dr. Harrell Gave or APP.  Signed, Loel Dubonnet, NP 01/18/2021, 7:55 PM [facility]

## 2021-01-18 ENCOUNTER — Encounter (HOSPITAL_BASED_OUTPATIENT_CLINIC_OR_DEPARTMENT_OTHER): Payer: Self-pay | Admitting: Family

## 2021-01-18 ENCOUNTER — Other Ambulatory Visit: Payer: Self-pay

## 2021-01-18 ENCOUNTER — Ambulatory Visit (INDEPENDENT_AMBULATORY_CARE_PROVIDER_SITE_OTHER): Payer: Medicare Other | Admitting: Family

## 2021-01-18 VITALS — BP 138/86 | HR 70 | Ht 70.0 in | Wt 245.6 lb

## 2021-01-18 DIAGNOSIS — I872 Venous insufficiency (chronic) (peripheral): Secondary | ICD-10-CM | POA: Diagnosis not present

## 2021-01-18 DIAGNOSIS — R600 Localized edema: Secondary | ICD-10-CM | POA: Diagnosis not present

## 2021-01-18 DIAGNOSIS — I422 Other hypertrophic cardiomyopathy: Secondary | ICD-10-CM

## 2021-01-18 DIAGNOSIS — I2510 Atherosclerotic heart disease of native coronary artery without angina pectoris: Secondary | ICD-10-CM | POA: Diagnosis not present

## 2021-01-18 DIAGNOSIS — I10 Essential (primary) hypertension: Secondary | ICD-10-CM

## 2021-01-18 MED ORDER — FUROSEMIDE 40 MG PO TABS: 40.0000 mg | ORAL_TABLET | Freq: Every day | ORAL | 5 refills | Status: DC

## 2021-01-18 NOTE — Patient Instructions (Addendum)
Medication Instructions:  Your physician has recommended you make the following change in your medication:   CHANGE Furosemide to '40mg'$  daily   *If you need a refill on your cardiac medications before your next appointment, please call your pharmacy*   Lab Work: Your physician recommends that you return for lab work in 1 week for BMP at [facility]  If you have labs (blood work) drawn today and your tests are completely normal, you will receive your results only by: [place] (if you have MyChart) OR A paper copy in the mail If you have any lab test that is abnormal or we need to change your treatment, we will call you to review the results.   Testing/Procedures: Your EKG today showed normal sinus rhythm   Your echocardiogram showed your heart pumping function was normal. Your heart was mildly stiff and showed your heart muscle was moderately thick. We prevent this from worsening by keeping your blood pressure well controlled.  Follow-Up: At [facility], you and your health needs are our priority.  As part of our continuing mission to provide you with exceptional heart care, we have created designated Provider Care Teams.  These Care Teams include your primary Cardiologist (physician) and Advanced Practice Providers (APPs -  Physician Assistants and Nurse Practitioners) who all work together to provide you with the care you need, when you need it.  We recommend signing up for the patient portal called "MyChart".  Sign up information is provided on this After Visit Summary.  MyChart is used to connect with patients for Virtual Visits (Telemedicine).  Patients are able to view lab/test results, encounter notes, upcoming appointments, etc.  Non-urgent messages can be sent to your provider as well.   To learn more about what you can do with MyChart, go to [email or web address].    Your next appointment:   In October as scheduled  Other Instructions  To prevent or reduce lower  extremity swelling: Eat a low salt diet. Salt makes the body hold onto extra fluid which causes swelling. Sit with legs elevated. For example, in the recliner or on an [place].  Wear knee-high compression stockings during the daytime. Ones labeled 15-20 mmHg provide good compression.  Chronic Venous Insufficiency Chronic venous insufficiency is a condition where the leg veins cannot effectively pump blood from the legs to the heart. This happens when the vein walls are either stretched, weakened, or damaged, or when the valves inside the vein are damaged. With the right treatment, you should be able to continue withan active life. This condition is also called venous stasis. What are the causes? Common causes of this condition include: High blood pressure inside the veins (venous hypertension). Sitting or standing too long, causing increased blood pressure in the leg veins. A blood clot that blocks blood flow in a vein (deep vein thrombosis, DVT). Inflammation of a vein (phlebitis) that causes a blood clot to form. Tumors in the pelvis that cause blood to back up. What increases the risk? The following factors may make you more likely to develop this condition: Having a family history of this condition. Obesity. Pregnancy. Living without enough regular physical activity or exercise (sedentary lifestyle). Smoking. Having a job that requires long periods of standing or sitting in one place. Being a certain age. Women in their 27s and 63s and men in their 44s are more likely to develop this condition. What are the signs or symptoms? Symptoms of this condition include: Veins that are enlarged, bulging, or twisted (  varicose veins). Skin breakdown or ulcers. Reddened skin or dark discoloration of skin on the leg between the knee and ankle. Brown, smooth, tight, and painful skin just above the ankle, usually on the inside of the leg (lipodermatosclerosis). Swelling of the legs. How is this  diagnosed? This condition may be diagnosed based on: Your medical history. A physical exam. Tests, such as: A procedure that creates an image of a blood vessel and nearby organs and provides information about blood flow through the blood vessel (duplex ultrasound). A procedure that tests blood flow (plethysmography). A procedure that looks at the veins using X-ray and dye (venogram). How is this treated? The goals of treatment are to help you return to an active life and to minimize pain or disability. Treatment depends on the severity of your condition, and it may include: Wearing compression stockings. These can help relieve symptoms and help prevent your condition from getting worse. However, they do not cure the condition. Sclerotherapy. This procedure involves an injection of a solution that shrinks damaged veins. Surgery. This may involve: Removing a diseased vein (vein stripping). Cutting off blood flow through the vein (laser ablation surgery). Repairing or reconstructing a valve within the affected vein. Follow these instructions at home:     Wear compression stockings as told by your health care provider. These stockings help to prevent blood clots and reduce swelling in your legs. Take over-the-counter and prescription medicines only as told by your health care provider. Stay active by exercising, walking, or doing different activities. Ask your health care provider what activities are safe for you and how much exercise you need. Drink enough fluid to keep your urine pale yellow. Do not use any products that contain nicotine or tobacco, such as cigarettes, e-cigarettes, and chewing tobacco. If you need help quitting, ask your health care provider. Keep all follow-up visits as told by your health care provider. This is important. Contact a health care provider if you: Have redness, swelling, or more pain in the affected area. See a red streak or line that goes up or down from  the affected area. Have skin breakdown or skin loss in the affected area, even if the breakdown is small. Get an injury in the affected area. Get help right away if: You get an injury and an open wound in the affected area. You have: Severe pain that does not get better with medicine. Sudden numbness or weakness in the foot or ankle below the affected area. Trouble moving your foot or ankle. A fever. Worse or persistent symptoms. Chest pain. Shortness of breath. Summary Chronic venous insufficiency is a condition where the leg veins cannot effectively pump blood from the legs to the heart. Chronic venous insufficiency occurs when the vein walls become stretched, weakened, or damaged, or when valves within the vein are damaged. Treatment depends on how severe your condition is. It often involves wearing compression stockings and may involve having a procedure. Make sure you stay active by exercising, walking, or doing different activities. Ask your health care provider what activities are safe for you and how much exercise you need. This information is not intended to replace advice given to you by your health care provider. Make sure you discuss any questions you have with your healthcare provider. Document Revised: 03/02/2018 Document Reviewed: 03/02/2018 Elsevier Patient Education  [place].

## 2021-01-21 ENCOUNTER — Encounter: Payer: Self-pay | Admitting: Family Medicine

## 2021-01-21 DIAGNOSIS — Z85828 Personal history of other malignant neoplasm of skin: Secondary | ICD-10-CM | POA: Insufficient documentation

## 2021-01-21 DIAGNOSIS — Z79899 Other long term (current) drug therapy: Secondary | ICD-10-CM | POA: Insufficient documentation

## 2021-01-21 DIAGNOSIS — U071 COVID-19: Secondary | ICD-10-CM | POA: Insufficient documentation

## 2021-01-21 DIAGNOSIS — R079 Chest pain, unspecified: Secondary | ICD-10-CM | POA: Diagnosis not present

## 2021-01-21 DIAGNOSIS — R000 Tachycardia, unspecified: Secondary | ICD-10-CM | POA: Diagnosis not present

## 2021-01-21 DIAGNOSIS — R0789 Other chest pain: Secondary | ICD-10-CM | POA: Diagnosis not present

## 2021-01-21 DIAGNOSIS — Z9049 Acquired absence of other specified parts of digestive tract: Secondary | ICD-10-CM | POA: Diagnosis not present

## 2021-01-21 DIAGNOSIS — Z7982 Long term (current) use of aspirin: Secondary | ICD-10-CM | POA: Diagnosis not present

## 2021-01-21 DIAGNOSIS — R55 Syncope and collapse: Secondary | ICD-10-CM | POA: Insufficient documentation

## 2021-01-21 DIAGNOSIS — I2510 Atherosclerotic heart disease of native coronary artery without angina pectoris: Secondary | ICD-10-CM | POA: Diagnosis not present

## 2021-01-21 DIAGNOSIS — R0602 Shortness of breath: Secondary | ICD-10-CM | POA: Diagnosis not present

## 2021-01-21 DIAGNOSIS — Z87891 Personal history of nicotine dependence: Secondary | ICD-10-CM | POA: Diagnosis not present

## 2021-01-21 DIAGNOSIS — I10 Essential (primary) hypertension: Secondary | ICD-10-CM | POA: Diagnosis not present

## 2021-01-21 DIAGNOSIS — J9811 Atelectasis: Secondary | ICD-10-CM | POA: Diagnosis not present

## 2021-01-22 ENCOUNTER — Emergency Department (HOSPITAL_COMMUNITY): Payer: Medicare Other

## 2021-01-22 ENCOUNTER — Emergency Department (HOSPITAL_COMMUNITY)
Admission: EM | Admit: 2021-01-22 | Discharge: 2021-01-22 | Disposition: A | Discharge status: Home / Self Care | Payer: Medicare Other | Attending: Emergency Medicine | Admitting: Emergency Medicine

## 2021-01-22 DIAGNOSIS — Z789 Other specified health status: Secondary | ICD-10-CM

## 2021-01-22 DIAGNOSIS — R0789 Other chest pain: Secondary | ICD-10-CM | POA: Diagnosis not present

## 2021-01-22 DIAGNOSIS — J9811 Atelectasis: Secondary | ICD-10-CM | POA: Diagnosis not present

## 2021-01-22 DIAGNOSIS — R0602 Shortness of breath: Secondary | ICD-10-CM | POA: Diagnosis not present

## 2021-01-22 DIAGNOSIS — U071 COVID-19: Secondary | ICD-10-CM

## 2021-01-22 DIAGNOSIS — R55 Syncope and collapse: Secondary | ICD-10-CM | POA: Diagnosis not present

## 2021-01-22 DIAGNOSIS — R079 Chest pain, unspecified: Secondary | ICD-10-CM | POA: Diagnosis not present

## 2021-01-22 DIAGNOSIS — R000 Tachycardia, unspecified: Secondary | ICD-10-CM | POA: Diagnosis not present

## 2021-01-22 DIAGNOSIS — Z9049 Acquired absence of other specified parts of digestive tract: Secondary | ICD-10-CM | POA: Diagnosis not present

## 2021-01-22 LAB — COMPREHENSIVE METABOLIC PANEL
ALT: 28 U/L (ref 0–44)
AST: 26 U/L (ref 15–41)
Albumin: 3.8 g/dL (ref 3.5–5.0)
Alkaline Phosphatase: 70 U/L (ref 38–126)
Anion gap: 9 (ref 5–15)
BUN: 14 mg/dL (ref 8–23)
CO2: 24 mmol/L (ref 22–32)
Calcium: 8.9 mg/dL (ref 8.9–10.3)
Chloride: 101 mmol/L (ref 98–111)
Creatinine, Ser: 1.25 mg/dL — ABNORMAL HIGH (ref 0.61–1.24)
GFR, Estimated: >60 mL/min (ref >60)
Glucose, Bld: 111 mg/dL — ABNORMAL HIGH (ref 70–99)
Potassium: 3.6 mmol/L (ref 3.5–5.1)
Sodium: 134 mmol/L — ABNORMAL LOW (ref 135–145)
Total Bilirubin: 1.1 mg/dL (ref 0.3–1.2)
Total Protein: 6.8 g/dL (ref 6.5–8.1)

## 2021-01-22 LAB — LACTIC ACID, PLASMA: Lactic Acid, Venous: 1.1 mmol/L (ref 0.5–1.9)

## 2021-01-22 LAB — CBC WITH DIFFERENTIAL/PLATELET
Abs Immature Granulocytes: 0.03 10*3/uL (ref 0.00–0.07)
Basophils Absolute: 0 10*3/uL (ref 0.0–0.1)
Basophils Relative: 1 %
Eosinophils Absolute: 0.1 10*3/uL (ref 0.0–0.5)
Eosinophils Relative: 2 %
HCT: 42.3 % (ref 39.0–52.0)
Hemoglobin: 13.9 g/dL (ref 13.0–17.0)
Immature Granulocytes: 1 %
Lymphocytes Relative: 11 %
Lymphs Abs: 0.6 10*3/uL — ABNORMAL LOW (ref 0.7–4.0)
MCH: 26.6 pg (ref 26.0–34.0)
MCHC: 32.9 g/dL (ref 30.0–36.0)
MCV: 81 fL (ref 80.0–100.0)
Monocytes Absolute: 0.9 10*3/uL (ref 0.1–1.0)
Monocytes Relative: 16 %
Neutro Abs: 3.8 10*3/uL (ref 1.7–7.7)
Neutrophils Relative %: 69 %
Platelets: 164 10*3/uL (ref 150–400)
RBC: 5.22 MIL/uL (ref 4.22–5.81)
RDW: 12.7 % (ref 11.5–15.5)
WBC: 5.5 10*3/uL (ref 4.0–10.5)
nRBC: 0 % (ref 0.0–0.2)

## 2021-01-22 LAB — D-DIMER, QUANTITATIVE: D-Dimer, Quant: 0.49 ug/mL-FEU (ref 0.00–0.50)

## 2021-01-22 LAB — PROCALCITONIN: Procalcitonin: <0.1 ng/mL

## 2021-01-22 LAB — LACTATE DEHYDROGENASE: LDH: 146 U/L (ref 98–192)

## 2021-01-22 LAB — FERRITIN: Ferritin: 54 ng/mL (ref 24–336)

## 2021-01-22 LAB — FIBRINOGEN: Fibrinogen: 408 mg/dL (ref 210–475)

## 2021-01-22 LAB — TROPONIN I (HIGH SENSITIVITY)
Troponin I (High Sensitivity): 18 ng/L — ABNORMAL HIGH (ref <18)
Troponin I (High Sensitivity): 24 ng/L — ABNORMAL HIGH (ref <18)

## 2021-01-22 LAB — C-REACTIVE PROTEIN: CRP: 0.9 mg/dL (ref <1.0)

## 2021-01-22 LAB — BRAIN NATRIURETIC PEPTIDE: B Natriuretic Peptide: 23.7 pg/mL (ref 0.0–100.0)

## 2021-01-22 LAB — TRIGLYCERIDES: Triglycerides: 86 mg/dL (ref <150)

## 2021-01-22 MED ORDER — ACETAMINOPHEN 500 MG PO TABS
1000.0000 mg | ORAL_TABLET | Freq: Once | ORAL | Status: AC
  Administered 2021-01-22: 1000 mg via ORAL
  Filled 2021-01-22: qty 2

## 2021-01-22 MED ORDER — METOCLOPRAMIDE HCL 5 MG/ML IJ SOLN
10.0000 mg | Freq: Once | INTRAMUSCULAR | Status: AC
  Administered 2021-01-22: 10 mg via INTRAVENOUS
  Filled 2021-01-22: qty 2

## 2021-01-22 MED ORDER — KETOROLAC TROMETHAMINE 30 MG/ML IJ SOLN
30.0000 mg | Freq: Once | INTRAMUSCULAR | Status: AC
  Administered 2021-01-22: 30 mg via INTRAVENOUS
  Filled 2021-01-22: qty 1

## 2021-01-22 MED ORDER — IOHEXOL 350 MG/ML SOLN
100.0000 mL | Freq: Once | INTRAVENOUS | Status: AC | PRN
  Administered 2021-01-22: 49 mL via INTRAVENOUS

## 2021-01-22 MED ORDER — SODIUM CHLORIDE 0.9 % IV BOLUS
1000.0000 mL | Freq: Once | INTRAVENOUS | Status: AC
  Administered 2021-01-22: 1000 mL via INTRAVENOUS

## 2021-01-22 MED ORDER — DIPHENHYDRAMINE HCL 50 MG/ML IJ SOLN
12.5000 mg | Freq: Once | INTRAMUSCULAR | Status: AC
  Administered 2021-01-22: 12.5 mg via INTRAVENOUS
  Filled 2021-01-22: qty 1

## 2021-01-22 MED ORDER — NIRMATRELVIR/RITONAVIR (PAXLOVID)TABLET
3.0000 | ORAL_TABLET | Freq: Two times a day (BID) | ORAL | Status: DC
  Administered 2021-01-22: 3 via ORAL
  Filled 2021-01-22: qty 30

## 2021-01-22 MED ORDER — KETOROLAC TROMETHAMINE 30 MG/ML IJ SOLN: 30.0000 mg | Freq: Once | INTRAMUSCULAR | Status: DC

## 2021-01-22 MED ORDER — IRBESARTAN 75 MG PO TABS
75.0000 mg | ORAL_TABLET | Freq: Every day | ORAL | Status: DC
  Administered 2021-01-22: 75 mg via ORAL
  Filled 2021-01-22: qty 1

## 2021-01-22 NOTE — Discharge Instructions (Addendum)
You were diagnosed with Covid today, and started on paxlovid.  You got your first dose of paxlovid today in the ER.  You will take only FOUR (4) more days of this medicine at home, starting tomorrow morning on 01/23/21.  While taking Paxlovid, you will need to HOLD your atorvastatin (lipitor) at home.  Do NOT take lipitor for the next 5 days.  Afterwards you can resume it as normal.  Otherwise your work-up today did not show evidence of blood clot or pneumonia on your lungs on your CT scan.  We had the cardiologist see you because your troponin or heart enzyme levels are mildly elevated.  This is likely related to stress on your heart from your viral illness.  Most people are symptomatic with this covid strain for 5-7 days.  However you should quarantine at home for 10 days from the onset of your symptoms, to reduce your chance of infecting others.  *  Keep monitoring your heart rate and your oxygen level at home.  If your heart rate goes over 120 beats per minute at rest, or your oxygen level drops under 88% at rest, you need to come back to the ER.  Likewise, if you begin experiencing chest pain or pressure, have difficulty breathing, or feel lightheaded or like passing out, you should come back to the ER.

## 2021-01-22 NOTE — ED Notes (Signed)
Pt IV removed and d/c papers reviewed. MD asked this RN to d/c for another RN. PT dressed and taken out by NT in w/c to car where he had a ride pick him up

## 2021-01-22 NOTE — ED Triage Notes (Signed)
Pt c/o covid symptoms, tested positive today using home test. Endorses CP w cough, HA, fever controlled w tylenol. Pt advises he had syncopal episode approx 2230 after coughing fit, states vision went black. Received J&J vaccine in Sept.

## 2021-01-22 NOTE — ED Provider Notes (Signed)
[facility]ERGENCY DEPARTMENT Provider Note   CSN: [id number] Arrival date & time: 01/21/21  2328     History Chief Complaint  Patient presents with   Covid Positive   Shortness of Breath   Cough   Chest Pain    Erik Leon is a 70 y.o. male with a history of hypertension, CAD, high cholesterol, mild HCM, presenting to emergency department with an episode of syncope and testing positive for COVID at home.  The patient reports that he has had 1 dose of the [employer] vaccine.  He reports that he tested positive on a home test with COVID yesterday (provided the test at triage).  He states he has had 24 hours of fevers, chills, headache, dry cough.  He denies chest pain or pressure.  He reports that during a heavy coughing fit last night, after about 5 minutes of persistent coughing, became lightheaded and had loss of consciousness for approximately 3 to 5 minutes (not sure total time) falling onto his couch.  When he woke up he called a neighbor brought him into the ED.  He denies any prior history of syncope.  He denies any history of MI.  He states "I feel like I was just hyperventilating when I was coughing".  He states at baseline his resting HR is 60 bpm.  He reports he is not a history of recurring pneumonia, and states that he had pneumonia "5 times this year".  No hemoptysis or asymmetric LE edema. Patient denies personal or family history of DVT or PE. No recent hormone use (including OCP); travel for >6 hours; prolonged immobilization for greater than 3 days; surgeries or trauma in the last 4 weeks; or malignancy with treatment within 6 months.   IMPRESSIONS    1. Left ventricular ejection fraction, by estimation, is 60 to 65%. The  left ventricle has normal function. The left ventricle has no regional  wall motion abnormalities. There is moderate left ventricular hypertrophy  of the basal-septal segment. Left  ventricular diastolic parameters are  consistent with Grade I diastolic  dysfunction (impaired relaxation). Elevated left ventricular end-diastolic  pressure.   2. Right ventricular systolic function is normal. The right ventricular  size is normal.   3. The mitral valve is normal in structure. Trivial mitral valve  regurgitation. No evidence of mitral stenosis.   4. The aortic valve is normal in structure. Aortic valve regurgitation is  not visualized. No aortic stenosis is present.   5. The inferior vena cava is normal in size with greater than 50%  respiratory variability, suggesting right atrial pressure of 3 mmHg.   CT cardiac 03/03/2018 FINDINGS: The pulmonary veins drained normally to the left atrium. Calcium Score: 198 Agatston units Coronary Arteries: Right dominant with no anomalies LM: Short, no plaque or stenosis. LAD system: Relatively small caliber vessel. Mixed plaque proximal and mid LAD, appears to be mild stenosis. Circumflex system: Large LCx. Mixed plaque mid vessel without significant stenosis. RCA system: Calcified plaque distal RCA/PLV with mild stenosis.   IMPRESSION: 1. Coronary artery calcium score 198 Agatston units. This places the patient in the 65th percentile for age and gender, suggesting intermediate risk for future cardiac events. 2. Extensive plaque in the proximal and mid LAD. Appears to be mild stenosis. 3.  Mild stenosis in distal RCA/PLV.   FFR without hemodynamically significant stenosis  HPI     Past Medical History:  Diagnosis Date   Allergy    Atypical mole  11/23/2000   slight to moderate- Right upper back (WS)   Atypical mole 11/23/2000   slight to moderate-right uppermost inner thigh (WS)   Atypical mole 10/24/2009   severe-right mid abdomen (EXC)   Atypical mole 01/06/2019   left mi back    Basal cell carcinoma 09/14/2012   superficial-left upper shoulder (txpbx)   Basal cell carcinoma 11/26/2017   sup + nod-right chest (CX35FU)   Basal cell carcinoma  01/06/2019   sup-right post shoulder (CX35FU)   BCC (basal cell carcinoma of skin) 10/24/2009   top right shoulder (CX35FU)   Calcaneal fracture 2014   right; s/p boot; reinjured in 2019   Colon polyp    Diverticulosis 6/09   rare L colon diverticuli   Elevated PSA 10/2012   benign prostate biopsies 11/2012   Esophageal stricture    s/p dilatation (Dr. Earlean Shawl)   GERD (gastroesophageal reflux disease)    Heart murmur    Hypertension    Hypertr obst cardiomyop    mild--Dr. Radford Pax (previously)   Internal hemorrhoid 6/09   Migraine age 89   resolved after nasal surgery   Nodular basal cell carcinoma ([place]) 11/18/2019   [place] Nose    Patient Active Problem List   Diagnosis Date Noted   Pure hypercholesterolemia 04/11/2019   Heart palpitations 04/11/2019   Nonocclusive coronary atherosclerosis of native coronary artery 07/13/2018   Elevated coronary artery calcium score 07/13/2018   Hypertrophic cardiomyopathy ([place]) February 12, 2018   Family history of sudden cardiac death in brother 02-12-2018   Vitamin D deficiency 07/14/2017   Obesity (BMI 30-39.9) 04/20/2014   Elevated PSA 11/03/2012   Essential hypertension 10/22/2011   GERD (gastroesophageal reflux disease) 10/22/2011    Past Surgical History:  Procedure Laterality Date   CHOLECYSTECTOMY  5/03   COLONOSCOPY  11/2007, 2011, 2014   Dr. Rudi Rummage again 2019   dental implant     ELBOW SURGERY Right 06/2013   bone spur; Dr. Percell Miller   ESOPHAGOGASTRODUODENOSCOPY  2012   with dilatation.  Dr. Earlean Shawl   LIPOMA EXCISION     Dr. Stanford Breed from L paramedian prevertebral space   NASAL POLYP SURGERY  2008   SHOULDER ARTHROSCOPY  12/03; 1/06   right; left       Family History  Problem Relation Age of Onset   Hypertension Mother    Dementia Mother    Cancer Mother        bladder, metastatic   Alcohol abuse Father    Alcohol abuse Brother    Cirrhosis Brother    Liver cancer Brother    Diabetes Neg Hx    Prostate  cancer Neg Hx    Colon cancer Neg Hx     Social History   Tobacco Use   Smoking status: Former    Types: Cigarettes    Quit date: 06/24/1983    Years since quitting: 37.6   Smokeless tobacco: Never  Vaping Use   Vaping Use: Never used  Substance Use Topics   Alcohol use: Yes    Comment: 1-2 drink/month (scotch) or less   Drug use: No    Home Medications Prior to Admission medications   Medication Sig Start Date End Date Taking? Authorizing Provider  aspirin 81 MG tablet Take 81 mg by mouth daily.   Yes [provider]  atorvastatin (LIPITOR) 40 MG tablet TAKE 1 TABLET(40 MG) BY MOUTH DAILY AT 6 PM 04/17/20  Yes Buford Dresser, MD  cholecalciferol (VITAMIN D) 1000 units tablet Take 1,000  Units by mouth daily.   Yes [provider]  esomeprazole (NEXIUM) 40 MG capsule Take 40 mg by mouth 2 (two) times daily before a meal.   Yes [provider]  fexofenadine (ALLEGRA) 180 MG tablet Take 180 mg by mouth daily.   Yes [provider]  furosemide (LASIX) 40 MG tablet Take 1 tablet (40 mg total) by mouth daily. 01/18/21 07/17/21 Yes Loel Dubonnet, NP  mupirocin ointment (BACTROBAN) 2 % Apply 1 application topically 3 (three) times daily. 12/31/20  Yes Rita Ohara, MD  valsartan (DIOVAN) 80 MG tablet Take 1 tablet (80 mg total) by mouth daily. 08/07/20  Yes Buford Dresser, MD    Allergies    Coconut fatty acids  Review of Systems   Review of Systems  Constitutional:  Positive for chills and fever.  HENT:  Negative for ear pain and sore throat.   Eyes:  Negative for pain and visual disturbance.  Respiratory:  Positive for cough. Negative for shortness of breath.   Cardiovascular:  Negative for chest pain and palpitations.  Gastrointestinal:  Negative for abdominal pain and vomiting.  Genitourinary:  Negative for dysuria and hematuria.  Musculoskeletal:  Positive for arthralgias and myalgias.  Skin:  Negative for color change and  rash.  Neurological:  Positive for syncope and headaches. Negative for speech difficulty, weakness and numbness.  All other systems reviewed and are negative.  Physical Exam Updated Vital Signs BP 136/65   Pulse 80   Temp 100.2 F (37.9 C)   Resp (!) 25   SpO2 96%   Physical Exam Constitutional:      General: He is not in acute distress. HENT:     Head: Normocephalic and atraumatic.  Eyes:     Conjunctiva/sclera: Conjunctivae normal.     Pupils: Pupils are equal, round, and reactive to light.  Cardiovascular:     Rate and Rhythm: Regular rhythm. Tachycardia present.  Pulmonary:     Effort: Pulmonary effort is normal. No respiratory distress.  Abdominal:     General: There is no distension.     Tenderness: There is no abdominal tenderness.  Skin:    General: Skin is warm and dry.  Neurological:     General: No focal deficit present.     Mental Status: He is alert and oriented to person, place, and time. Mental status is at baseline.     Cranial Nerves: No cranial nerve deficit.     Motor: No weakness.  Psychiatric:        Mood and Affect: Mood normal.        Behavior: Behavior normal.    ED Results / Procedures / Treatments   Labs (all labs ordered are listed, but only abnormal results are displayed) Labs Reviewed  CBC WITH DIFFERENTIAL/PLATELET - Abnormal; Notable for the following components:      Result Value   Lymphs Abs 0.6 (*)    All other components within normal limits  COMPREHENSIVE METABOLIC PANEL - Abnormal; Notable for the following components:   Sodium 134 (*)    Glucose, Bld 111 (*)    Creatinine, Ser 1.25 (*)    All other components within normal limits  TROPONIN I (HIGH SENSITIVITY) - Abnormal; Notable for the following components:   Troponin I (High Sensitivity) 18 (*)    All other components within normal limits  TROPONIN I (HIGH SENSITIVITY) - Abnormal; Notable for the following components:   Troponin I (High Sensitivity) 24 (*)    All other  components within normal limits  CULTURE, BLOOD (ROUTINE X 2)  CULTURE, BLOOD (ROUTINE X 2)  LACTIC ACID, PLASMA  D-DIMER, QUANTITATIVE  PROCALCITONIN  LACTATE DEHYDROGENASE  FERRITIN  TRIGLYCERIDES  FIBRINOGEN  C-REACTIVE PROTEIN  BRAIN NATRIURETIC PEPTIDE    EKG EKG Interpretation  Date/Time:  Tuesday January 22 2021 09:34:49 EDT Ventricular Rate:  102 PR Interval:  159 QRS Duration: 104 QT Interval:  359 QTC Calculation: 468 R Axis:   8 Text Interpretation: Sinus tachycardia Borderline repolarization abnormality Confirmed by Octaviano Glow [phone]) on 01/22/2021 9:40:04 AM  Radiology CT Angio Chest PE W and/or Wo Contrast  Result Date: 01/22/2021 CLINICAL DATA:  70 year old male with syncope, COVID-19, shortness of breath. EXAM: CT ANGIOGRAPHY CHEST WITH CONTRAST TECHNIQUE: Multidetector CT imaging of the chest was performed using the standard protocol during bolus administration of intravenous contrast. Multiplanar CT image reconstructions and MIPs were obtained to evaluate the vascular anatomy. CONTRAST:  Forty-nine mL Omnipaque 350, intravenous COMPARISON:  Coronary chest CT from 03/03/2018 FINDINGS: Cardiovascular: Satisfactory opacification of the pulmonary arteries to the segmental level. No evidence of pulmonary embolism. Normal heart size. No pericardial effusion. Coronary atherosclerotic calcifications. Mild scattered thoracic aortic atherosclerotic calcifications. Mediastinum/Nodes: Left hilar and left AP window calcified lymph nodes. No pathologically enlarged mediastinal or hilar lymphadenopathy. No axillary or supraclavicular lymphadenopathy. The thyroid gland, trachea, and esophagus are within normal limits. Lungs/Pleura: An azygos fissure is noted. Mild lingular and left lower lobar cicatricial atelectasis, unchanged. Minimal bibasilar subsegmental atelectasis. No focal consolidations. No suspicious pulmonary nodules. No pleural effusion or pneumothorax. Upper Abdomen:  Diffusely decreased attenuation of the hepatic parenchyma. Status post cholecystectomy. The remaining visualized upper abdomen is within normal limits. Musculoskeletal: Mild multilevel degenerative changes of the thoracic spine. No acute osseous abnormality. Review of the MIP images confirms the above findings. IMPRESSION: Vascular: 1. No pulmonary embolism. 2. Coronary and aortic atherosclerosis (ICD10-I70.0). Non-Vascular: 1.   Mild bibasilar subsegmental atelectasis. 2. Similar appearing cicatricial atelectasis in the left lower lobe and lingula. 3.   Evidence of prior granulomatous disease. 4.   Hepatic steatosis. Ruthann Cancer, MD [facility] Electronically Signed   By: Ruthann Cancer MD   On: 01/22/2021 09:52   DG Chest Port 1 View  Result Date: 01/22/2021 CLINICAL DATA:  Chest pain and syncope. EXAM: PORTABLE CHEST 1 VIEW COMPARISON:  None. FINDINGS: Lung volumes are low. Upper normal heart size. Normal mediastinal contours for technique. There is mild central bronchial thickening. Subsegmental atelectasis at the bases. No pleural fluid or pneumothorax. No acute osseous abnormalities are seen. IMPRESSION: Low lung volumes with central bronchial thickening and bibasilar atelectasis. Electronically Signed   By: Keith Rake M.D.   On: 01/22/2021 00:55    Procedures Procedures   Medications Ordered in ED Medications  irbesartan (AVAPRO) tablet 75 mg (75 mg Oral Given 01/22/21 0943)  nirmatrelvir/ritonavir EUA (PAXLOVID) TABS 3 tablet (3 tablets Oral Given 01/22/21 1240)  ketorolac (TORADOL) 30 MG/ML injection 30 mg (30 mg Intravenous Not Given 01/22/21 1621)  ketorolac (TORADOL) 30 MG/ML injection 30 mg (30 mg Intravenous Given 01/22/21 0932)  diphenhydrAMINE (BENADRYL) injection 12.5 mg (12.5 mg Intravenous Given 01/22/21 0941)  metoCLOPramide (REGLAN) injection 10 mg (10 mg Intravenous Given 01/22/21 0940)  acetaminophen (TYLENOL) tablet 1,000 mg  (1,000 mg Oral Given 01/22/21 0853)  sodium chloride 0.9 % bolus 1,000 mL (0 mLs Intravenous Stopped 01/22/21 1609)  iohexol (OMNIPAQUE) 350 MG/ML injection 100 mL (49 mLs Intravenous Contrast Given 01/22/21 0930)  ED Course  I have reviewed the triage vital signs and the nursing notes.  Pertinent labs & imaging results that were available during my care of the patient were reviewed by me and considered in my medical decision making (see chart for details).  This patient complains of viral type syndrome, headache, and also an episode of syncope yesterday evening.  This involves an extensive number of treatment options, and is a complaint that carries with it a high risk of complications and morbidity.  The differential diagnosis includes COVID viral illness versus PE versus anemia versus dehydration versus other.  Unfortunately the patient did have a prolonged wait in the waiting room last night, approximately 9 hours prior to my evaluation in the morning.  He developed some very minor tachycardia.  This temperature was 99.9.  Blood pressure is mildly elevated.  He has not been hypoxic throughout the evening, and on my evaluation is 95% on room air.  He is not in acute respiratory distress.  This tachycardia may be related to some dehydration or to infection and illness.  Cannot exclude the possibility of pulmonary embolism given that this tachycardia is new and that he had an episode of syncope.  I discussed with the patient and CT PE scan, and I think it is reasonable to proceed at this time.  Ddimer was 0.49.  Troponins 18 -> 24. BNP 23.7.  I ordered, reviewed, and interpreted labs.  Based on his labs, the lower suspicion at this time for sepsis (lactate normal, WBC normal, procalcitonin normal), anemia (hgb 13.9).  K 3.6.  Cr 1.25 (minor increase over past month from 1.08). I ordered medication IV fluids, IV headache meds for headache, fever, and tachycardia I ordered imaging studies which  included dg chest, CT PE I independently visualized and interpreted imaging which showed possible bibasilar atelectasis on x-ray of the chest, CTPE as noted below, and the monitor tracing which showed sinus tachycardia improved to NSR at rest. Previous records obtained and reviewed showing recent echocardiogram as noted above. I  EMERSEN BARCENA was evaluated in Emergency Department on 01/22/2021 for the symptoms described in the history of present illness. He was evaluated in the context of the global COVID-19 pandemic, which necessitated consideration that the patient might be at risk for infection with the SARS-CoV-2 virus that causes COVID-19. Institutional protocols and algorithms that pertain to the evaluation of patients at risk for COVID-19 are in a state of rapid change based on information released by regulatory bodies including the CDC and federal and state organizations. These policies and algorithms were followed during the patient's care in the ED.   Clinical Course as of 01/22/21 1735  Tue Jan 22, 2021  1002 No evidence of acute PE, pericardial effusion, or pneumonia on CT scan per report.  Given minor elevation in troponins, I have paged cardiology. [MT]  1004 Cardiology consult placed [MT]  1024 Holding statin, confirmed with ED pharmacy about paxlovid dosing (normal GFR here > 60) and medication interactions - statin is the only he needs to hold. [MT]  [place] cardiology again, consult pending, I am told they are en route [MT]  1559 Patient evaluated by cardiology Dr. Gwenlyn Found and felt to be reasonably safe from discharge from a cardiac perspective, felt to have a mild elevated troponin due to demand ischemia.  With this in mind the patient can be discharged home. [MT]    Clinical Course User Index [MT] Bence Trapp, Carola Rhine, MD    Final Clinical  Impression(s) / ED Diagnoses Final diagnoses:  COVID-19    Rx / DC Orders ED Discharge Orders     None        Marva Hendryx,  Carola Rhine, MD 01/22/21 1736

## 2021-01-22 NOTE — ED Notes (Signed)
Paxlovid given to patient and informed him that he needs to take it home with him.

## 2021-01-22 NOTE — ED Notes (Addendum)
To CT, unable to start an IV. CT will be starting an IV.

## 2021-01-22 NOTE — Consult Note (Addendum)
Cardiology Consultation:   Patient ID: MUHAMMADYUSUF RAZI MRN: [id number]; DOB: [birth year]  Admit date: 01/22/2021 Date of Consult: 01/22/2021  PCP:  Rita Ohara, MD   [facility] Providers Cardiologist:  Buford Dresser, MD   {    Patient Profile:   CAM SADUSKY is a 70 y.o. male with a hx of non-obstructive CAD, hypertension, mild HCM, HLD, GERD, who is being seen 01/22/2021 for the evaluation of elevated troponin at the request of Dr Langston Masker.  History of Present Illness:   Mr. Presto follows Dr. Harrell Gave outpatient since 02/01/2018.  He complained of chest pain, exertional tachycardia during the previous office visits.  He had multiple work-up in the past with Echo from 2013 revealing EF >70 percent, mild c LVH, + SAM, and trace MR.  CT coronary from 03/03/2018 showed coronary artery calcium score of 198 Agatston units, 65th percentile for age and gender, suggesting intermediate risk for future cardiac events, extensive plaque in the proximal and mid LAD, appears to be mild stenosis, mild stenosis in distal RCA/PLV, FFR without hemodynamically significant disease. He was trialed with imdur which caused headache. For HCM, he is predominantly focal septal hypertrophy, max LVOT gradient noted on imaging was 12 mmHg. Repeat echo in 2017 revealed normal LV size with mild LVH, EF 60 to 65%, grade 1 DD, normal RV size and systolic function, no significant valvular disease. He hs been asymptomatic without syncope. For HTN, he had suffered a cough from lisinopril and was switched valsartan '80mg'$  daily.   He was last seen by APP on 01/18/21 , had a surveillance Echo completed on 01/03/21, EF was 60-65%, no RWMA, moderate LVH of the basal-septal segment, grade I DD, trivial MR. He had c/o of worsening leg edema, PCP had started him on Lasix '20mg'$  daily, which did not make significant improvement, and his lasix was increased to '40mg'$  daily at the appointment. He was doing well and had no angina at the  time.   Spoke to the patient over the phone. Patient presented to the ER today complaining COVID symptoms including generalized weakness, fatigue, myalgia, chest pain, shortness of breath, cough, headache, fever that has improved with Tylenol.  Patient states symptoms started yesterday, he is feeling overall better now. Patient had a syncope episode last night at 2230 after coughing spell. He states he was coughing very hard and got very SOB. He believed he passed out for 5 minutes and did not injury himself. He states her chest pain only occurs when he cough intensely.  He self tested at home which was positive for COVID-19. He had some questions about his meds.   Admission diagnostic revealed hyponatremia 134, elevated creatinine 1.25, and GFR >60. HS troponin 18 >24. BNP 11.2. LDH 146. Ferritin 54. CRP 0.9. Lactic acid WNL. Procal <0.1. CBC unremarkable. D dimer 0.49.  Fibrinogen 408.  TSH WNL.  Blood culture x2 sent.  CT chest negative for PE, mild bibasilar subsegmental atelectasis, similar appearing cicatricial atelectasis in the left lower lobe and lingula, evidence of prior granulomatous disease, hepatic steatosis.  EKG reveals sinus tachycardia with ventricular rate of 102 bpm, resolved to TWI from lateral leads, artifact.  He was given 1 g Tylenol, 12.5 mg Benadryl, 30 mg IV Toradol, 10 mg Reglan, Paxlovid, and 1L normal saline bolus.  Cardiology is asked to see the patient for elevated troponin.     Past Medical History:  Diagnosis Date   Allergy    Atypical mole 11/23/2000   slight to moderate-  Right upper back (WS)   Atypical mole 11/23/2000   slight to moderate-right uppermost inner thigh (WS)   Atypical mole 10/24/2009   severe-right mid abdomen (EXC)   Atypical mole 01/06/2019   left mi back    Basal cell carcinoma 09/14/2012   superficial-left upper shoulder (txpbx)   Basal cell carcinoma 11/26/2017   sup + nod-right chest (CX35FU)   Basal cell carcinoma 01/06/2019    sup-right post shoulder (CX35FU)   BCC (basal cell carcinoma of skin) 10/24/2009   top right shoulder (CX35FU)   Calcaneal fracture 2014   right; s/p boot; reinjured in 2019   Colon polyp    Diverticulosis 6/09   rare L colon diverticuli   Elevated PSA 10/2012   benign prostate biopsies 11/2012   Esophageal stricture    s/p dilatation (Dr. Earlean Shawl)   GERD (gastroesophageal reflux disease)    Heart murmur    Hypertension    Hypertr obst cardiomyop    mild--Dr. Radford Pax (previously)   Internal hemorrhoid 6/09   Migraine age 29   resolved after nasal surgery   Nodular basal cell carcinoma ([place]) 11/18/2019   Mid Bridge Nose    Past Surgical History:  Procedure Laterality Date   CHOLECYSTECTOMY  5/03   COLONOSCOPY  11/2007, 2011, 2014   Dr. Rudi Rummage again 2019   dental implant     ELBOW SURGERY Right 06/2013   bone spur; Dr. Percell Miller   ESOPHAGOGASTRODUODENOSCOPY  2012   with dilatation.  Dr. Earlean Shawl   LIPOMA EXCISION     Dr. Stanford Breed from L paramedian prevertebral space   NASAL POLYP SURGERY  2008   SHOULDER ARTHROSCOPY  12/03; 1/06   right; left     Home Medications:  Prior to Admission medications   Medication Sig Start Date End Date Taking? Authorizing Provider  aspirin 81 MG tablet Take 81 mg by mouth daily.   Yes [provider]  atorvastatin (LIPITOR) 40 MG tablet TAKE 1 TABLET(40 MG) BY MOUTH DAILY AT 6 PM 04/17/20  Yes Buford Dresser, MD  cholecalciferol (VITAMIN D) 1000 units tablet Take 1,000 Units by mouth daily.   Yes [provider]  esomeprazole (NEXIUM) 40 MG capsule Take 40 mg by mouth 2 (two) times daily before a meal.   Yes [provider]  fexofenadine (ALLEGRA) 180 MG tablet Take 180 mg by mouth daily.   Yes [provider]  furosemide (LASIX) 40 MG tablet Take 1 tablet (40 mg total) by mouth daily. 01/18/21 07/17/21 Yes Loel Dubonnet, NP  mupirocin ointment (BACTROBAN) 2 % Apply 1 application topically 3  (three) times daily. 12/31/20  Yes Rita Ohara, MD  valsartan (DIOVAN) 80 MG tablet Take 1 tablet (80 mg total) by mouth daily. 08/07/20  Yes Buford Dresser, MD    Inpatient Medications: Scheduled Meds:  irbesartan  75 mg Oral Daily   nirmatrelvir/ritonavir EUA  3 tablet Oral BID   Continuous Infusions:  PRN Meds:   Allergies:    Allergies  Allergen Reactions   Coconut Fatty Acids Swelling    Social History:   Social History   Socioeconomic History   Marital status: Divorced    Spouse name: Not on file   Number of children: Not on file   Years of education: Not on file   Highest education level: Not on file  Occupational History   Occupation: [place]: [employer]  Tobacco Use   Smoking status: Former  Types: Cigarettes    Quit date: 06/24/1983    Years since quitting: 37.6   Smokeless tobacco: Never  Vaping Use   Vaping Use: Never used  Substance and Sexual Activity   Alcohol use: Yes    Comment: 1-2 drink/month (scotch) or less   Drug use: No   Sexual activity: Yes    Partners: Female  Other Topics Concern   Not on file  Social History Narrative   Divorced. 2 biologic children, a son and a daughter.  Adopted 3 children. 1 granddaughter Tanja [place], [place], born 06/2014)   Lives alone, no pets.   Social Determinants of Health   Financial Resource Strain: Not on file  Food Insecurity: Not on file  Transportation Needs: Not on file  Physical Activity: Not on file  Stress: Not on file  Social Connections: Not on file  Intimate Partner Violence: Not on file    Family History:    Family History  Problem Relation Age of Onset   Hypertension Mother    Dementia Mother    Cancer Mother        bladder, metastatic   Alcohol abuse Father    Alcohol abuse Brother    Cirrhosis Brother    Liver cancer Brother    Diabetes Neg Hx    Prostate cancer Neg Hx    Colon cancer Neg Hx      ROS:  Constitutional: see HPI Eyes:  Denied vision change or loss Ears/Nose/Mouth/Throat: see HPI  Cardiovascular: see HPI  Respiratory: see HPI Gastrointestinal: Denied nausea, vomiting, abdominal pain, diarrhea Genital/Urinary: Denied dysuria, hematuria, urinary frequency/urgency Musculoskeletal: Myalgia  Skin: Denied rash, wound Neuro: see HPI  Psych: Denied history of depression/anxiety  Endocrine: Denied history of diabetes   Physical Exam/Data:   Vitals:   01/22/21 1130 01/22/21 1145 01/22/21 1215 01/22/21 1245  BP: 128/81 138/81 (!) 144/92 (!) 154/121  Pulse: 90 86 (!) 102 88  Resp: 18 19 (!) 22 18  Temp:      TempSrc:      SpO2: 95% 94% 95% 93%   No intake or output data in the 24 hours ending 01/22/21 1434 Last 3 Weights 01/18/2021 01/07/2021 12/31/2020  Weight (lbs) 245 lb 9.6 oz 246 lb 248 lb 3.2 oz  Weight (kg) 111.403 kg 111.585 kg 112.583 kg     There is no height or weight on file to calculate BMI.   Physical exam per phone assessment:  Alert and oriented x3, appropriate speech, no cognitive deficit  Intermittent cough noted Defer detailed physical exam to MD, see addendum   EKG:  The EKG was personally reviewed and demonstrates: Sinus tachycardia with ventricular rate of 102, nonspecific ST-T change    Relevant CV Studies:  Echo 2013: (pdf scan)--noted as stress, but only resting echo in note EF >70%, mild cLVH. +SAM, trace MR   Echo 2017: Study Conclusions  - Left ventricle: The cavity size was normal. Wall thickness was   increased in a pattern of mild LVH. Systolic function was normal.   The estimated ejection fraction was in the range of 60% to 65%.   Wall motion was normal; there were no regional wall motion   abnormalities. Doppler parameters are consistent with abnormal   left ventricular relaxation (grade 1 diastolic dysfunction). - Aortic valve: There was no stenosis. - Mitral valve: There was trivial regurgitation. - Right ventricle: The cavity size was normal. Systolic  function   was normal. - Pulmonary arteries: No complete TR doppler jet so  unable to   estimate PA systolic pressure. - Inferior vena cava: The vessel was normal in size. The   respirophasic diameter changes were in the normal range (>= 50%),   consistent with normal central venous pressure.   Impressions:   - Normal LV size with mild LV hypertrophy. EF 60-65%. Normal RV   size and systolic function. No significant valvular   abnormalities.  Echo 01/03/21:   1. Left ventricular ejection fraction, by estimation, is 60 to 65%. The  left ventricle has normal function. The left ventricle has no regional  wall motion abnormalities. There is moderate left ventricular hypertrophy  of the basal-septal segment. Left  ventricular diastolic parameters are consistent with Grade I diastolic  dysfunction (impaired relaxation). Elevated left ventricular end-diastolic  pressure.   2. Right ventricular systolic function is normal. The right ventricular  size is normal.   3. The mitral valve is normal in structure. Trivial mitral valve  regurgitation. No evidence of mitral stenosis.   4. The aortic valve is normal in structure. Aortic valve regurgitation is  not visualized. No aortic stenosis is present.   5. The inferior vena cava is normal in size with greater than 50%  respiratory variability, suggesting right atrial pressure of 3 mmHg.   CT cardiac 03/03/2018 FINDINGS: The pulmonary veins drained normally to the left atrium. Calcium Score: 198 Agatston units Coronary Arteries: Right dominant with no anomalies LM: Short, no plaque or stenosis. LAD system: Relatively small caliber vessel. Mixed plaque proximal and mid LAD, appears to be mild stenosis. Circumflex system: Large LCx. Mixed plaque mid vessel without significant stenosis. RCA system: Calcified plaque distal RCA/PLV with mild stenosis.   IMPRESSION: 1. Coronary artery calcium score 198 Agatston units. This places the patient in  the 65th percentile for age and gender, suggesting intermediate risk for future cardiac events. 2. Extensive plaque in the proximal and mid LAD. Appears to be mild stenosis. 3.  Mild stenosis in distal RCA/PLV.   FFR without hemodynamically significant stenosis   Echo 01/03/21 1. Left ventricular ejection fraction, by estimation, is 60 to 65%. The  left ventricle has normal function. The left ventricle has no regional  wall motion abnormalities. There is moderate left ventricular hypertrophy  of the basal-septal segment. Left  ventricular diastolic parameters are consistent with Grade I diastolic  dysfunction (impaired relaxation). Elevated left ventricular end-diastolic  pressure.   2. Right ventricular systolic function is normal. The right ventricular  size is normal.   3. The mitral valve is normal in structure. Trivial mitral valve  regurgitation. No evidence of mitral stenosis.   4. The aortic valve is normal in structure. Aortic valve regurgitation is  not visualized. No aortic stenosis is present.   5. The inferior vena cava is normal in size with greater than 50%  respiratory variability, suggesting right atrial pressure of 3 mmHg.    Laboratory Data:  High Sensitivity Troponin:   Recent Labs  Lab 01/22/21 0033 01/22/21 0515  TROPONINIHS 18* 24*     Chemistry Recent Labs  Lab 01/22/21 0033  NA 134*  K 3.6  CL 101  CO2 24  GLUCOSE 111*  BUN 14  CREATININE 1.25*  CALCIUM 8.9  GFRNONAA >60  ANIONGAP 9    Recent Labs  Lab 01/22/21 0033  PROT 6.8  ALBUMIN 3.8  AST 26  ALT 28  ALKPHOS 70  BILITOT 1.1   Hematology Recent Labs  Lab 01/22/21 0033  WBC 5.5  RBC 5.22  HGB  13.9  HCT 42.3  MCV 81.0  MCH 26.6  MCHC 32.9  RDW 12.7  PLT 164   BNP Recent Labs  Lab 01/22/21 0033  BNP 23.7    DDimer  Recent Labs  Lab 01/22/21 0033  DDIMER 0.49     Radiology/Studies:  CT Angio Chest PE W and/or Wo Contrast  Result Date: 01/22/2021 CLINICAL  DATA:  70 year old male with syncope, COVID-19, shortness of breath. EXAM: CT ANGIOGRAPHY CHEST WITH CONTRAST TECHNIQUE: Multidetector CT imaging of the chest was performed using the standard protocol during bolus administration of intravenous contrast. Multiplanar CT image reconstructions and MIPs were obtained to evaluate the vascular anatomy. CONTRAST:  Forty-nine mL Omnipaque 350, intravenous COMPARISON:  Coronary chest CT from 03/03/2018 FINDINGS: Cardiovascular: Satisfactory opacification of the pulmonary arteries to the segmental level. No evidence of pulmonary embolism. Normal heart size. No pericardial effusion. Coronary atherosclerotic calcifications. Mild scattered thoracic aortic atherosclerotic calcifications. Mediastinum/Nodes: Left hilar and left AP window calcified lymph nodes. No pathologically enlarged mediastinal or hilar lymphadenopathy. No axillary or supraclavicular lymphadenopathy. The thyroid gland, trachea, and esophagus are within normal limits. Lungs/Pleura: An azygos fissure is noted. Mild lingular and left lower lobar cicatricial atelectasis, unchanged. Minimal bibasilar subsegmental atelectasis. No focal consolidations. No suspicious pulmonary nodules. No pleural effusion or pneumothorax. Upper Abdomen: Diffusely decreased attenuation of the hepatic parenchyma. Status post cholecystectomy. The remaining visualized upper abdomen is within normal limits. Musculoskeletal: Mild multilevel degenerative changes of the thoracic spine. No acute osseous abnormality. Review of the MIP images confirms the above findings. IMPRESSION: Vascular: 1. No pulmonary embolism. 2. Coronary and aortic atherosclerosis (ICD10-I70.0). Non-Vascular: 1.   Mild bibasilar subsegmental atelectasis. 2. Similar appearing cicatricial atelectasis in the left lower lobe and lingula. 3.   Evidence of prior granulomatous disease. 4.   Hepatic steatosis. Ruthann Cancer, MD [facility]  [facility] Electronically Signed   By: Ruthann Cancer MD   On: 01/22/2021 09:52   DG Chest Port 1 View  Result Date: 01/22/2021 CLINICAL DATA:  Chest pain and syncope. EXAM: PORTABLE CHEST 1 VIEW COMPARISON:  None. FINDINGS: Lung volumes are low. Upper normal heart size. Normal mediastinal contours for technique. There is mild central bronchial thickening. Subsegmental atelectasis at the bases. No pleural fluid or pneumothorax. No acute osseous abnormalities are seen. IMPRESSION: Low lung volumes with central bronchial thickening and bibasilar atelectasis. Electronically Signed   By: Keith Rake M.D.   On: 01/22/2021 00:55     Assessment and Plan:   Nonobstructive CAD Elevated troponin -CT coronary study from 2019 with coronary calcium score 198 placing him in 65th percentile for age/gender with extensisve plaque in LAD and mild stenosis in distal RCA/PLV. FFR was without significant stenosis. - HS trop 18 >24 - EKG without acute ischemic findings  - His presenting symptoms are due to COVID-19 infection and dehydration - No further ischemic work-up is indicated at this time -Continue secondary prevention with aspirin 81, resume valsartan 80 mg tomorrow as he received BP medication at ED, and may hold lipitor '40mg'$  daily until completion of Paxlovid   Syncope - Occurred after intense coughing spell - Not cardiac syncope, likely from coughing as well as dehydration  COVID-19 infection - managed per ER, given Paxlovid   HTN - BP elevated at ED, would resume home medication including valsartan tomorrow as he has received irbesartan '75mg'$  at ED today   Mild HCM - Echo from 01/03/21 stable - syncope is from coughing and dehydration - follow  up with cardiology outpatient when acute illness resolves   HLD -OK to hold statin lipitor '40mg'$  until completion of Paxlovid   Leg edema -May hold Lasix '40mg'$  daily for 48 hours before resume, ensure PO intake adequate and no longer febrile       For questions or updates, please contact [facility] Please consult [email or web address] for contact info under    Signed, Margie Billet, NP  01/22/2021 2:34 PM   Agree with note by Margie Billet NP  We are asked to see Mr. Losco for atypical chest pain.  He apparently was recent diagnosed with COVID-pneumonia.  The pain really only occurred while he coughed.  He does have a history of mild hypertrophic cardiomyopathy as well as hypertension.  His EKG showed no acute changes.  His enzymes were low and flat.  He did have a CT FFR done in 2019 which was negative, there was coronary calcium score was mildly elevated at 198.  At this point, I do not think further work-up is required.  Lorretta Harp, M.D., [place], [facility], [place] [address] [place], [place]  [id number]  [phone] 01/22/2021 3:50 PM

## 2021-01-22 NOTE — ED Provider Notes (Signed)
Emergency Medicine Provider Triage Evaluation Note  Erik Leon , a 70 y.o. male  was evaluated in triage.  Pt complains of fatigue, fevers, myalgias, cough, generalized weakness, chest pain, shortness of breath onset yesterday.  Tested positive for COVID-19 at home.  Was vaccinated by Toma Aran in September 2021.  Patient reports this evening he had a coughing episode which caused him to have a subsequent syncopal episode and he has continued to feel poorly.  No other aggravating or alleviating factors.  History of hypertension but reports he is out of his medication.  Also has a history of hypertrophic cardiomyopathy.  Review of Systems  Positive: Fever, chills, weakness, myalgias, cough, congestion Negative: Nausea, vomiting, diarrhea  Physical Exam  BP (!) 161/75 (BP Location: Left Arm)   Pulse (!) 107   Temp 97.7 F (36.5 C) (Oral)   Resp 17   SpO2 96%  Gen:   Awake, no distress, generally ill-appearing Resp:  Normal effort  MSK:   Moves extremities without difficulty  Other:  Abdomen soft and nontender     Medical Decision Making  Medically screening exam initiated at 12:26 AM.  Appropriate orders placed.  Francoise Ceo was informed that the remainder of the evaluation will be completed by another provider, this initial triage assessment does not replace that evaluation, and the importance of remaining in the ED until their evaluation is complete.  COVID-positive with syncopal episode today.  Labs and imaging pending.   Raeghan Demeter, Gwenlyn Perking 01/22/21 0027    Orpah Greek, MD 01/22/21 [phone]

## 2021-01-27 LAB — CULTURE, BLOOD (ROUTINE X 2)
Culture: NO GROWTH
Culture: NO GROWTH

## 2021-01-30 ENCOUNTER — Encounter: Payer: Self-pay | Admitting: Family Medicine

## 2021-01-30 NOTE — Progress Notes (Signed)
Chief Complaint  Patient presents with   Hypertension    Nonfasting med check, no concerns. Has not gotten Tdap or Shingrix yet.    Patient presents for his regularly scheduled 6 month med check. He recently had COVID, and during the course of a coughing attack related to COVID, he had a syncopal event.  He had evaluation in the ER, which included negative CT angio. He had elevated Troponin, cardiology was consulted and felt the mild elevation was due to demand ischemia and he was safe for d/c home.  He was treated with Paxlovid for COVID-19, and his statin was held during that time. He tested + for COVID on 8/1, finished the Paxlovid on 8/6. He has since had 3 negative tests (today, and the last 2 days). He actually reports his taste just returned (had been gone since 2020). He hasn't had any coffee since 1/31. He thinks he may still be drinking too much water.  Prior to this, he had been seen here with evaluation of LE edema, DOE, as well as seeing cardiologist.  At his f/u here he reported temporary improvement in edema (but recurred), and tingling in legs improved, DOE had improved. Last visit with cardiologist was 7/29.  At that time he reported minimal improvement in his edema despite daily 20mg of lasix. Lasix was increased to 40mg daily. He was to reduce fluid intake, continue to limit sodium, and leg elevation and compression socks were encouraged. Cardiology reported consider vascular referral if not responding to these measures. He was supposed to have b-met checked after a week at the 40mg dose of lasix.  He had chem panel done in ER a few days later (but ill--K+ had dropped but was WNL, Cr higher).     Chemistry      Component Value Date/Time   NA 134 (L) 01/22/2021 0033   NA 140 01/07/2021 1216   K 3.6 01/22/2021 0033   CL 101 01/22/2021 0033   CO2 24 01/22/2021 0033   BUN 14 01/22/2021 0033   BUN 16 01/07/2021 1216   CREATININE 1.25 (H) 01/22/2021 0033   CREATININE 1.12  07/19/2015 0001      Component Value Date/Time   CALCIUM 8.9 01/22/2021 0033   ALKPHOS 70 01/22/2021 0033   AST 26 01/22/2021 0033   ALT 28 01/22/2021 0033   BILITOT 1.1 01/22/2021 0033   BILITOT 0.8 12/31/2020 1549     He was told to restart the lasix after finishing paxlovid.  He finished that Saturday, but he started back on the lasix at 40mg earlier this week (Monday).  He has a lab slip to get b-met checked, plans to do next week. He ordered compression socks--were too tight, got new ones today, hasn't worn yet.  He is limiting his sodium in his diet.  He weighs himself daily and has lost weight. He finds the swelling is significantly improved in the morning, recurs during the day, more on the R than on the LLE. He reports that his energy is much better than when previously seen, and that he no longer has any DOE.   Hypertension:  reports compliance with valsartan (changed from lisinopril in 07/2020 by cardiologist due to cough, which resolved with change).  He denies headaches, dizziness. BP's are running 100-115/60-75 at home. Pulse 57-65.  Denies dizziness or headaches.  Nonobstructive CAD--He continues on aspirin and atorvastatin with no angina.  No longer having DOE and his fatigue has improved.  Hyperlipidemia:  He is compliant with   atorvastatin 3m daily. Denies side effects.  Lab Results  Component Value Date   CHOL 140 08/02/2020   HDL 51 08/02/2020   LDLCALC 75 08/02/2020   TRIG 86 01/22/2021   CHOLHDL 2.7 08/02/2020   Impaired fasting glucose: Recent A1c was noted to be higher (up from 5.8% in 07/2020).  He hadn't exercised in 3 months prior to July visit, gained some weight. He has tried to eat less sugar. He is down 13# since 7/29 (dose of lasix was increased on that day, but not taken while he had COVID). Energy is better, got on treadmill today x 15 minutes.  Lab Results  Component Value Date   HGBA1C 6.2 (A) 12/31/2020    PMH, PSH, SH  reviewed  Outpatient Encounter Medications as of 01/31/2021  Medication Sig Note   aspirin 81 MG tablet Take 81 mg by mouth daily.    atorvastatin (LIPITOR) 40 MG tablet TAKE 1 TABLET(40 MG) BY MOUTH DAILY AT 6 PM    cholecalciferol (VITAMIN D) 1000 units tablet Take 1,000 Units by mouth daily.    esomeprazole (NEXIUM) 40 MG capsule Take 40 mg by mouth 2 (two) times daily before a meal. 01/31/2021: Taking just in the mornings this week (and has been fine)   fexofenadine (ALLEGRA) 180 MG tablet Take 180 mg by mouth daily.    furosemide (LASIX) 40 MG tablet Take 1 tablet (40 mg total) by mouth daily. 01/31/2021: Restarted taking it 8/8   valsartan (DIOVAN) 80 MG tablet Take 1 tablet (80 mg total) by mouth daily.    [DISCONTINUED] mupirocin ointment (BACTROBAN) 2 % Apply 1 application topically 3 (three) times daily.    No facility-administered encounter medications on file as of 01/31/2021.   Allergies  Allergen Reactions   Coconut Fatty Acids Swelling   ROS: no chest pain, shortness of breath, n/v/d, dysuria, hematuria. Recent COVID--no residual symptoms except taste came back.  Energy is improved. Edema is much better on the LLE, down some on the right.  Much better in the mornings on both legs.   PHYSICAL EXAM:  BP 110/60   Pulse 80   Ht 5' 10" (1.778 m)   Wt 241 lb 9.6 oz (109.6 kg)   BMI 34.67 kg/m   Wt Readings from Last 3 Encounters:  01/31/21 241 lb 9.6 oz (109.6 kg)  01/18/21 245 lb 9.6 oz (111.4 kg)  01/07/21 246 lb (111.6 kg)   Well-appearing, obese male, in good spirits. He is in no distress, speaking easily and comfortably. No coughing/congestion. HEENT: conjunctiva and sclera are clear, EOMI, wearing mask Neck: No lymphadenopathy, thyromegaly or carotid bruit Heart: regular rate and rhythm Murmur noted at L>R USB, unchanged. Chest: nontender Lungs: clear bilaterally Back: no spinal or CVA tenderness Abdomen: obese, soft, nontender, no organomegaly or  mass Extremities: 2+ pulses.  Trace pitting edema on left, 1+ on right Scabbed lesion at L mid-shin, no surrounding erythema or STS. Psych: normal mood, affect, hygiene and grooming   ASSESSMENT/PLAN:  Impaired fasting glucose - counseled re: diet, exercise, wt loss. Pt would like to see nutritionist--will be OOT and okay to wait until f/u for referral  Essential hypertension - well controlled; consider change to ARB-HCT combo if ongoing issues with edema  Peripheral edema - improved, still has R>L. Wear compression stockings, leg elevation, low Na diet. Cont Laskx, and to get labs next week per cardio  Medication monitoring encounter  History of COVID-19 - last week; sx resolved with Paxlovid. Disc poss of  rebound/retest/isolate if occurs.  Vaccine counseling - reminded to get flu shot next month, Tdap from pharmacy, and Shingrix from Pharmacy. Risks/SE reviewed  F/u 3 mos, A1c at visit  Meds rx'd by cardiology (and Nexium is OTC) 

## 2021-01-31 ENCOUNTER — Other Ambulatory Visit: Payer: Self-pay

## 2021-01-31 ENCOUNTER — Ambulatory Visit (INDEPENDENT_AMBULATORY_CARE_PROVIDER_SITE_OTHER): Payer: Medicare Other | Admitting: Family Medicine

## 2021-01-31 ENCOUNTER — Encounter: Payer: Self-pay | Admitting: Family Medicine

## 2021-01-31 VITALS — BP 110/60 | HR 80 | Ht 70.0 in | Wt 241.6 lb

## 2021-01-31 DIAGNOSIS — Z7185 Encounter for immunization safety counseling: Secondary | ICD-10-CM | POA: Diagnosis not present

## 2021-01-31 DIAGNOSIS — Z8616 Personal history of COVID-19: Secondary | ICD-10-CM | POA: Diagnosis not present

## 2021-01-31 DIAGNOSIS — R609 Edema, unspecified: Secondary | ICD-10-CM

## 2021-01-31 DIAGNOSIS — Z5181 Encounter for therapeutic drug level monitoring: Secondary | ICD-10-CM | POA: Diagnosis not present

## 2021-01-31 DIAGNOSIS — I10 Essential (primary) hypertension: Secondary | ICD-10-CM | POA: Diagnosis not present

## 2021-01-31 DIAGNOSIS — R7301 Impaired fasting glucose: Secondary | ICD-10-CM

## 2021-01-31 NOTE — Patient Instructions (Addendum)
Please get your tetanus (TdaP) and shingrix (series of 2 shingles vaccines, given 2 months apart) from the pharmacy at your convenience.  Limit your sugar, carbs (bread/rice/pasta/potates). Try and get 30 minutes of daily exercise.  Return in 3 months--at that visit we will check the A1c. If it remains up we will refer you to see a nutritionist.

## 2021-04-19 ENCOUNTER — Encounter (HOSPITAL_BASED_OUTPATIENT_CLINIC_OR_DEPARTMENT_OTHER): Payer: Medicare Other | Admitting: Cardiology

## 2021-04-19 ENCOUNTER — Other Ambulatory Visit: Payer: Self-pay

## 2021-04-19 ENCOUNTER — Encounter (HOSPITAL_BASED_OUTPATIENT_CLINIC_OR_DEPARTMENT_OTHER): Payer: Self-pay

## 2021-04-25 ENCOUNTER — Encounter (HOSPITAL_BASED_OUTPATIENT_CLINIC_OR_DEPARTMENT_OTHER): Payer: Self-pay

## 2021-04-25 DIAGNOSIS — I872 Venous insufficiency (chronic) (peripheral): Secondary | ICD-10-CM

## 2021-04-25 DIAGNOSIS — I10 Essential (primary) hypertension: Secondary | ICD-10-CM

## 2021-04-25 DIAGNOSIS — R600 Localized edema: Secondary | ICD-10-CM

## 2021-04-26 MED ORDER — VALSARTAN 80 MG PO TABS
80.0000 mg | ORAL_TABLET | Freq: Every day | ORAL | 3 refills | Status: DC
Start: 2021-04-26 — End: 2022-04-28

## 2021-04-26 MED ORDER — FUROSEMIDE 40 MG PO TABS
40.0000 mg | ORAL_TABLET | Freq: Every day | ORAL | 5 refills | Status: DC
Start: 2021-04-26 — End: 2021-07-26

## 2021-05-01 ENCOUNTER — Encounter: Payer: Medicare Other | Admitting: Family Medicine

## 2021-05-05 NOTE — Progress Notes (Addendum)
Chief Complaint  Patient presents with   Hypertension    Fasting med check, no concern. Has not gotten Tdap or Shingrix at pharmacy. Does not want flu or covid booster today.    Patient presents for 3 month follow-up.  Impaired fasting glucose: Recent A1c was noted to be higher (up to 6.2% from 5.8% in 07/2020).  He hadn't exercised in 3 months prior to July visit, gained some weight. He has tried to eat less sugar. Exercise--just started 10 days ago (treadmill, situps, pushups),; he and his son signed up for [employer] ([employer]), 3x/week. He has felt too lethargic since COVID to exercise up until this time.  He is doing this to try and help get his strength back, and help with diet (keep him motivated)  Hypertension:  reports compliance with valsartan.  He denies headaches, dizziness. He had been having lower extremity edema in July, was started on daily lasix 40mg  by his cardiologist.  Meds were recently refilled, and he is scheduled for appointment with Dr. Harrell Gave 05/13/21. BP's are running 100-110/58-64 at home. Pulse 57-70.   He is no longer having any swelling.  He is on daily lasix and wearing compression socks for the last month.  He likes the ones with copper in them better. Denies any DOE, no chest pain. Slight dizziness if stands quickly, short-lived.   Nonobstructive CAD--He continues on aspirin and atorvastatin with no angina.  No longer having DOE. No bleeding, bruising or GI complaints. Coronary and aortic atherosclerosis were noted on CT angio done in ER 01/2021   Hyperlipidemia:  He is compliant with atorvastatin 40mg  daily. Denies side effects. This is rx'd by cardiologist.  Last lipids were in 07/2020: Lab Results  Component Value Date   CHOL 140 08/02/2020   HDL 51 08/02/2020   LDLCALC 75 08/02/2020   TRIG 86 01/22/2021   CHOLHDL 2.7 08/02/2020    PMH, PSH, SH reviewed  Outpatient Encounter Medications as of 05/06/2021  Medication  Sig   aspirin 81 MG tablet Take 81 mg by mouth daily.   atorvastatin (LIPITOR) 40 MG tablet TAKE 1 TABLET(40 MG) BY MOUTH DAILY AT 6 PM   cholecalciferol (VITAMIN D) 1000 units tablet Take 1,000 Units by mouth daily.   esomeprazole (NEXIUM) 40 MG capsule Take 40 mg by mouth 2 (two) times daily before a meal.   fexofenadine (ALLEGRA) 180 MG tablet Take 180 mg by mouth daily.   furosemide (LASIX) 40 MG tablet Take 1 tablet (40 mg total) by mouth daily.   valsartan (DIOVAN) 80 MG tablet Take 1 tablet (80 mg total) by mouth daily.   No facility-administered encounter medications on file as of 05/06/2021.   Allergies  Allergen Reactions   Coconut Fatty Acids Swelling    ROS: Denies fever, chills, URI symptoms, headaches, dizziness, shortness of breath, chest pain.   Denies nausea, vomiting, bowel changes, urinary complaints, bleeding, bruising, rash. Edema is controlled. Brain fog and fatigue since COVID in August.  Most noticeable at 3pm. Sleeps well, wakes up refreshed.    PHYSICAL EXAM:  BP 110/68   Pulse 68   Ht 5\' 10"  (1.778 m)   Wt 243 lb 3.2 oz (110.3 kg)   BMI 34.90 kg/m   Wt Readings from Last 3 Encounters:  05/06/21 243 lb 3.2 oz (110.3 kg)  04/19/21 246 lb (111.6 kg)  01/31/21 241 lb 9.6 oz (109.6 kg)   Well-appearing, obese male, in good spirits, in no distress HEENT: conjunctiva and  sclera are clear, EOMI, wearing mask Neck: No lymphadenopathy, thyromegaly or carotid bruit Heart: regular rate and rhythm. Murmur noted at L>R USB, unchanged. Chest: nontender Lungs: clear bilaterally Back: no spinal or CVA tenderness Abdomen: obese, soft, nontender, no organomegaly or mass Extremities: wearing compression socks, no pitting edema noted Psych: normal mood, affect, hygiene and grooming  Lab Results  Component Value Date   HGBA1C 6.0 (A) 05/06/2021     ASSESSMENT/PLAN:  Impaired fasting glucose - discussed 2 wk trial of [employer] monitor to help with his  diet/sugars. Should be getting help with nutrition through [employer]. Encouraged good food choices - Plan: HgB A1c  Essential hypertension - well controlled  Peripheral edema - resolved, using compression socks and daily lasix  Pure hypercholesterolemia - cont statin (rx'd by cards).  Will recheck lipids at wellness visit in April if not done by cardiology (has upcoming appt)   Declined flu shot and COVID booster. Reminded to get Tdap and shingrix from pharmacy  F/u as scheduled for [facility] in 09/2021  I spent 30 minutes dedicated to the care of this patient, including pre-visit review of records, face to face time, post-visit ordering of testing and documentation.

## 2021-05-05 NOTE — Patient Instructions (Addendum)
I hope you make some great strides with diet, exercise, weight, energy and over-all feeling better with the [employer] program. I hope the [facility] [place] trial helps you make some changes to keep the sugars down.  You are due for tetanus booster (TdaP).  You need to get this from the pharmacy.  I recommend getting the new shingles vaccine (Shingrix). Since you have Medicare, you will need to get this from the pharmacy, as it is covered by Part D. This is a series of 2 injections, spaced 2 months apart.    These vaccines should be separated from other vaccines by at least 2 weeks.

## 2021-05-06 ENCOUNTER — Encounter: Payer: Self-pay | Admitting: Family Medicine

## 2021-05-06 ENCOUNTER — Other Ambulatory Visit: Payer: Self-pay

## 2021-05-06 ENCOUNTER — Ambulatory Visit (INDEPENDENT_AMBULATORY_CARE_PROVIDER_SITE_OTHER): Payer: Medicare Other | Admitting: Family Medicine

## 2021-05-06 VITALS — BP 110/68 | HR 68 | Ht 70.0 in | Wt 243.2 lb

## 2021-05-06 DIAGNOSIS — I700 Atherosclerosis of aorta: Secondary | ICD-10-CM

## 2021-05-06 DIAGNOSIS — I10 Essential (primary) hypertension: Secondary | ICD-10-CM

## 2021-05-06 DIAGNOSIS — R609 Edema, unspecified: Secondary | ICD-10-CM

## 2021-05-06 DIAGNOSIS — R7301 Impaired fasting glucose: Secondary | ICD-10-CM

## 2021-05-06 DIAGNOSIS — E7800 Pure hypercholesterolemia, unspecified: Secondary | ICD-10-CM

## 2021-05-06 LAB — POCT GLYCOSYLATED HEMOGLOBIN (HGB A1C): Hemoglobin A1C: 6 % — AB (ref 4.0–5.6)

## 2021-05-08 DIAGNOSIS — I700 Atherosclerosis of aorta: Secondary | ICD-10-CM | POA: Insufficient documentation

## 2021-05-13 ENCOUNTER — Ambulatory Visit (INDEPENDENT_AMBULATORY_CARE_PROVIDER_SITE_OTHER): Payer: Medicare Other | Admitting: Cardiology

## 2021-05-13 ENCOUNTER — Other Ambulatory Visit: Payer: Self-pay

## 2021-05-13 ENCOUNTER — Encounter (HOSPITAL_BASED_OUTPATIENT_CLINIC_OR_DEPARTMENT_OTHER): Payer: Self-pay | Admitting: Cardiology

## 2021-05-13 VITALS — BP 128/82 | HR 83 | Ht 70.0 in | Wt 239.0 lb

## 2021-05-13 DIAGNOSIS — R0789 Other chest pain: Secondary | ICD-10-CM | POA: Diagnosis not present

## 2021-05-13 DIAGNOSIS — R5383 Other fatigue: Secondary | ICD-10-CM | POA: Diagnosis not present

## 2021-05-13 DIAGNOSIS — I422 Other hypertrophic cardiomyopathy: Secondary | ICD-10-CM

## 2021-05-13 DIAGNOSIS — E7800 Pure hypercholesterolemia, unspecified: Secondary | ICD-10-CM | POA: Diagnosis not present

## 2021-05-13 DIAGNOSIS — I10 Essential (primary) hypertension: Secondary | ICD-10-CM | POA: Diagnosis not present

## 2021-05-13 DIAGNOSIS — I2510 Atherosclerotic heart disease of native coronary artery without angina pectoris: Secondary | ICD-10-CM | POA: Diagnosis not present

## 2021-05-13 MED ORDER — ATORVASTATIN CALCIUM 40 MG PO TABS
ORAL_TABLET | ORAL | 3 refills | Status: DC
Start: 2021-05-13 — End: 2021-08-23

## 2021-05-13 NOTE — Progress Notes (Signed)
Cardiology Office Note:    Date:  05/13/2021   ID:  Erik Leon, DOB 01-15-1951, MRN [id number]  PCP:  Rita Ohara, MD  Cardiologist:  Buford Dresser, MD PhD  Referring MD: Rita Ohara, MD   CC: follow up  History of Present Illness:    Erik Leon is a 70 y.o. male with a hx of hypertension and mild HCM who is seen for follow up. He was initially seen as a new consult at the request of Dr. Tomi Bamberger for evaluation of chest pain 02/01/18.  Cardiac history: Chest pain 2019: intermittent. No aggravating factors. Last 10 seconds. If he exerts himself, feels that his pulse goes faster, but doesn't get discomfort. Started several years ago. Happens once every couple months. Last occurrence two weeks ago, occurred while sitting at his desk. Feels like when "you swallow something and it goes down the wrong pipe." Feels like it is under his heart. No radiation, no associated symptoms. He reports multiple prior stress tests, though I do not have the records. Noted as stress in Epic in 2013, but only normal echo results attached.  FH: no known CAD. Brother with congenital cardiac defect (below), may have passed from SCD.  HCM: echo in 2013 pdf reports SAM, though I cannot see additional details. Noted as mild LVH, I do not have measurements or gradients. Echo here in 2017 noted septal thickness as 1.5 cm and posterior wall thickness as 1.37 cm. No gradient noted.   Today: In 01/2021 he was infected with Covid for the 3rd time. This infection was his worst. During his infection he had an isolated incident of syncope, which he attributes to losing his breath after excessive coughing. Since then he has felt listless and lethargic. He cannot pinpoint but feels overall more fatigued than he has previously.  Occasionally he feels "something in his heart" that is difficult to describe. He denies any chest pain, but still notices discomfort. During one episode, he was sitting on the sofa, and felt  like he was burning up while his family member was freezing in the same room. At the time he otherwise felt fine but his blood pressure was found to be 145/95, which decreased to 112/75 within an hour after taking 2 tablets of 385 mg aspirin. Also, he has felt these episodes while experiencing dizziness due to standing up too quickly. There have been no events while walking.  In the past 3 months, his most strenuous activity has been climbing stairs up to 10-15 times a day. Usually he is winded while walking up stairs, which he attributes to being overweight and deconditioned. He had plans to increase his physical activity, but then he was infected with Covid.   Lately his ankle edema seems to be improving. However, he has developed more LE muscle cramps. In the past few days he has been trying to eat more potassium rich foods, which has resolved most of his muscle cramps.   Concerning his diet, he does not eat much but stays hydrated. What he does eat, he notes is usually not healthy such as sweets. He may have up to 3 cups of coffee in a day. His alcohol consumption is typically very rare, maybe 5 drinks in a year.  He denies any headaches, orthopnea, or PND. Of note, he reports having dry, flaky skin.  Past Medical History:  Diagnosis Date   Allergy    Atypical mole 11/23/2000   slight to moderate- Right upper back (WS)  Atypical mole 11/23/2000   slight to moderate-right uppermost inner thigh (WS)   Atypical mole 10/24/2009   severe-right mid abdomen (EXC)   Atypical mole 01/06/2019   left mi back    Basal cell carcinoma 09/14/2012   superficial-left upper shoulder (txpbx)   Basal cell carcinoma 11/26/2017   sup + nod-right chest (CX35FU)   Basal cell carcinoma 01/06/2019   sup-right post shoulder (CX35FU)   BCC (basal cell carcinoma of skin) 10/24/2009   top right shoulder (CX35FU)   Calcaneal fracture 2014   right; s/p boot; reinjured in 2019   Colon polyp    Diverticulosis  6/09   rare L colon diverticuli   Elevated PSA 10/2012   benign prostate biopsies 11/2012   Esophageal stricture    s/p dilatation (Dr. Earlean Shawl)   GERD (gastroesophageal reflux disease)    Heart murmur    Hypertension    Hypertr obst cardiomyop    mild--Dr. Radford Pax (previously)   Internal hemorrhoid 6/09   Migraine age 29   resolved after nasal surgery   Nodular basal cell carcinoma ([place]) 11/18/2019   [place] Nose    Past Surgical History:  Procedure Laterality Date   CHOLECYSTECTOMY  5/03   COLONOSCOPY  11/2007, 2011, 2014   Dr. Rudi Rummage again 2019   dental implant     ELBOW SURGERY Right 06/2013   bone spur; Dr. Percell Miller   ESOPHAGOGASTRODUODENOSCOPY  2012   with dilatation.  Dr. Earlean Shawl   LIPOMA EXCISION     Dr. Stanford Breed from L paramedian prevertebral space   NASAL POLYP SURGERY  2008   SHOULDER ARTHROSCOPY  12/03; 1/06   right; left    Current Medications: Current Outpatient Medications on File Prior to Visit  Medication Sig   aspirin 81 MG tablet Take 81 mg by mouth daily.   cholecalciferol (VITAMIN D) 1000 units tablet Take 1,000 Units by mouth daily.   esomeprazole (NEXIUM) 40 MG capsule Take 40 mg by mouth 2 (two) times daily before a meal.   fexofenadine (ALLEGRA) 180 MG tablet Take 180 mg by mouth daily.   furosemide (LASIX) 40 MG tablet Take 1 tablet (40 mg total) by mouth daily.   valsartan (DIOVAN) 80 MG tablet Take 1 tablet (80 mg total) by mouth daily.   No current facility-administered medications on file prior to visit.     Allergies:   Coconut fatty acids   Social History   Tobacco Use   Smoking status: Former    Types: Cigarettes    Quit date: 06/24/1983    Years since quitting: 37.9   Smokeless tobacco: Never  Vaping Use   Vaping Use: Never used  Substance Use Topics   Alcohol use: Yes    Comment: 1-2 drink/month (scotch) or less   Drug use: No    Family History: The patient's family history includes Alcohol abuse in his brother  and father; Cancer in his mother; Cirrhosis in his brother; Dementia in his mother; Hypertension in his mother; Liver cancer in his brother. There is no history of Diabetes, Prostate cancer, or Colon cancer. Half-brother had a birth defect and died after playing handball at age 83. Not HCM as far as he knows. There were 11 sons, no other with heart problems. Not sure about his dad's history, he was an alcoholic. No MI or stroke.  ROS:   Please see the history of present illness.   (+) Chest discomfort (+) Shortness of breath (+) Lightheadedness (+) Fatigue (+) LE edema, muscle  cramps Additional pertinent ROS otherwise unremarkable.  EKGs/Labs/Other Studies Reviewed:    The following studies were reviewed today:  CTA Chest 01/22/2021: COMPARISON:  Coronary chest CT from 03/03/2018   FINDINGS: Cardiovascular: Satisfactory opacification of the pulmonary arteries to the segmental level. No evidence of pulmonary embolism. Normal heart size. No pericardial effusion. Coronary atherosclerotic calcifications. Mild scattered thoracic aortic atherosclerotic calcifications.   Mediastinum/Nodes: Left hilar and left AP window calcified lymph nodes. No pathologically enlarged mediastinal or hilar lymphadenopathy. No axillary or supraclavicular lymphadenopathy. The thyroid gland, trachea, and esophagus are within normal limits.   Lungs/Pleura: An azygos fissure is noted. Mild lingular and left lower lobar cicatricial atelectasis, unchanged. Minimal bibasilar subsegmental atelectasis. No focal consolidations. No suspicious pulmonary nodules. No pleural effusion or pneumothorax.   Upper Abdomen: Diffusely decreased attenuation of the hepatic parenchyma. Status post cholecystectomy. The remaining visualized upper abdomen is within normal limits.   Musculoskeletal: Mild multilevel degenerative changes of the thoracic spine. No acute osseous abnormality.   Review of the MIP images confirms the  above findings.   IMPRESSION: Vascular:   1. No pulmonary embolism. 2. Coronary and aortic atherosclerosis (ICD10-I70.0).   Non-Vascular:   1.   Mild bibasilar subsegmental atelectasis.   2. Similar appearing cicatricial atelectasis in the left lower lobe and lingula.   3.   Evidence of prior granulomatous disease.   4.   Hepatic steatosis.  Echo 01/03/2021:  1. Left ventricular ejection fraction, by estimation, is 60 to 65%. The  left ventricle has normal function. The left ventricle has no regional  wall motion abnormalities. There is moderate left ventricular hypertrophy  of the basal-septal segment. Left  ventricular diastolic parameters are consistent with Grade I diastolic  dysfunction (impaired relaxation). Elevated left ventricular end-diastolic  pressure.   2. Right ventricular systolic function is normal. The right ventricular  size is normal.   3. The mitral valve is normal in structure. Trivial mitral valve  regurgitation. No evidence of mitral stenosis.   4. The aortic valve is normal in structure. Aortic valve regurgitation is  not visualized. No aortic stenosis is present.   5. The inferior vena cava is normal in size with greater than 50%  respiratory variability, suggesting right atrial pressure of 3 mmHg.   CT cardiac 03/03/2018: FINDINGS: The pulmonary veins drained normally to the left atrium. Calcium Score: 198 Agatston units Coronary Arteries: Right dominant with no anomalies LM: Short, no plaque or stenosis. LAD system: Relatively small caliber vessel. Mixed plaque proximal and mid LAD, appears to be mild stenosis. Circumflex system: Large LCx. Mixed plaque mid vessel without significant stenosis. RCA system: Calcified plaque distal RCA/PLV with mild stenosis.   IMPRESSION: 1. Coronary artery calcium score 198 Agatston units. This places the patient in the 65th percentile for age and gender, suggesting intermediate risk for future cardiac  events. 2. Extensive plaque in the proximal and mid LAD. Appears to be mild stenosis. 3.  Mild stenosis in distal RCA/PLV.  FFR without hemodynamically significant stenosis  Echo 2017:  Study Conclusions  - Left ventricle: The cavity size was normal. Wall thickness was   increased in a pattern of mild LVH. Systolic function was normal.   The estimated ejection fraction was in the range of 60% to 65%.   Wall motion was normal; there were no regional wall motion   abnormalities. Doppler parameters are consistent with abnormal   left ventricular relaxation (grade 1 diastolic dysfunction). - Aortic valve: There was no stenosis. - Mitral valve:  There was trivial regurgitation. - Right ventricle: The cavity size was normal. Systolic function   was normal. - Pulmonary arteries: No complete TR doppler jet so unable to   estimate PA systolic pressure. - Inferior vena cava: The vessel was normal in size. The   respirophasic diameter changes were in the normal range (>= 50%),   consistent with normal central venous pressure.   Impressions:   - Normal LV size with mild LV hypertrophy. EF 60-65%. Normal RV   size and systolic function. No significant valvular   abnormalities.  Echo 2013: (pdf scan)--noted as stress, but only resting echo in note EF >70%, mild cLVH. +SAM, trace MR  EKG:  ECG personally reviewed.  05/13/2021: not ordered today 04/17/20: NSR with nonspecific ST changes, similar to 2017  Recent Labs: 12/31/2020: TSH 0.731 01/22/2021: ALT 28; B Natriuretic Peptide 23.7; BUN 14; Creatinine, Ser 1.25; Hemoglobin 13.9; Platelets 164; Potassium 3.6; Sodium 134   Recent Lipid Panel    Component Value Date/Time   CHOL 140 08/02/2020 1643   TRIG 86 01/22/2021 0033   HDL 51 08/02/2020 1643   CHOLHDL 2.7 08/02/2020 1643   CHOLHDL 4.0 02/21/2016 1035   VLDL 22 02/21/2016 1035   LDLCALC 75 08/02/2020 1643    Physical Exam:    VS:  BP 128/82 (BP Location: Right Arm, Patient  Position: Sitting, Cuff Size: Large)   Pulse 83   Ht 5\' 10"  (1.778 m)   Wt 239 lb (108.4 kg)   SpO2 97%   BMI 34.29 kg/m     Wt Readings from Last 3 Encounters:  05/13/21 239 lb (108.4 kg)  05/06/21 243 lb 3.2 oz (110.3 kg)  04/19/21 246 lb (111.6 kg)   GEN: Well nourished, well developed in no acute distress HEENT: Normal, moist mucous membranes NECK: No JVD CARDIAC: regular rhythm, normal S1 and S2, no rubs or gallops. 3/6 machinelike systolic murmur. VASCULAR: Radial and DP pulses 2+ bilaterally. No carotid bruits RESPIRATORY:  Clear to auscultation without rales, wheezing or rhonchi  ABDOMEN: Soft, non-tender, non-distended MUSCULOSKELETAL:  Ambulates independently SKIN: Warm and dry, no edema NEUROLOGIC:  Alert and oriented x 3. No focal neuro deficits noted. PSYCHIATRIC:  Normal affect   ASSESSMENT:    1. Other fatigue   2. Nonocclusive coronary atherosclerosis of native coronary artery   3. Hypertrophic cardiomyopathy ([place])   4. Atypical chest pain   5. Essential hypertension   6. Pure hypercholesterolemia     PLAN:    Fatigue, unclear etiology Chest discomfort, atypical Nonobstructive CAD -coronary CT 02/2018 with Ca score 198, prox/mid LAD plaque but no significant stenosis by FFR -he has nonspecific symptoms of fatigue, but able to climb stairs, etc. Atypical sensation in his chest -given that he has had an anatomic test, I would pursue a functional test. With his HCM, would be helpful to evaluate exercise capacity and wall movement at peak exercise. Given this, recommend stress echo. He is amenable Shared Decision Making/Informed Consent The risks [chest pain, shortness of breath, cardiac arrhythmias, dizziness, blood pressure fluctuations, myocardial infarction, stroke/transient ischemic attack, and life-threatening complications (estimated to be 1 in 10,000)], benefits (risk stratification, diagnosing coronary artery disease, treatment guidance) and  alternatives of a stress echocardiogram were discussed in detail with Mr. Serano and he agrees to proceed.   HCM Murmur -Predominantly focal septal hypertrophy. Max LVOT gradient noted on imaging was 12 mmHg. -asymptomatic, no syncope, no SCD in family related to HCM (though half brother had congenital heart condition  and died age 83 while exercising, which is concerning).  Hypertension:  -goal <130/80, at goal today -continue valsartan  Nonobstructive CAD Hypercholesterolemia: LDL goal <70.  -continue atorvastatin -continue aspirin  Secondary prevention -recommend heart healthy/Mediterranean diet, with whole grains, fruits, vegetable, fish, lean meats, nuts, and olive oil. Limit salt. -recommend moderate walking, 3-5 times/week for 30-50 minutes each session. Aim for at least 150 minutes.week. Goal should be pace of 3 miles/hours, or walking 1.5 miles in 30 minutes -recommend avoidance of tobacco products. Avoid excess alcohol.  Plan for follow up: 6 mos or sooner PRN  Medication Adjustments/Labs and Tests Ordered: Current medicines are reviewed at length with the patient today.  Concerns regarding medicines are outlined above.   Orders Placed This Encounter  Procedures   Cardiac Stress Test: Informed Consent Details: Physician/Practitioner Attestation; Transcribe to consent form and obtain patient signature   ECHOCARDIOGRAM STRESS TEST    Meds ordered this encounter  Medications   atorvastatin (LIPITOR) 40 MG tablet    Sig: TAKE 1 TABLET(40 MG) BY MOUTH DAILY AT 6 PM    Dispense:  90 tablet    Refill:  3    Patient Instructions  Medication Instructions:  Your Physician recommend you continue on your current medication as directed.    *If you need a refill on your cardiac medications before your next appointment, please call your pharmacy*   Lab Work: None ordered today   Testing/Procedures: Your physician has requested that you have a stress echocardiogram. For  further information please visit [email or web address]. Please follow instruction sheet as given. [place] [address]   Follow-Up: At [facility], you and your health needs are our priority.  As part of our continuing mission to provide you with exceptional heart care, we have created designated Provider Care Teams.  These Care Teams include your primary Cardiologist (physician) and Advanced Practice Providers (APPs -  Physician Assistants and Nurse Practitioners) who all work together to provide you with the care you need, when you need it.  We recommend signing up for the patient portal called "MyChart".  Sign up information is provided on this After Visit Summary.  MyChart is used to connect with patients for Virtual Visits (Telemedicine).  Patients are able to view lab/test results, encounter notes, upcoming appointments, etc.  Non-urgent messages can be sent to your provider as well.   To learn more about what you can do with MyChart, go to [email or web address].    Your next appointment:   6 month(s)  The format for your next appointment:   In Person  Provider:   Buford Dresser, MD           You are scheduled for an Echo Stress Test  Please arrive 15 minutes prior to your appointment time for registration and insurance purposes.  The test will take approximately 45 minutes to complete.  How to prepare for your Exercise Stress Test: Do bring a list of your current medications with you.  If not listed below, you may take your medications as normal. Do wear comfortable clothes (no dresses or overalls) and walking shoes, tennis shoes preferred (no heels or open toed shoes are allowed) Do Not wear cologne, perfume, aftershave or lotions (deodorant is allowed). Please report to [place] [address]  If these instructions are not followed, your test will have to be rescheduled.  If you have questions or concerns about your appointment, you can  call the Stress Lab at [phone].  If  you cannot keep your appointment, please provide 24 hours notification to the Stress Lab, to avoid a possible $50 charge to your account   I,Mathew Stumpf,acting as a scribe for Buford Dresser, MD.,have documented all relevant documentation on the behalf of Buford Dresser, MD,as directed by  Buford Dresser, MD while in the presence of Buford Dresser, MD.  I, Buford Dresser, MD, have reviewed all documentation for this visit. The documentation on 05/13/21 for the exam, diagnosis, procedures, and orders are all accurate and complete.   Signed, Buford Dresser, MD PhD 05/13/2021 5:46 PM    [facility]

## 2021-05-13 NOTE — Patient Instructions (Signed)
Medication Instructions:  Your Physician recommend you continue on your current medication as directed.    *If you need a refill on your cardiac medications before your next appointment, please call your pharmacy*   Lab Work: None ordered today   Testing/Procedures: Your physician has requested that you have a stress echocardiogram. For further information please visit [email or web address]. Please follow instruction sheet as given. [place] [address]   Follow-Up: At [facility], you and your health needs are our priority.  As part of our continuing mission to provide you with exceptional heart care, we have created designated Provider Care Teams.  These Care Teams include your primary Cardiologist (physician) and Advanced Practice Providers (APPs -  Physician Assistants and Nurse Practitioners) who all work together to provide you with the care you need, when you need it.  We recommend signing up for the patient portal called "MyChart".  Sign up information is provided on this After Visit Summary.  MyChart is used to connect with patients for Virtual Visits (Telemedicine).  Patients are able to view lab/test results, encounter notes, upcoming appointments, etc.  Non-urgent messages can be sent to your provider as well.   To learn more about what you can do with MyChart, go to [email or web address].    Your next appointment:   6 month(s)  The format for your next appointment:   In Person  Provider:   Buford Dresser, MD           You are scheduled for an Echo Stress Test  Please arrive 15 minutes prior to your appointment time for registration and insurance purposes.  The test will take approximately 45 minutes to complete.  How to prepare for your Exercise Stress Test: Do bring a list of your current medications with you.  If not listed below, you may take your medications as normal. Do wear comfortable clothes (no dresses or overalls) and walking  shoes, tennis shoes preferred (no heels or open toed shoes are allowed) Do Not wear cologne, perfume, aftershave or lotions (deodorant is allowed). Please report to [place] [address]  If these instructions are not followed, your test will have to be rescheduled.  If you have questions or concerns about your appointment, you can call the Stress Lab at [phone].  If you cannot keep your appointment, please provide 24 hours notification to the Stress Lab, to avoid a possible $50 charge to your account

## 2021-06-25 ENCOUNTER — Telehealth (HOSPITAL_COMMUNITY): Payer: Self-pay | Admitting: *Deleted

## 2021-06-25 NOTE — Telephone Encounter (Signed)
Left message with instructions for upcoming stress echo. Patient told to arrive by 1:30.  Erik Leon

## 2021-07-01 ENCOUNTER — Ambulatory Visit (HOSPITAL_COMMUNITY): Payer: Medicare Other

## 2021-07-25 ENCOUNTER — Encounter (HOSPITAL_BASED_OUTPATIENT_CLINIC_OR_DEPARTMENT_OTHER): Payer: Self-pay

## 2021-07-25 DIAGNOSIS — R600 Localized edema: Secondary | ICD-10-CM

## 2021-07-25 DIAGNOSIS — I872 Venous insufficiency (chronic) (peripheral): Secondary | ICD-10-CM

## 2021-07-26 MED ORDER — FUROSEMIDE 40 MG PO TABS: 40.0000 mg | ORAL_TABLET | Freq: Every day | ORAL | 3 refills | Status: DC

## 2021-08-06 DIAGNOSIS — M1711 Unilateral primary osteoarthritis, right knee: Secondary | ICD-10-CM | POA: Diagnosis not present

## 2021-08-07 ENCOUNTER — Other Ambulatory Visit: Payer: Self-pay | Admitting: Orthopedic Surgery

## 2021-08-07 DIAGNOSIS — M25561 Pain in right knee: Secondary | ICD-10-CM

## 2021-08-08 ENCOUNTER — Ambulatory Visit
Admission: RE | Admit: 2021-08-08 | Discharge: 2021-08-08 | Disposition: A | Discharge status: Home / Self Care | Payer: Medicare Other | Source: Ambulatory Visit | Attending: Orthopedic Surgery | Admitting: Orthopedic Surgery

## 2021-08-08 ENCOUNTER — Other Ambulatory Visit: Payer: Self-pay

## 2021-08-08 DIAGNOSIS — M25561 Pain in right knee: Secondary | ICD-10-CM

## 2021-08-09 ENCOUNTER — Other Ambulatory Visit: Payer: Medicare Other

## 2021-08-13 DIAGNOSIS — S83231A Complex tear of medial meniscus, current injury, right knee, initial encounter: Secondary | ICD-10-CM | POA: Diagnosis not present

## 2021-08-19 ENCOUNTER — Ambulatory Visit: Payer: Medicare Other | Admitting: Family Medicine

## 2021-08-20 ENCOUNTER — Telehealth (HOSPITAL_BASED_OUTPATIENT_CLINIC_OR_DEPARTMENT_OTHER): Payer: Self-pay | Admitting: Cardiology

## 2021-08-20 NOTE — Telephone Encounter (Signed)
Notes faxed to surgeon. This phone note will be removed from the preop pool. Richardson Dopp, PA-C  08/20/2021 5:57 PM

## 2021-08-20 NOTE — Telephone Encounter (Signed)
° °  Pre-operative Risk Assessment    Patient Name: Erik Leon  DOB: 21-Nov-1950 MRN: [id number]      Request for Surgical Clearance    Procedure:   Right Knee Arthroscopy, Meniscectomy  Date of Surgery:  Clearance 08/21/21                                 Surgeon:  Not specified Surgeon's Group or Practice Name:  [facility] Phone number:  [phone] ATTN: Sophronia Simas Fax number:  [phone]   Type of Clearance Requested:   - Medical   Need to hold ASA?--this is not asked for on the clearance paper received   Type of Anesthesia:   Choice   Additional requests/questions:  Please fax a copy of recent office visit note, labs, and EKG/special studies to the surgeon's office.  Barbaraann Faster   08/20/2021, 1:06 PM

## 2021-08-20 NOTE — Telephone Encounter (Signed)
° °  Name: Erik Leon DOB: 1951-02-20  MRN: [id number]  Primary Cardiologist: Buford Dresser, MD  Chart reviewed as part of pre-operative protocol coverage.   Past medical history Nonobstructive coronary artery disease by coronary CTA in 2019 Hypertrophic cardiomyopathy, no significant LVOT gradient at rest Hypertension Hyperlipidemia Aortic atherosclerosis  RCRI:  Perioperative Risk of Major Cardiac Event is (%): 0.4 (low risk) DASI:  Functional Capacity in METs is: 4.31 (functional status is fair )  Patient was contacted 08/20/2021 in reference to pre-operative risk assessment for pending surgery as outlined below.    He was last seen by Dr. Harrell Gave in November 2022.  Of note, stress testing was discussed at that time.  The patient had to cancel his stress test because of his knee injury. Since last seen, Erik Leon has done well without chest pain, significant shortness of breath.  He can still navigate steps with a 4 pronged cane. I reviewed his case with Dr. Harrell Gave by phone today.  She agrees that the patient can proceed with surgery without undergoing stress testing beforehand.  Recommendations: Based on ACC/AHA guidelines, the patient is at acceptable risk for the planned procedure and may proceed without further cardiovascular testing.  Aspirin can be held for 7 days prior to surgery and resume postop when felt to be safe   Please call with questions. Richardson Dopp, PA-C 08/20/2021, 5:54 PM

## 2021-08-21 DIAGNOSIS — Y999 Unspecified external cause status: Secondary | ICD-10-CM | POA: Diagnosis not present

## 2021-08-21 DIAGNOSIS — S83221A Peripheral tear of medial meniscus, current injury, right knee, initial encounter: Secondary | ICD-10-CM | POA: Diagnosis not present

## 2021-08-21 DIAGNOSIS — M659 Synovitis and tenosynovitis, unspecified: Secondary | ICD-10-CM | POA: Diagnosis not present

## 2021-08-21 DIAGNOSIS — G8918 Other acute postprocedural pain: Secondary | ICD-10-CM | POA: Diagnosis not present

## 2021-08-21 DIAGNOSIS — M94261 Chondromalacia, right knee: Secondary | ICD-10-CM | POA: Diagnosis not present

## 2021-08-21 DIAGNOSIS — M2241 Chondromalacia patellae, right knee: Secondary | ICD-10-CM | POA: Diagnosis not present

## 2021-08-21 DIAGNOSIS — M6751 Plica syndrome, right knee: Secondary | ICD-10-CM | POA: Diagnosis not present

## 2021-08-21 DIAGNOSIS — M65861 Other synovitis and tenosynovitis, right lower leg: Secondary | ICD-10-CM | POA: Diagnosis not present

## 2021-08-21 DIAGNOSIS — S83241A Other tear of medial meniscus, current injury, right knee, initial encounter: Secondary | ICD-10-CM | POA: Diagnosis not present

## 2021-08-21 DIAGNOSIS — X58XXXA Exposure to other specified factors, initial encounter: Secondary | ICD-10-CM | POA: Diagnosis not present

## 2021-08-21 HISTORY — PX: KNEE ARTHROSCOPY: SHX127

## 2021-08-23 ENCOUNTER — Encounter (HOSPITAL_BASED_OUTPATIENT_CLINIC_OR_DEPARTMENT_OTHER): Payer: Self-pay

## 2021-08-23 DIAGNOSIS — I2510 Atherosclerotic heart disease of native coronary artery without angina pectoris: Secondary | ICD-10-CM

## 2021-08-23 MED ORDER — ATORVASTATIN CALCIUM 40 MG PO TABS: ORAL_TABLET | ORAL | 3 refills | Status: DC

## 2021-08-27 DIAGNOSIS — M25661 Stiffness of right knee, not elsewhere classified: Secondary | ICD-10-CM | POA: Diagnosis not present

## 2021-08-27 DIAGNOSIS — Z9889 Other specified postprocedural states: Secondary | ICD-10-CM | POA: Diagnosis not present

## 2021-09-03 ENCOUNTER — Other Ambulatory Visit (HOSPITAL_COMMUNITY): Payer: Medicare Other

## 2021-10-01 ENCOUNTER — Other Ambulatory Visit (HOSPITAL_COMMUNITY): Payer: Medicare Other

## 2021-10-02 ENCOUNTER — Telehealth (HOSPITAL_COMMUNITY): Payer: Self-pay | Admitting: Cardiology

## 2021-10-02 NOTE — Telephone Encounter (Signed)
Patient cancelled stress echocardiogram for the reason below: ? ?09/25/2021 11:20 AM [id number], Erik Leon  ?Cancel Rsn: Patient (Patient recently had knee surgery) ? ?Order will be removed from the active [place] and when patient calls back to reschedule we will reinstate the order.  ?

## 2021-10-13 ENCOUNTER — Encounter: Payer: Self-pay | Admitting: Family Medicine

## 2021-10-13 NOTE — Progress Notes (Signed)
?Chief Complaint  ?Patient presents with  ? Medicare Wellness  ?  Fasting AWV/CPE. Had R knee meniscal/ligament repair 08/21/21 Dr. Ronnie Derby. He is going to schedule with [facility]. Thinks he may have a hernia would like you to take a peek. Would like a referral to Dr Carlean Purl as Dr. Earlean Shawl has retired-his upper abd area stays bloated. Would like referral to a nutritionist for weightloss. No other concerns.   ? ? ?Erik Leon is a 71 y.o. male who presents for annual wellness visit and follow-up on chronic medical conditions.   ?He has the following concerns: ? ?Lump in R breast area x 2 months.  Not painful to touch, sometimes notes a discomfort with certain movements.  Not even sure if it is a lump, "just feels different". ? ?He is having pain at the R lower abdomen, feels "like something is caught", sometimes across the whole abdomen. Feels a pressure/irritation intermittently.  Hasn't noticed any swelling ?Some discomfort at the groin, on swelling. Started after he fell in 04/2021 (slipped on wet ground, carrying 300# tree trunk, when he injured his R knee) ?Nurses told him to be checked for hernia after surgery (??) ? ?Impaired fasting glucose:  Last A1c was 6.0% in 04/2021 (down from 6.2% in July, which was up from 5.8% in 07/2020). 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  He had prolonged lethargy after COVID, but was improved at last visit. He never went to [facility] due to his fall in 04/2021 with his knee injury. ?He was given a 2 week trial of [employer].  He noted that soda drove it up, so he has since cut out soda (it was diet soda). Can't recall anything else raising sugars, or remember values. ?He reports not eating a lot of food, eating very healthy.  Admits his downfall is sweets.  He is baking a lot with his granddaughter. ?He is asking to see nutritionist (not covered by medicare). ? ?He recently had  arthroscopic knee surgery R knee 08/21/2021. He reports having some nerve damage and ongoing discomfort.  Recovering slower than expected. He is under the care of Dr. Ronnie Derby. Using cane still. ? ?Patient is under the care of cardiologist, who treats his HTN, lipids. Has h/o mild hypertrophic obstructive cardiomyopathy, nonobstructive CAD.  Elevated CA score 02/2018. Last echo was 12/2020. ? ?HTN--reports compliance with valsartan (changed from lisinopril in 07/2020 by cardiologist due to cough, which resolved with change).   ?He denies headaches. BP's at home after work range from 102/57 to 110/75-80. ?He no longer has bilateral edema like he did in 12/2020. He reports still having some mild edema on the RLE only (the one that had injury/surgery), even when he wakes up in the morning. Sleeps with his legs elevated. No swelling on the left. ?Compliant with lasix daily.  Last echo was 12/2020. ?Denies any DOE, no chest pain. 2 days ago he got dizzy when he stood up quickly.   ? ?Coronary and aortic atherosclerosis were noted on CT angio done in ER 01/2021. ?He was scheduled for stress echo, but this was cancelled due to his knee surgery.  Plans to r/s. ?  ?Hyperlipidemia:  He is compliant with atorvastatin '40mg'$  daily. Denies side effects. This is prescribed by cardiologist, monitored/labs by PCP. Due for lipids. ?He tries to follow a low cholesterol diet. ?Lab Results  ?Component Value Date  ? CHOL 140 08/02/2020  ? HDL  51 08/02/2020  ? [place] 75 08/02/2020  ? TRIG 86 01/22/2021  ? CHOLHDL 2.7 08/02/2020  ? ?Vitamin D deficiency--Level was low at 16.8 in 06/2017. Last level was normal at 32.8 in 07/2020.  He took it daily until his surgery 08/21/21.  He hasn't taken any since then. ? ?H/o elevated PSA. He previously saw urologist, had benign biopsies in the past. We have discussed prostate cancer screening with him at every yearly visit.  He declines prostate exam, and states he will f/u with urologist, requests [place] to check PSA.   He still has not seen urologist (last was in 2017, per our records).  ? ?GERD:  He is taking 2 of the OTC Nexium BID regularly. This doesn't seem to work as well as in the past. He occasionally will feel like food gets stuck. Not eating steak, so not as noticeable. He has to be careful with eating, no major problems. ?Previously had dilatation by Dr. Earlean Shawl in the past. ?Dr. Earlean Shawl retired. Requesting referral to Dr. Carlean Purl.  He is due for another colonoscopy as well (see below). ?  ?Immunization History  ?Administered Date(s) Administered  ? Fluad Quad(high Dose 65+) 06/09/2019  ? Influenza, High Dose Seasonal PF 07/13/2017  ? Janssen (J&J) SARS-COV-2 Vaccination 02/20/2020  ? Pneumococcal Conjugate-13 02/21/2016  ? Pneumococcal Polysaccharide-23 07/13/2017  ? Tdap 10/16/2010  ? Zoster, Live 10/29/2011  ? ?Pt refuses any COVID boosters.  Just lost his brother to [place] (a month after his 2nd vaccine). ?Last colonoscopy: 10/2020--poor prep, needs to repeat.  Prior colonoscopy was 06/2017, adenomatous colon polyps and internal hemorrhoids (3 yr f/u rec). ?Last PSA:  ?Lab Results  ?Component Value Date  ? PSA1 4.2 (H) 08/02/2020  ? PSA1 4.1 (H) 06/07/2019  ? PSA 6.59 (H) 07/19/2015  ? PSA 4.29 (H) 11/01/2012  ? PSA 4.63 (H) 11/01/2012  ? ?Dentist: twice yearly  ?Ophtho: yearly  ?Exercise: under-stable type of bike, for about an hour/day (25 mins at a time). 5# dumbbells every morning. ? ? ?Patient Care Team: ?Rita Ohara, MD as PCP - General (Family Medicine) ?Buford Dresser, MD as PCP - Cardiology (Cardiology) ?Ophtho: [facility]--switching to [facility] ?Dentist: Dr. Pablo Lawrence ?GI: Dr. Earlean Shawl (retired) ?Derm: Dr. Denna Haggard ?Podiatry: Dr. Amalia Hailey ?Cardiology: Dr. Harrell Gave ?ENT: Dr. Benjamine Mola ?Ortho: Dr. Ronnie Derby (prev went to [facility]) ?Urology: Dr. Louis Meckel ? ?Depression Screening: ?[facility] Office Visit from 10/15/2021 in [facility]  ?PHQ-2 Total Score 0  ? ?  ?  ? ?Falls screen:  ? ?  10/15/2021  ?   8:48 AM 05/06/2021  ? 11:45 AM 12/31/2020  ?  2:41 PM 08/02/2020  ?  3:15 PM 06/09/2019  ?  2:42 PM  ?Fall Risk   ?Falls in the past year? 1 0 0 0 0  ?Number falls in past yr: 0 0 0    ?Comment 11/22      ?Injury with Fall? 1 0 0    ?Comment right knee meniscal tear      ?Risk for fall due to : History of fall(s) No Fall Risks No Fall Risks    ?Follow up Falls evaluation completed Falls evaluation completed Falls evaluation completed    ?  ? ?Functional Status Survey: ?Is the patient deaf or have difficulty hearing?: Yes ?Does the patient have difficulty seeing, even when wearing glasses/contacts?: Yes (thinks he is starting to develop cataract) ?Does the patient have difficulty concentrating, remembering, or making decisions?: No ?Does the patient have difficulty walking or  climbing stairs?: Yes (trouble due to R knee surgery) ?Does the patient have difficulty dressing or bathing?: No ?Does the patient have difficulty doing errands alone such as visiting a doctor's office or shopping?: No ? ?Mini-Cog Scoring: 5  ?  ?He saw Dr. Benjamine Mola in the past, not bad enough for hearing aids. ? ? ?He has a living will and healthcare power of attorney. He has been working with 2 attorneys, and has this completed. He will get [place] copies. ? ?  ?PMH, PSH, SH and FH were updated and reviewed ? ?Outpatient Encounter Medications as of 10/15/2021  ?Medication Sig Note  ? aspirin 81 MG tablet Take 81 mg by mouth daily.   ? atorvastatin (LIPITOR) 40 MG tablet TAKE 1 TABLET(40 MG) BY MOUTH DAILY AT 6 PM   ? esomeprazole (NEXIUM) 40 MG capsule Take 40 mg by mouth 2 (two) times daily before a meal.   ? fexofenadine (ALLEGRA) 180 MG tablet Take 180 mg by mouth daily.   ? furosemide (LASIX) 40 MG tablet Take 1 tablet (40 mg total) by mouth daily.   ? valsartan (DIOVAN) 80 MG tablet Take 1 tablet (80 mg total) by mouth daily.   ? cholecalciferol (VITAMIN D) 1000 units tablet Take 1,000 Units by mouth daily. (Patient not taking: Reported on  10/15/2021) 10/15/2021: Has not since 08/21/21  ? ?No facility-administered encounter medications on file as of 10/15/2021.  ? ?Allergies  ?Allergen Reactions  ? Coconut Fatty Acids Swelling  ? ? ? ?ROS: The patient d

## 2021-10-13 NOTE — Patient Instructions (Addendum)
?  HEALTH MAINTENANCE RECOMMENDATIONS: ? ?It is recommended that you get at least 30 minutes of aerobic exercise at least 5 days/week (for weight loss, you may need as much as 60-90 minutes). This can be any activity that gets your heart rate up. This can be divided in 10-15 minute intervals if needed, but try and build up your endurance at least once a week.  Weight bearing exercise is also recommended twice weekly. ? ?Eat a healthy diet with lots of vegetables, fruits and fiber.  "Colorful" foods have a lot of vitamins (ie green vegetables, tomatoes, red peppers, etc).  Limit sweet tea, regular sodas and alcoholic beverages, all of which has a lot of calories and sugar.  Up to 2 alcoholic drinks daily may be beneficial for men (unless trying to lose weight, watch sugars).  Drink a lot of water. ? ?Sunscreen of at least SPF 30 should be used on all sun-exposed parts of the skin when outside between the hours of 10 am and 4 pm (not just when at beach or pool, but even with exercise, golf, tennis, and yard work!)  Use a sunscreen that says "broad spectrum" so it covers both UVA and UVB rays, and make sure to reapply every 1-2 hours. ? ?Remember to change the batteries in your smoke detectors when changing your clock times in the spring and fall.  Carbon monoxide detectors are recommended for your home. ? ?Use your seat belt every time you are in a car, and please drive safely and not be distracted with cell phones and texting while driving. ? ? ? ?[name] , ?Thank you for taking time to come for your Medicare Wellness Visit. I appreciate your ongoing commitment to your health goals. Please review the following plan we discussed and let me know if I can assist you in the future.  ? ?This is a list of the screening recommended for you and due dates:  ?Health Maintenance  ?Topic Date Due  ? Zoster (Shingles) Vaccine (1 of 2) Never done  ? COVID-19 Vaccine (2 - Janssen risk series) 03/19/2020  ? Tetanus Vaccine   10/15/2020  ? Flu Shot  01/21/2022  ? Colon Cancer Screening  Follow-up due, last had poor prep  ? Pneumonia Vaccine  Completed  ? Hepatitis C Screening: USPSTF Recommendation to screen - Ages 37-79 yo.  Completed  ? HPV Vaccine  Aged Out  ? ? ?Please get tetanus booster (TdaP) from the pharmacy. ? ?I recommend getting the new shingles vaccine (Shingrix). Since you have Medicare, you will need to get this from the pharmacy, as it is covered by Part D. ?This is a series of 2 injections, spaced 2 months apart.   ?This should be separated from other vaccines by at least 2 weeks. ? ?Bivalent COVID booster is recommended. ? ?Please bring [place] copies of your Living Will and [place] so that it can be scanned into your medical chart. ? ?We are referring you to [facility]. ?Please keep a food journal for at least a few days/week prior to seeing them. ?

## 2021-10-15 ENCOUNTER — Ambulatory Visit (INDEPENDENT_AMBULATORY_CARE_PROVIDER_SITE_OTHER): Payer: Medicare Other | Admitting: Family Medicine

## 2021-10-15 ENCOUNTER — Encounter: Payer: Self-pay | Admitting: Family Medicine

## 2021-10-15 ENCOUNTER — Other Ambulatory Visit: Payer: Self-pay | Admitting: *Deleted

## 2021-10-15 ENCOUNTER — Other Ambulatory Visit: Payer: Self-pay | Admitting: Family Medicine

## 2021-10-15 VITALS — BP 128/70 | HR 60 | Ht 69.0 in | Wt 246.8 lb

## 2021-10-15 DIAGNOSIS — Z0000 Encounter for general adult medical examination without abnormal findings: Secondary | ICD-10-CM | POA: Diagnosis not present

## 2021-10-15 DIAGNOSIS — E7800 Pure hypercholesterolemia, unspecified: Secondary | ICD-10-CM

## 2021-10-15 DIAGNOSIS — I700 Atherosclerosis of aorta: Secondary | ICD-10-CM

## 2021-10-15 DIAGNOSIS — I421 Obstructive hypertrophic cardiomyopathy: Secondary | ICD-10-CM

## 2021-10-15 DIAGNOSIS — K219 Gastro-esophageal reflux disease without esophagitis: Secondary | ICD-10-CM

## 2021-10-15 DIAGNOSIS — R1031 Right lower quadrant pain: Secondary | ICD-10-CM

## 2021-10-15 DIAGNOSIS — N644 Mastodynia: Secondary | ICD-10-CM

## 2021-10-15 DIAGNOSIS — R140 Abdominal distension (gaseous): Secondary | ICD-10-CM

## 2021-10-15 DIAGNOSIS — R7301 Impaired fasting glucose: Secondary | ICD-10-CM | POA: Diagnosis not present

## 2021-10-15 DIAGNOSIS — E559 Vitamin D deficiency, unspecified: Secondary | ICD-10-CM

## 2021-10-15 DIAGNOSIS — Z6836 Body mass index (BMI) 36.0-36.9, adult: Secondary | ICD-10-CM

## 2021-10-15 DIAGNOSIS — Z860101 Personal history of adenomatous and serrated colon polyps: Secondary | ICD-10-CM

## 2021-10-15 DIAGNOSIS — I2510 Atherosclerotic heart disease of native coronary artery without angina pectoris: Secondary | ICD-10-CM

## 2021-10-15 DIAGNOSIS — N62 Hypertrophy of breast: Secondary | ICD-10-CM

## 2021-10-15 DIAGNOSIS — Z86010 Personal history of colonic polyps: Secondary | ICD-10-CM

## 2021-10-15 DIAGNOSIS — Z5181 Encounter for therapeutic drug level monitoring: Secondary | ICD-10-CM | POA: Diagnosis not present

## 2021-10-15 DIAGNOSIS — I10 Essential (primary) hypertension: Secondary | ICD-10-CM | POA: Diagnosis not present

## 2021-10-15 DIAGNOSIS — Z7185 Encounter for immunization safety counseling: Secondary | ICD-10-CM

## 2021-10-15 LAB — POCT GLYCOSYLATED HEMOGLOBIN (HGB A1C): Hemoglobin A1C: 6.2 % — AB (ref 4.0–5.6)

## 2021-10-16 LAB — CBC WITH DIFFERENTIAL/PLATELET
Basophils Absolute: 0.1 10*3/uL (ref 0.0–0.2)
Basos: 1 %
EOS (ABSOLUTE): 0.3 10*3/uL (ref 0.0–0.4)
Eos: 3 %
Hematocrit: 42.5 % (ref 37.5–51.0)
Hemoglobin: 14.2 g/dL (ref 13.0–17.7)
Immature Grans (Abs): 0 10*3/uL (ref 0.0–0.1)
Immature Granulocytes: 0 %
Lymphocytes Absolute: 2 10*3/uL (ref 0.7–3.1)
Lymphs: 24 %
MCH: 26.7 pg (ref 26.6–33.0)
MCHC: 33.4 g/dL (ref 31.5–35.7)
MCV: 80 fL (ref 79–97)
Monocytes Absolute: 0.7 10*3/uL (ref 0.1–0.9)
Monocytes: 8 %
Neutrophils Absolute: 5 10*3/uL (ref 1.4–7.0)
Neutrophils: 64 %
Platelets: 199 10*3/uL (ref 150–450)
RBC: 5.31 x10E6/uL (ref 4.14–5.80)
RDW: 13.2 % (ref 11.6–15.4)
WBC: 8 10*3/uL (ref 3.4–10.8)

## 2021-10-16 LAB — COMPREHENSIVE METABOLIC PANEL
ALT: 19 IU/L (ref 0–44)
AST: 18 IU/L (ref 0–40)
Albumin/Globulin Ratio: 2 (ref 1.2–2.2)
Albumin: 4.5 g/dL (ref 3.7–4.7)
Alkaline Phosphatase: 107 IU/L (ref 44–121)
BUN/Creatinine Ratio: 12 (ref 10–24)
BUN: 17 mg/dL (ref 8–27)
Bilirubin Total: 0.6 mg/dL (ref 0.0–1.2)
CO2: 25 mmol/L (ref 20–29)
Calcium: 9.3 mg/dL (ref 8.6–10.2)
Chloride: 101 mmol/L (ref 96–106)
Creatinine, Ser: 1.42 mg/dL — ABNORMAL HIGH (ref 0.76–1.27)
Globulin, Total: 2.2 g/dL (ref 1.5–4.5)
Glucose: 114 mg/dL — ABNORMAL HIGH (ref 70–99)
Potassium: 4.6 mmol/L (ref 3.5–5.2)
Sodium: 140 mmol/L (ref 134–144)
Total Protein: 6.7 g/dL (ref 6.0–8.5)
eGFR: 53 mL/min/{1.73_m2} — ABNORMAL LOW (ref >59)

## 2021-10-16 LAB — LIPID PANEL
Chol/HDL Ratio: 2.8 ratio (ref 0.0–5.0)
Cholesterol, Total: 116 mg/dL (ref 100–199)
HDL: 42 mg/dL (ref >39)
LDL Chol Calc (NIH): 57 mg/dL (ref 0–99)
Triglycerides: 87 mg/dL (ref 0–149)
VLDL Cholesterol Cal: 17 mg/dL (ref 5–40)

## 2021-10-16 LAB — VITAMIN D 25 HYDROXY (VIT D DEFICIENCY, FRACTURES): Vit D, 25-Hydroxy: 47.5 ng/mL (ref 30.0–100.0)

## 2021-10-17 ENCOUNTER — Ambulatory Visit
Admission: RE | Admit: 2021-10-17 | Discharge: 2021-10-17 | Disposition: A | Discharge status: Home / Self Care | Payer: Medicare Other | Source: Ambulatory Visit | Attending: Family Medicine | Admitting: Family Medicine

## 2021-10-17 DIAGNOSIS — R140 Abdominal distension (gaseous): Secondary | ICD-10-CM

## 2021-10-17 DIAGNOSIS — R188 Other ascites: Secondary | ICD-10-CM | POA: Diagnosis not present

## 2021-10-17 DIAGNOSIS — R1031 Right lower quadrant pain: Secondary | ICD-10-CM

## 2021-10-17 DIAGNOSIS — R109 Unspecified abdominal pain: Secondary | ICD-10-CM | POA: Diagnosis not present

## 2021-10-29 ENCOUNTER — Ambulatory Visit
Admission: RE | Admit: 2021-10-29 | Discharge: 2021-10-29 | Disposition: A | Discharge status: Home / Self Care | Payer: Medicare Other | Source: Ambulatory Visit | Attending: Family Medicine | Admitting: Family Medicine

## 2021-10-29 ENCOUNTER — Encounter: Payer: Self-pay | Admitting: Family Medicine

## 2021-10-29 ENCOUNTER — Ambulatory Visit: Payer: Medicare Other

## 2021-10-29 DIAGNOSIS — N62 Hypertrophy of breast: Secondary | ICD-10-CM | POA: Diagnosis not present

## 2021-10-29 DIAGNOSIS — N644 Mastodynia: Secondary | ICD-10-CM

## 2021-11-04 ENCOUNTER — Other Ambulatory Visit: Payer: Medicare Other

## 2022-02-05 ENCOUNTER — Encounter (HOSPITAL_BASED_OUTPATIENT_CLINIC_OR_DEPARTMENT_OTHER): Payer: Self-pay

## 2022-02-05 DIAGNOSIS — R011 Cardiac murmur, unspecified: Secondary | ICD-10-CM | POA: Insufficient documentation

## 2022-02-05 DIAGNOSIS — T7840XA Allergy, unspecified, initial encounter: Secondary | ICD-10-CM | POA: Insufficient documentation

## 2022-02-05 DIAGNOSIS — K635 Polyp of colon: Secondary | ICD-10-CM | POA: Insufficient documentation

## 2022-02-05 DIAGNOSIS — G43909 Migraine, unspecified, not intractable, without status migrainosus: Secondary | ICD-10-CM | POA: Insufficient documentation

## 2022-02-05 DIAGNOSIS — K222 Esophageal obstruction: Secondary | ICD-10-CM | POA: Insufficient documentation

## 2022-02-06 ENCOUNTER — Ambulatory Visit (INDEPENDENT_AMBULATORY_CARE_PROVIDER_SITE_OTHER): Payer: Medicare Other | Admitting: Cardiology

## 2022-02-06 ENCOUNTER — Encounter (HOSPITAL_BASED_OUTPATIENT_CLINIC_OR_DEPARTMENT_OTHER): Payer: Self-pay | Admitting: Cardiology

## 2022-02-06 VITALS — BP 124/74 | HR 76 | Ht 69.0 in | Wt 250.0 lb

## 2022-02-06 DIAGNOSIS — I2510 Atherosclerotic heart disease of native coronary artery without angina pectoris: Secondary | ICD-10-CM

## 2022-02-06 DIAGNOSIS — R002 Palpitations: Secondary | ICD-10-CM | POA: Diagnosis not present

## 2022-02-06 DIAGNOSIS — R600 Localized edema: Secondary | ICD-10-CM

## 2022-02-06 DIAGNOSIS — I422 Other hypertrophic cardiomyopathy: Secondary | ICD-10-CM | POA: Diagnosis not present

## 2022-02-06 DIAGNOSIS — M1711 Unilateral primary osteoarthritis, right knee: Secondary | ICD-10-CM | POA: Diagnosis not present

## 2022-02-06 DIAGNOSIS — Z4789 Encounter for other orthopedic aftercare: Secondary | ICD-10-CM | POA: Diagnosis not present

## 2022-02-06 DIAGNOSIS — Z7189 Other specified counseling: Secondary | ICD-10-CM | POA: Diagnosis not present

## 2022-02-06 DIAGNOSIS — Z9889 Other specified postprocedural states: Secondary | ICD-10-CM | POA: Diagnosis not present

## 2022-02-06 NOTE — Patient Instructions (Signed)

## 2022-02-06 NOTE — Progress Notes (Signed)
Cardiology Office Note:    Date:  02/06/2022   ID:  Erik Leon, DOB 1951/04/07, MRN [id number]  PCP:  Rita Ohara, MD  Cardiologist:  Buford Dresser, MD PhD  Referring MD: Rita Ohara, MD   CC: follow up  History of Present Illness:    Erik Leon is a 71 y.o. male with a hx of hypertension and mild HCM who is seen for follow up. He was initially seen as a new consult at the request of Dr. Tomi Bamberger for evaluation of chest pain 02/01/18.  Cardiac history: Chest pain 2019: intermittent. No aggravating factors. Last 10 seconds. If he exerts himself, feels that his pulse goes faster, but doesn't get discomfort. Started several years ago. Happens once every couple months. Last occurrence two weeks ago, occurred while sitting at his desk. Feels like when "you swallow something and it goes down the wrong pipe." Feels like it is under his heart. No radiation, no associated symptoms. He reports multiple prior stress tests, though I do not have the records. Noted as stress in Epic in 2013, but only normal echo results attached.  FH: no known CAD. Brother with congenital cardiac defect (below), may have passed from SCD.  HCM: echo in 2013 pdf reports SAM, though I cannot see additional details. Noted as mild LVH, I do not have measurements or gradients. Echo here in 2017 noted septal thickness as 1.5 cm and posterior wall thickness as 1.37 cm. No gradient noted.   At his last appointment he reported an isolated incident of syncope in 01/2021, attributable to losing his breath after excessive coughing in the setting of COVID-19 infection. He complained of chest discomfort sometimes with associated hot flashes and dizziness. He noted more frequent leg cramps that improved with eating more potassium rich foods.  Today: He had right knee surgery 08/2021. His recovery has been complicated by an arthritic cyst on his knee. It was present prior to surgery, and became exacerbated post-op. His RLE  edema has been intermittent since his surgery. Confirms worse edema with consuming a lot of salty foods.  Occasionally, he has episodes of heart flutters and dizziness. This may occur 2-3 times a month, for about 3-4 seconds. These episodes have occurred while he is sitting or standing.  For exercise, he uses a treadmill at home.  He denies any chest pain, shortness of breath. No headaches, syncope, orthopnea, or PND.   Past Medical History:  Diagnosis Date   Allergy    Atypical mole 11/23/2000   slight to moderate- Right upper back (WS)   Atypical mole 11/23/2000   slight to moderate-right uppermost inner thigh (WS)   Atypical mole 10/24/2009   severe-right mid abdomen (EXC)   Atypical mole 01/06/2019   left mi back    Basal cell carcinoma 09/14/2012   superficial-left upper shoulder (txpbx)   Basal cell carcinoma 11/26/2017   sup + nod-right chest (CX35FU)   Basal cell carcinoma 01/06/2019   sup-right post shoulder (CX35FU)   BCC (basal cell carcinoma of skin) 10/24/2009   top right shoulder (CX35FU)   Calcaneal fracture 2014   right; s/p boot; reinjured in 2019   Colon polyp    Diverticulosis 6/09   rare L colon diverticuli   Elevated PSA 10/2012   benign prostate biopsies 11/2012   Esophageal stricture    s/p dilatation (Dr. Earlean Shawl)   GERD (gastroesophageal reflux disease)    Heart murmur    Hypertension    Hypertr obst cardiomyop  mild--Dr. Radford Pax (previously)   Internal hemorrhoid 6/09   Migraine age 77   resolved after nasal surgery   Nodular basal cell carcinoma (BCC) 11/18/2019   Mid Bridge Nose    Past Surgical History:  Procedure Laterality Date   CHOLECYSTECTOMY  10/2001   COLONOSCOPY  11/2007, 2011, 2014   Dr. Rudi Rummage again 2019   dental implant     ELBOW SURGERY Right 06/2013   bone spur; Dr. Percell Miller   ESOPHAGOGASTRODUODENOSCOPY  2012   with dilatation.  Dr. Earlean Shawl   KNEE ARTHROSCOPY     KNEE ARTHROSCOPY Right 08/21/2021   Dr. Ronnie Derby    LIPOMA EXCISION     Dr. Stanford Breed from L paramedian prevertebral space   NASAL POLYP SURGERY  2008   SHOULDER ARTHROSCOPY  12/03; 1/06   right; left    Current Medications: Current Outpatient Medications on File Prior to Visit  Medication Sig   aspirin 81 MG tablet Take 81 mg by mouth daily.   atorvastatin (LIPITOR) 40 MG tablet TAKE 1 TABLET(40 MG) BY MOUTH DAILY AT 6 PM   cholecalciferol (VITAMIN D) 1000 units tablet Take 1,000 Units by mouth daily.   esomeprazole (NEXIUM) 40 MG capsule Take 40 mg by mouth 2 (two) times daily before a meal.   fexofenadine (ALLEGRA) 180 MG tablet Take 180 mg by mouth daily.   furosemide (LASIX) 40 MG tablet Take 1 tablet (40 mg total) by mouth daily.   ibuprofen (ADVIL) 600 MG tablet Take 600 mg by mouth as needed.   valsartan (DIOVAN) 80 MG tablet Take 1 tablet (80 mg total) by mouth daily.   No current facility-administered medications on file prior to visit.     Allergies:   Coconut fatty acids   Social History   Tobacco Use   Smoking status: Former    Types: Cigarettes    Quit date: 06/24/1983    Years since quitting: 38.6   Smokeless tobacco: Never  Vaping Use   Vaping Use: Never used  Substance Use Topics   Alcohol use: Yes    Comment: 1-2 drink/month (scotch) or less, none since January   Drug use: No    Family History: The patient's family history includes Alcohol abuse in his brother and father; Cancer in his mother; Cirrhosis in his brother; Dementia in his mother; Hypertension in his mother; Liver cancer in his brother. There is no history of Diabetes, Prostate cancer, or Colon cancer. Half-brother had a birth defect and died after playing handball at age 6. Not HCM as far as he knows. There were 11 sons, no other with heart problems. Not sure about his dad's history, he was an alcoholic. No MI or stroke.  ROS:   Please see the history of present illness.   (+) Palpitations (+) Dizziness (+) Bilateral LE edema  R>L Additional pertinent ROS otherwise unremarkable.  EKGs/Labs/Other Studies Reviewed:    The following studies were reviewed today:  CTA Chest 01/22/2021: COMPARISON:  Coronary chest CT from 03/03/2018   FINDINGS: Cardiovascular: Satisfactory opacification of the pulmonary arteries to the segmental level. No evidence of pulmonary embolism. Normal heart size. No pericardial effusion. Coronary atherosclerotic calcifications. Mild scattered thoracic aortic atherosclerotic calcifications.   Mediastinum/Nodes: Left hilar and left AP window calcified lymph nodes. No pathologically enlarged mediastinal or hilar lymphadenopathy. No axillary or supraclavicular lymphadenopathy. The thyroid gland, trachea, and esophagus are within normal limits.   Lungs/Pleura: An azygos fissure is noted. Mild lingular and left lower lobar cicatricial atelectasis, unchanged. Minimal bibasilar  subsegmental atelectasis. No focal consolidations. No suspicious pulmonary nodules. No pleural effusion or pneumothorax.   Upper Abdomen: Diffusely decreased attenuation of the hepatic parenchyma. Status post cholecystectomy. The remaining visualized upper abdomen is within normal limits.   Musculoskeletal: Mild multilevel degenerative changes of the thoracic spine. No acute osseous abnormality.   Review of the MIP images confirms the above findings.   IMPRESSION: Vascular:   1. No pulmonary embolism. 2. Coronary and aortic atherosclerosis (ICD10-I70.0).   Non-Vascular:   1.   Mild bibasilar subsegmental atelectasis.   2. Similar appearing cicatricial atelectasis in the left lower lobe and lingula.   3.   Evidence of prior granulomatous disease.   4.   Hepatic steatosis.  Echo 01/03/2021:  1. Left ventricular ejection fraction, by estimation, is 60 to 65%. The  left ventricle has normal function. The left ventricle has no regional  wall motion abnormalities. There is moderate left ventricular  hypertrophy  of the basal-septal segment. Left  ventricular diastolic parameters are consistent with Grade I diastolic  dysfunction (impaired relaxation). Elevated left ventricular end-diastolic  pressure.   2. Right ventricular systolic function is normal. The right ventricular  size is normal.   3. The mitral valve is normal in structure. Trivial mitral valve  regurgitation. No evidence of mitral stenosis.   4. The aortic valve is normal in structure. Aortic valve regurgitation is  not visualized. No aortic stenosis is present.   5. The inferior vena cava is normal in size with greater than 50%  respiratory variability, suggesting right atrial pressure of 3 mmHg.   CT cardiac 03/03/2018: FINDINGS: The pulmonary veins drained normally to the left atrium. Calcium Score: 198 Agatston units Coronary Arteries: Right dominant with no anomalies LM: Short, no plaque or stenosis. LAD system: Relatively small caliber vessel. Mixed plaque proximal and mid LAD, appears to be mild stenosis. Circumflex system: Large LCx. Mixed plaque mid vessel without significant stenosis. RCA system: Calcified plaque distal RCA/PLV with mild stenosis.   IMPRESSION: 1. Coronary artery calcium score 198 Agatston units. This places the patient in the 65th percentile for age and gender, suggesting intermediate risk for future cardiac events. 2. Extensive plaque in the proximal and mid LAD. Appears to be mild stenosis. 3.  Mild stenosis in distal RCA/PLV.  FFR without hemodynamically significant stenosis  Echo 2017:  Study Conclusions  - Left ventricle: The cavity size was normal. Wall thickness was   increased in a pattern of mild LVH. Systolic function was normal.   The estimated ejection fraction was in the range of 60% to 65%.   Wall motion was normal; there were no regional wall motion   abnormalities. Doppler parameters are consistent with abnormal   left ventricular relaxation (grade 1 diastolic  dysfunction). - Aortic valve: There was no stenosis. - Mitral valve: There was trivial regurgitation. - Right ventricle: The cavity size was normal. Systolic function   was normal. - Pulmonary arteries: No complete TR doppler jet so unable to   estimate PA systolic pressure. - Inferior vena cava: The vessel was normal in size. The   respirophasic diameter changes were in the normal range (>= 50%),   consistent with normal central venous pressure.   Impressions:   - Normal LV size with mild LV hypertrophy. EF 60-65%. Normal RV   size and systolic function. No significant valvular   abnormalities.  Echo 2013: (pdf scan)--noted as stress, but only resting echo in note EF >70%, mild cLVH. +SAM, trace MR  EKG:  ECG personally reviewed.  02/06/2022:  NSR at 76 bpm, similar ST pattern 05/13/2021: not ordered 04/17/20: NSR with nonspecific ST changes, similar to 2017  Recent Labs: 10/15/2021: ALT 19; BUN 17; Creatinine, Ser 1.42; Hemoglobin 14.2; Platelets 199; Potassium 4.6; Sodium 140   Recent Lipid Panel    Component Value Date/Time   CHOL 116 10/15/2021 1000   TRIG 87 10/15/2021 1000   HDL 42 10/15/2021 1000   CHOLHDL 2.8 10/15/2021 1000   CHOLHDL 4.0 02/21/2016 1035   VLDL 22 02/21/2016 1035   LDLCALC 57 10/15/2021 1000    Physical Exam:    VS:  BP 124/74 (BP Location: Right Arm, Patient Position: Sitting, Cuff Size: Large)   Pulse 76   Ht '5\' 9"'$  (1.753 m)   Wt 250 lb (113.4 kg)   BMI 36.92 kg/m     Wt Readings from Last 3 Encounters:  02/06/22 250 lb (113.4 kg)  10/15/21 246 lb 12.8 oz (111.9 kg)  05/13/21 239 lb (108.4 kg)   GEN: Well nourished, well developed in no acute distress HEENT: Normal, moist mucous membranes NECK: No JVD CARDIAC: regular rhythm, normal S1 and S2, no rubs or gallops. 3/6 machinelike systolic murmur. VASCULAR: Radial and DP pulses 2+ bilaterally. No carotid bruits RESPIRATORY:  Clear to auscultation without rales, wheezing or rhonchi   ABDOMEN: Soft, non-tender, non-distended MUSCULOSKELETAL:  Ambulates independently SKIN: Warm and dry, erythematous discoloration of RLE, + RLE pitting edema, trace LLE edema NEUROLOGIC:  Alert and oriented x 3. No focal neuro deficits noted. PSYCHIATRIC:  Normal affect   ASSESSMENT:    1. Hypertrophic cardiomyopathy ([place])   2. Heart palpitations   3. Nonocclusive coronary atherosclerosis of native coronary artery   4. Lower extremity edema   5. Counseling on health promotion and disease prevention    PLAN:    Bilateral LE edema -worse post op -has daily lasix, with some improvement. Avoid dehydration given HCM -exam looks like venous insufficiency. Discussed compression, elevation, salt avoidance  Nonobstructive CAD Hypercholesterolemia -coronary CT 02/2018 with Ca score 198, prox/mid LAD plaque but no significant stenosis by FFR -stress echo ordered previously for chest pain, but symptoms improved -continue atorvastatin -continue aspirin  Rare palpitations -will contact me if these worsen -ECG without change today  HCM Murmur -Predominantly focal septal hypertrophy. Max LVOT gradient noted on imaging was 12 mmHg. -asymptomatic, no syncope, no SCD in family clearly related to HCM (though half brother had congenital heart condition and died age 74 while exercising, which is concerning).  Hypertension:  -goal <130/80, at goal today -continue valsartan  Secondary prevention -recommend heart healthy/Mediterranean diet, with whole grains, fruits, vegetable, fish, lean meats, nuts, and olive oil. Limit salt. -recommend moderate walking, 3-5 times/week for 30-50 minutes each session. Aim for at least 150 minutes.week. Goal should be pace of 3 miles/hours, or walking 1.5 miles in 30 minutes -recommend avoidance of tobacco products. Avoid excess alcohol.  Plan for follow up: 6 mos or sooner PRN  Medication Adjustments/Labs and Tests Ordered: Current medicines are reviewed at  length with the patient today.  Concerns regarding medicines are outlined above.   Orders Placed This Encounter  Procedures   EKG 12-Lead   No orders of the defined types were placed in this encounter.  Patient Instructions  Medication Instructions:  Your Physician recommend you continue on your current medication as directed.    *If you need a refill on your cardiac medications before your next appointment, please call your pharmacy*   Lab  Work: None ordered today   Testing/Procedures: None ordered today   Follow-Up: At [facility], you and your health needs are our priority.  As part of our continuing mission to provide you with exceptional heart care, we have created designated Provider Care Teams.  These Care Teams include your primary Cardiologist (physician) and Advanced Practice Providers (APPs -  Physician Assistants and Nurse Practitioners) who all work together to provide you with the care you need, when you need it.  We recommend signing up for the patient portal called "MyChart".  Sign up information is provided on this After Visit Summary.  MyChart is used to connect with patients for Virtual Visits (Telemedicine).  Patients are able to view lab/test results, encounter notes, upcoming appointments, etc.  Non-urgent messages can be sent to your provider as well.   To learn more about what you can do with MyChart, go to [email or web address].    Your next appointment:   6 month(s)  The format for your next appointment:   In Person  Provider:   Buford Dresser, MD{         I,Mathew Stumpf,acting as a scribe for Buford Dresser, MD.,have documented all relevant documentation on the behalf of Buford Dresser, MD,as directed by  Buford Dresser, MD while in the presence of Buford Dresser, MD.  I, Buford Dresser, MD, have reviewed all documentation for this visit. The documentation on 02/06/22 for the exam, diagnosis,  procedures, and orders are all accurate and complete.   Signed, Buford Dresser, MD PhD 02/06/2022     [place]

## 2022-02-23 ENCOUNTER — Encounter: Payer: Self-pay | Admitting: Family Medicine

## 2022-02-26 ENCOUNTER — Encounter: Payer: Self-pay | Admitting: Internal Medicine

## 2022-03-06 ENCOUNTER — Ambulatory Visit (HOSPITAL_BASED_OUTPATIENT_CLINIC_OR_DEPARTMENT_OTHER): Payer: Medicare Other | Admitting: Cardiology

## 2022-03-11 ENCOUNTER — Ambulatory Visit (INDEPENDENT_AMBULATORY_CARE_PROVIDER_SITE_OTHER): Payer: Medicare Other | Admitting: Internal Medicine

## 2022-03-11 ENCOUNTER — Encounter (HOSPITAL_BASED_OUTPATIENT_CLINIC_OR_DEPARTMENT_OTHER): Payer: Self-pay | Admitting: Cardiology

## 2022-03-11 ENCOUNTER — Encounter: Payer: Self-pay | Admitting: Internal Medicine

## 2022-03-11 VITALS — BP 118/78 | HR 94 | Ht 70.0 in | Wt 246.6 lb

## 2022-03-11 DIAGNOSIS — R1319 Other dysphagia: Secondary | ICD-10-CM | POA: Diagnosis not present

## 2022-03-11 DIAGNOSIS — L298 Other pruritus: Secondary | ICD-10-CM | POA: Diagnosis not present

## 2022-03-11 DIAGNOSIS — Z789 Other specified health status: Secondary | ICD-10-CM | POA: Diagnosis not present

## 2022-03-11 DIAGNOSIS — Z08 Encounter for follow-up examination after completed treatment for malignant neoplasm: Secondary | ICD-10-CM | POA: Diagnosis not present

## 2022-03-11 DIAGNOSIS — L820 Inflamed seborrheic keratosis: Secondary | ICD-10-CM | POA: Diagnosis not present

## 2022-03-11 DIAGNOSIS — L814 Other melanin hyperpigmentation: Secondary | ICD-10-CM | POA: Diagnosis not present

## 2022-03-11 DIAGNOSIS — Z85828 Personal history of other malignant neoplasm of skin: Secondary | ICD-10-CM | POA: Diagnosis not present

## 2022-03-11 DIAGNOSIS — L821 Other seborrheic keratosis: Secondary | ICD-10-CM | POA: Diagnosis not present

## 2022-03-11 DIAGNOSIS — Z86010 Personal history of colonic polyps: Secondary | ICD-10-CM

## 2022-03-11 DIAGNOSIS — K649 Unspecified hemorrhoids: Secondary | ICD-10-CM | POA: Diagnosis not present

## 2022-03-11 DIAGNOSIS — D1801 Hemangioma of skin and subcutaneous tissue: Secondary | ICD-10-CM | POA: Diagnosis not present

## 2022-03-11 DIAGNOSIS — R0989 Other specified symptoms and signs involving the circulatory and respiratory systems: Secondary | ICD-10-CM

## 2022-03-11 DIAGNOSIS — R490 Dysphonia: Secondary | ICD-10-CM

## 2022-03-11 DIAGNOSIS — I2510 Atherosclerotic heart disease of native coronary artery without angina pectoris: Secondary | ICD-10-CM

## 2022-03-11 DIAGNOSIS — K219 Gastro-esophageal reflux disease without esophagitis: Secondary | ICD-10-CM

## 2022-03-11 DIAGNOSIS — R58 Hemorrhage, not elsewhere classified: Secondary | ICD-10-CM | POA: Diagnosis not present

## 2022-03-11 DIAGNOSIS — L538 Other specified erythematous conditions: Secondary | ICD-10-CM | POA: Diagnosis not present

## 2022-03-11 MED ORDER — PLENVU 140 G PO SOLR: 1.0000 | Freq: Once | ORAL | 0 refills | Status: AC

## 2022-03-11 NOTE — Progress Notes (Signed)
Erik Leon 71 y.o. January 25, 1951 [id number]  Assessment & Plan:   Encounter Diagnoses  Name Primary?   Esophageal dysphagia Yes   Gastroesophageal reflux disease, unspecified whether esophagitis present    Hoarse    Globus sensation    Hx of colonic polyps    Hemorrhoids    Evaluate recurrent dysphagia with EGD and plan for possible esophageal dilation.  Colonoscopy with double prep for history of colon polyps.  Chronic rectal bleeding seems to be hemorrhoidal in origin.  I have recommended he follow-up with Dr. Benjamine Mola regarding hoarseness.  He is already on twice daily PPI so that makes reflux as a cause much less likely.  The risks and benefits as well as alternatives of endoscopic procedure(s) have been discussed and reviewed. All questions answered. The patient agrees to proceed.  CC: Rita Ohara, MD   Subjective:   Chief Complaint: GERD questions and colonoscopy with history of colon polyps  HPI 71 year old white man previously followed by Dr. Earlie Raveling with a history of GERD and dysphagia and colon polyps.  He is here to discuss several issues.  He is having some hoarseness, globus sensation and feels like he needs to clear his throat but cannot do so.  He is also having recurrent dysphagia to solid food and sometimes pills.  Prior history of esophageal dilation for that in the past.  Records not available.  He has a history of a tumor resection by Dr. 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  He has not seen him in some time.  He is on twice daily generic Nexium.  He does not have classic heartburn symptoms much at all if at all.   He has a history of colon polyps and had a colonoscopy last year but had an inadequate prep.  He is not repeated and is requesting that I do that as well.  Dr. Earlean Shawl has retired.  2019 colonoscopy 5 polyps removed maximum 10 mm.  For cecal and 1 right colon.  Pathology was tubular adenoma on the right colon polyp, 2 tubular  adenomas and 1 sessile serrated adenoma/polyp on the cecal polyps and then benign colonic mucosa for the fourth cecal polyp.  Colonoscopy reports indicate prior colon polyps that were adenomatous as well though I do not have those specific records.  He is also complaining of holding onto a weight gain which has been aggravated by less ambulation after knee problems, and feeling full in his upper abdomen all the time.   Last echocardiogram July 2022 with normal EF and diastolic dysfunction no significant valvular lesions. Allergies  Allergen Reactions   Coconut Fatty Acids Swelling   Current Meds  Medication Sig   aspirin 81 MG tablet Take 81 mg by mouth daily.   atorvastatin (LIPITOR) 40 MG tablet TAKE 1 TABLET(40 MG) BY MOUTH DAILY AT 6 PM   cholecalciferol (VITAMIN D) 1000 units tablet Take 1,000 Units by mouth daily.   esomeprazole (NEXIUM) 40 MG capsule Take 40 mg by mouth 2 (two) times daily before a meal.   fexofenadine (ALLEGRA) 180 MG tablet Take 180 mg by mouth daily.       valsartan (DIOVAN) 80 MG tablet Take 1 tablet (80 mg total) by mouth daily.   Past Medical History:  Diagnosis Date   Allergy    Atypical mole 11/23/2000   slight to moderate- Right upper back (WS)   Atypical mole 11/23/2000   slight to moderate-right uppermost inner thigh (WS)   Atypical  mole 10/24/2009   severe-right mid abdomen (EXC)   Atypical mole 01/06/2019   left mi back    Basal cell carcinoma 09/14/2012   superficial-left upper shoulder (txpbx)   Basal cell carcinoma 11/26/2017   sup + nod-right chest (CX35FU)   Basal cell carcinoma 01/06/2019   sup-right post shoulder (CX35FU)   BCC (basal cell carcinoma of skin) 10/24/2009   top right shoulder (CX35FU)   Calcaneal fracture 2014   right; s/p boot; reinjured in 2019   Colon polyp    COVID 08/02/2018   three time also in januray 2023   Diverticulosis 11/2007   rare L colon diverticuli   Elevated PSA 10/2012   benign prostate biopsies  11/2012   Esophageal stricture    s/p dilatation (Dr. Earlean Shawl)   GERD (gastroesophageal reflux disease)    Heart murmur    Hypertension    Hypertr obst cardiomyop    mild--Dr. Radford Pax (previously)   Internal hemorrhoid 11/2007   Migraine age 85   resolved after nasal surgery   Nodular basal cell carcinoma ([place]) 11/18/2019   Mid Bridge Nose   Past Surgical History:  Procedure Laterality Date   CHOLECYSTECTOMY  10/2001   COLONOSCOPY  11/2007, 2011, 2014   Dr. Rudi Rummage again 2019   dental implant     ELBOW SURGERY Right 06/2013   bone spur; Dr. Percell Miller   ESOPHAGOGASTRODUODENOSCOPY  2012   with dilatation.  Dr. Earlean Shawl   KNEE ARTHROSCOPY     KNEE ARTHROSCOPY Right 08/21/2021   Dr. Ronnie Derby   LIPOMA EXCISION     Dr. Stanford Breed from L paramedian prevertebral space   NASAL POLYP SURGERY  2008   SHOULDER ARTHROSCOPY  12/03; 1/06   right; left   Social History   Social History Narrative   Divorced. 2 biologic children, a son and a daughter.  Adopted 3 children. 1 granddaughter Tanja [place], [place], born 06/2014, equestrian)   Lives alone, no pets.   family history includes Alcohol abuse in his brother and father; Cancer in his mother; Cirrhosis in his brother; Dementia in his mother; Hypertension in his mother; Liver cancer in his brother.   Review of Systems See HPI.  Patient is having knee pain on the right after surgery, there is some apparent nerve damage issues that is supposed to get better with time.  Objective:   Physical Exam '@BP'$  118/78   Pulse 94   Ht '5\' 10"'$  (1.778 m)   Wt 246 lb 9.6 oz (111.9 kg)   SpO2 95%   BMI 35.38 kg/m @  General:  Well-developed, well-nourished and in no acute distress Eyes:  anicteric. ENT:   Mouth and posterior pharynx free of lesions.  Lungs: Clear to auscultation bilaterally. Heart:   S1S2, with a 2/6 systolic ejection murmur. Abdomen:  Obese soft, non-tender, no hepatosplenomegaly, hernia, or mass and BS+.  Rectal: deferred  Neuro:   A&O x 3.  Psych:  appropriate mood and  Affect.   Data Reviewed: See HPI

## 2022-03-11 NOTE — Patient Instructions (Signed)
You have been scheduled for an endoscopy and colonoscopy. Please follow the written instructions given to you at your visit today. Please pick up your prep supplies at the pharmacy within the next 1-3 days. If you use inhalers (even only as needed), please bring them with you on the day of your procedure.  Please schedule your follow up with your ENT physician.   The [facility] providers would like to encourage you to use [facility] to communicate with providers for non-urgent requests or questions.  Due to long hold times on the telephone, sending your provider a message by [facility] may be a faster and more efficient way to get a response.  Please allow 48 business hours for a response.  Please remember that this is for non-urgent requests.   Due to recent changes in healthcare laws, you may see the results of your imaging and laboratory studies on MyChart before your provider has had a chance to review them.  We understand that in some cases there may be results that are confusing or concerning to you. Not all laboratory results come back in the same time frame and the provider may be waiting for multiple results in order to interpret others.  Please give [place] 48 hours in order for your provider to thoroughly review all the results before contacting the office for clarification of your results.

## 2022-03-17 DIAGNOSIS — M542 Cervicalgia: Secondary | ICD-10-CM | POA: Diagnosis not present

## 2022-03-17 DIAGNOSIS — M25522 Pain in left elbow: Secondary | ICD-10-CM | POA: Diagnosis not present

## 2022-03-17 DIAGNOSIS — M25561 Pain in right knee: Secondary | ICD-10-CM | POA: Diagnosis not present

## 2022-03-18 DIAGNOSIS — M25561 Pain in right knee: Secondary | ICD-10-CM | POA: Diagnosis not present

## 2022-03-24 DIAGNOSIS — M25561 Pain in right knee: Secondary | ICD-10-CM | POA: Diagnosis not present

## 2022-04-01 ENCOUNTER — Encounter: Payer: Self-pay | Admitting: Internal Medicine

## 2022-04-18 ENCOUNTER — Encounter: Payer: Self-pay | Admitting: Family Medicine

## 2022-04-23 ENCOUNTER — Encounter: Payer: Medicare Other | Admitting: Family Medicine

## 2022-04-25 ENCOUNTER — Encounter (HOSPITAL_BASED_OUTPATIENT_CLINIC_OR_DEPARTMENT_OTHER): Payer: Self-pay

## 2022-04-25 DIAGNOSIS — I2510 Atherosclerotic heart disease of native coronary artery without angina pectoris: Secondary | ICD-10-CM

## 2022-04-25 MED ORDER — ATORVASTATIN CALCIUM 40 MG PO TABS: ORAL_TABLET | ORAL | 3 refills | Status: DC

## 2022-04-26 ENCOUNTER — Encounter (HOSPITAL_BASED_OUTPATIENT_CLINIC_OR_DEPARTMENT_OTHER): Payer: Self-pay

## 2022-04-26 DIAGNOSIS — I10 Essential (primary) hypertension: Secondary | ICD-10-CM

## 2022-04-28 MED ORDER — VALSARTAN 80 MG PO TABS: 80.0000 mg | ORAL_TABLET | Freq: Every day | ORAL | 3 refills | Status: DC

## 2022-05-01 ENCOUNTER — Ambulatory Visit (AMBULATORY_SURGERY_CENTER): Payer: Medicare Other | Admitting: Internal Medicine

## 2022-05-01 ENCOUNTER — Encounter: Payer: Self-pay | Admitting: Internal Medicine

## 2022-05-01 VITALS — BP 110/44 | HR 76 | Temp 97.3°F | Resp 12 | Ht 70.0 in | Wt 246.0 lb

## 2022-05-01 DIAGNOSIS — K317 Polyp of stomach and duodenum: Secondary | ICD-10-CM | POA: Diagnosis not present

## 2022-05-01 DIAGNOSIS — R1319 Other dysphagia: Secondary | ICD-10-CM

## 2022-05-01 DIAGNOSIS — Z09 Encounter for follow-up examination after completed treatment for conditions other than malignant neoplasm: Secondary | ICD-10-CM

## 2022-05-01 DIAGNOSIS — D122 Benign neoplasm of ascending colon: Secondary | ICD-10-CM

## 2022-05-01 DIAGNOSIS — D120 Benign neoplasm of cecum: Secondary | ICD-10-CM

## 2022-05-01 DIAGNOSIS — D123 Benign neoplasm of transverse colon: Secondary | ICD-10-CM

## 2022-05-01 DIAGNOSIS — D124 Benign neoplasm of descending colon: Secondary | ICD-10-CM | POA: Diagnosis not present

## 2022-05-01 DIAGNOSIS — Z86010 Personal history of colonic polyps: Secondary | ICD-10-CM

## 2022-05-01 MED ORDER — SODIUM CHLORIDE 0.9 % IV SOLN: 500.0000 mL | Freq: Once | INTRAVENOUS | Status: DC

## 2022-05-01 NOTE — Patient Instructions (Addendum)
The stomach revealed multiple polyps - 3 larger ones were removed and multiple smaller ones were biopsied. They looked benign. I dilated the esophagus to see if that helps swallowing.  Colonoscopy revealed 6 polyps - all small most tiny. Removed and will be analyzed.   Also saw diverticulosis and hemorrhoids.  I will review pathology and contact you with results and plans.  Do not take aspirin x 2 weeks (no ibuprofen, naproxen, headache powders, either) - to reduce post-procedure bleeding risk.  I appreciate the opportunity to care for you. [name], MD, [facility]   Handouts on polyps, diverticulosis, hemorrhoids, and post dilation diet given to patient.  Clear liquids for 1 hour then soft diet the rest of the day, as tolerated. Normal diet tomorrow 05/02/22.  YOU HAD AN ENDOSCOPIC PROCEDURE TODAY AT [facility]:   Refer to the procedure report that was given to you for any specific questions about what was found during the examination.  If the procedure report does not answer your questions, please call your gastroenterologist to clarify.  If you requested that your care partner not be given the details of your procedure findings, then the procedure report has been included in a sealed envelope for you to review at your convenience later.  YOU SHOULD EXPECT: Some feelings of bloating in the abdomen. Passage of more gas than usual.  Walking can help get rid of the air that was put into your GI tract during the procedure and reduce the bloating. If you had a lower endoscopy (such as a colonoscopy or flexible sigmoidoscopy) you may notice spotting of blood in your stool or on the toilet paper. If you underwent a bowel prep for your procedure, you may not have a normal bowel movement for a few days.  Please Note:  You might notice some irritation and congestion in your nose or some drainage.  This is from the oxygen used during your procedure.  There is no need for concern and  it should clear up in a day or so.  SYMPTOMS TO REPORT IMMEDIATELY:  Following lower endoscopy (colonoscopy or flexible sigmoidoscopy):  Excessive amounts of blood in the stool  Significant tenderness or worsening of abdominal pains  Swelling of the abdomen that is new, acute  Fever of 100F or higher  Following upper endoscopy (EGD)  Vomiting of blood or coffee ground material  New chest pain or pain under the shoulder blades  Painful or persistently difficult swallowing  New shortness of breath  Fever of 100F or higher  Black, tarry-looking stools  For urgent or emergent issues, a gastroenterologist can be reached at any hour by calling [phone]. Do not use MyChart messaging for urgent concerns.    DIET:  We do recommend a small meal at first, but then you may proceed to your regular diet.  Drink plenty of fluids but you should avoid alcoholic beverages for 24 hours.  ACTIVITY:  You should plan to take it easy for the rest of today and you should NOT DRIVE or use heavy machinery until tomorrow (because of the sedation medicines used during the test).    FOLLOW UP: Our staff will call the number listed on your records the next business day following your procedure.  We will call around 7:15- 8:00 am to check on you and address any questions or concerns that you may have regarding the information given to you following your procedure. If we do not reach you, we will leave a  message.     If any biopsies were taken you will be contacted by phone or by letter within the next 1-3 weeks.  Please call us at [phone] if you have not heard about the biopsies in 3 weeks.    SIGNATURES/CONFIDENTIALITY: You and/or your care partner have signed paperwork which will be entered into your electronic medical record.  These signatures attest to the fact that that the information above on your After Visit Summary has been reviewed and is understood.  Full responsibility of the  confidentiality of this discharge information lies with you and/or your care-partner.

## 2022-05-01 NOTE — Op Note (Addendum)
[place] Patient Name: Erik Leon Procedure Date: 05/01/2022 7:52 AM MRN: [id number] Endoscopist: [name] , MD, [id number] Age: 71 Referring MD:  Date of Birth: 1951-03-03 Gender: Male Account #: [id number] Procedure:                Upper GI endoscopy Indications:              Dysphagia, Globus sensation Medicines:                Monitored Anesthesia Care Procedure:                Pre-Anesthesia Assessment:                           - Prior to the procedure, a History and Physical                            was performed, and patient medications and                            allergies were reviewed. The patient's tolerance of                            previous anesthesia was also reviewed. The risks                            and benefits of the procedure and the sedation                            options and risks were discussed with the patient.                            All questions were answered, and informed consent                            was obtained. Prior Anticoagulants: The patient has                            taken no anticoagulant or antiplatelet agents. ASA                            Grade Assessment: II - A patient with mild systemic                            disease. After reviewing the risks and benefits,                            the patient was deemed in satisfactory condition to                            undergo the procedure.                           After obtaining informed consent, the endoscope was  passed under direct vision. Throughout the                            procedure, the patient's blood pressure, pulse, and                            oxygen saturations were monitored continuously. The                            GIF [id number] #[id number] was introduced through the                            mouth, and advanced to the second part of duodenum.                            The upper GI endoscopy was  accomplished without                            difficulty. The patient tolerated the procedure                            well. Scope In: Scope Out: Findings:                 Esophagus was normal and dilated with 54 Fr Maloney                            dilator - reinspection - no change.                           Stomach revealed multiple - many body sessile                            polyps 2-8 mm. Three large were hot snared and                            clipped x 1 each. several smaller ones were                            biopsied.                           otherwise, stomach, duodenum and gastric                            retroflexion were normal. Biopsies were taken with                            a cold forceps for histology. Verification of                            patient identification for the specimen was done.                            Estimated blood loss was minimal. The  polyp was                            removed with a hot snare. Resection and retrieval                            were complete. Verification of patient                            identification for the specimen was done. Estimated                            blood loss was minimal.                           To close a defect after polypectomy, three                            hemostatic clips were successfully placed in the                            gastric body. Complications:            No immediate complications. Estimated Blood Loss:     Estimated blood loss was minimal. Impression:               - Three hemostatic clips were successfully placed                            in the gastric body.                           - Normal esophagus. Dilated. 77 Fr                           - Multiple gastric polyps. Three snared and                            multiple biopsied.                           - The examination was otherwise normal. Recommendation:           - Patient has a contact number  available for                            emergencies. The signs and symptoms of potential                            delayed complications were discussed with the                            patient. Return to normal activities tomorrow.                            Written discharge instructions were provided to the  patient.                           - Continue present medications.                           - No aspirin, ibuprofen, naproxen, or other                            non-steroidal anti-inflammatory drugs for 2 weeks                            after polyp removal.                           - Await pathology results.                           - See the other procedure note for documentation of                            additional recommendations.                           - Clear liquids x 1 hour then soft foods rest of                            day. Start prior diet tomorrow [Duration]. [name], MD 05/01/2022 9:19:31 AM This report has been signed electronically.

## 2022-05-01 NOTE — Progress Notes (Signed)
Pt's states no medical or surgical changes since previsit or office visit. 

## 2022-05-01 NOTE — Progress Notes (Signed)
To pacu, VSS. Report to [email or web address] 

## 2022-05-01 NOTE — Progress Notes (Signed)
[facility] History and Physical   Primary Care Physician:  Rita Ohara, MD   Reason for Procedure:     Esophageal dysphagia Yes    Gastroesophageal reflux disease, unspecified whether esophagitis present     Hoarse     Globus sensation     Hx of colonic polyps     Hemorrhoids       Plan:    Evaluate recurrent dysphagia with EGD and plan for possible esophageal dilation.   Colonoscopy with double prep for history of colon polyps.     HPI: Erik Leon is a 71 y.o. male here for evaluation of dysphagia, reflux and hx colon polyps.   Past Medical History:  Diagnosis Date   Allergy    Atypical mole 11/23/2000   slight to moderate- Right upper back (WS)   Atypical mole 11/23/2000   slight to moderate-right uppermost inner thigh (WS)   Atypical mole 10/24/2009   severe-right mid abdomen (EXC)   Atypical mole 01/06/2019   left mi back    Basal cell carcinoma 09/14/2012   superficial-left upper shoulder (txpbx)   Basal cell carcinoma 11/26/2017   sup + nod-right chest (CX35FU)   Basal cell carcinoma 01/06/2019   sup-right post shoulder (CX35FU)   BCC (basal cell carcinoma of skin) 10/24/2009   top right shoulder (CX35FU)   Calcaneal fracture 2014   right; s/p boot; reinjured in 2019   Colon polyp    COVID 08/02/2018   three time also in januray 2023   Diverticulosis 11/2007   rare L colon diverticuli   Elevated PSA 10/2012   benign prostate biopsies 11/2012   Esophageal stricture    s/p dilatation (Dr. Earlean Shawl)   GERD (gastroesophageal reflux disease)    Heart murmur    Hypertension    Hypertr obst cardiomyop    mild--Dr. Radford Pax (previously)   Internal hemorrhoid 11/2007   Migraine age 72   resolved after nasal surgery   Nodular basal cell carcinoma ([place]) 11/18/2019   [place] Nose    Past Surgical History:  Procedure Laterality Date   CHOLECYSTECTOMY  10/2001   COLONOSCOPY  11/2007, 2011, 2014   Dr. Rudi Rummage again 2019   dental  implant     ELBOW SURGERY Right 06/2013   bone spur; Dr. Percell Miller   ESOPHAGOGASTRODUODENOSCOPY  2012   with dilatation.  Dr. Earlean Shawl   KNEE ARTHROSCOPY     KNEE ARTHROSCOPY Right 08/21/2021   Dr. Ronnie Derby   LIPOMA EXCISION     Dr. Stanford Breed from L paramedian prevertebral space   NASAL POLYP SURGERY  2008   SHOULDER ARTHROSCOPY  12/03; 1/06   right; left    Prior to Admission medications   Medication Sig Start Date End Date Taking? Authorizing Provider  aspirin 81 MG tablet Take 81 mg by mouth daily.   Yes [provider]  atorvastatin (LIPITOR) 40 MG tablet TAKE 1 TABLET(40 MG) BY MOUTH DAILY AT 6 PM 04/25/22  Yes Buford Dresser, MD  cholecalciferol (VITAMIN D) 1000 units tablet Take 1,000 Units by mouth daily.   Yes [provider]  esomeprazole (NEXIUM) 40 MG capsule Take 40 mg by mouth 2 (two) times daily before a meal.   Yes [provider]  fexofenadine (ALLEGRA) 180 MG tablet Take 180 mg by mouth daily.   Yes [provider]  valsartan (DIOVAN) 80 MG tablet Take 1 tablet (80 mg total) by mouth daily. 04/28/22  Yes Buford Dresser, MD  benzonatate (TESSALON) 200 MG capsule  Take 200 mg by mouth 3 (three) times daily as needed. 04/19/22   [provider]  furosemide (LASIX) 40 MG tablet Take 1 tablet (40 mg total) by mouth daily. 07/26/21 02/06/22  Buford Dresser, MD  ibuprofen (ADVIL) 600 MG tablet Take 600 mg by mouth as needed. Patient not taking: Reported on 03/11/2022 08/21/21   [provider]    Current Outpatient Medications  Medication Sig Dispense Refill   aspirin 81 MG tablet Take 81 mg by mouth daily.     atorvastatin (LIPITOR) 40 MG tablet TAKE 1 TABLET(40 MG) BY MOUTH DAILY AT 6 PM 90 tablet 3   cholecalciferol (VITAMIN D) 1000 units tablet Take 1,000 Units by mouth daily.     esomeprazole (NEXIUM) 40 MG capsule Take 40 mg by mouth 2 (two) times daily before a meal.     fexofenadine (ALLEGRA) 180 MG  tablet Take 180 mg by mouth daily.     valsartan (DIOVAN) 80 MG tablet Take 1 tablet (80 mg total) by mouth daily. 90 tablet 3   benzonatate (TESSALON) 200 MG capsule Take 200 mg by mouth 3 (three) times daily as needed.     furosemide (LASIX) 40 MG tablet Take 1 tablet (40 mg total) by mouth daily. 90 tablet 3   ibuprofen (ADVIL) 600 MG tablet Take 600 mg by mouth as needed. (Patient not taking: Reported on 03/11/2022)     Current Facility-Administered Medications  Medication Dose Route Frequency Provider Last Rate Last Admin   0.9 %  sodium chloride infusion  500 mL Intravenous Once [name], MD        Allergies as of 05/01/2022 - Review Complete 05/01/2022  Allergen Reaction Noted   Coconut fatty acids Swelling 03/03/2018    Family History  Problem Relation Age of Onset   Hypertension Mother    Dementia Mother    Cancer Mother        bladder, metastatic   Alcohol abuse Father    Alcohol abuse Brother    Cirrhosis Brother    Liver cancer Brother    Diabetes Neg Hx    Prostate cancer Neg Hx    Colon cancer Neg Hx     Social History   Socioeconomic History   Marital status: Divorced    Spouse name: Not on file   Number of children: Not on file   Years of education: Not on file   Highest education level: Not on file  Occupational History   Occupation: [employer]: [employer]  Tobacco Use   Smoking status: Former    Types: Cigarettes    Quit date: 06/24/1983    Years since quitting: 38.8   Smokeless tobacco: Never  Vaping Use   Vaping Use: Never used  Substance and Sexual Activity   Alcohol use: Yes    Comment: 1-2 drink/month (scotch) or less, none since January   Drug use: No   Sexual activity: Yes    Partners: Female  Other Topics Concern   Not on file  Social History Narrative   Divorced. 2 biologic children, a son and a daughter.  Adopted 3 children. 1 granddaughter Tanja [place], [place], born 06/2014, equestrian)   Lives alone,  no pets.   Social Determinants of Health   Financial Resource Strain: Not on file  Food Insecurity: Not on file  Transportation Needs: Not on file  Physical Activity: Not on file  Stress: Not on file  Social Connections: Not on  file  Intimate Partner Violence: Not on file    Review of Systems:  All other review of systems negative except as mentioned in the HPI.  Physical Exam: Vital signs BP 133/81   Pulse 100   Temp (!) 97.3 F (36.3 C)   Ht '5\' 10"'$  (1.778 m)   Wt 246 lb (111.6 kg)   SpO2 95%   BMI 35.30 kg/m   General:   Alert,  Well-developed, well-nourished, pleasant and cooperative in NAD Lungs:  Clear throughout to auscultation.   Heart:  Regular rate and rhythm; no murmurs, clicks, rubs,  or gallops. Abdomen:  Soft, nontender and nondistended. Normal bowel sounds.   Neuro/Psych:  Alert and cooperative. Normal mood and affect. A and O x 3   '@Tangelia Sanson'$  Simonne Maffucci, MD, [facility] [phone] (pager) 05/01/2022 7:57 AM@

## 2022-05-01 NOTE — Op Note (Addendum)
[place] Patient Name: Erik Leon Procedure Date: 05/01/2022 7:51 AM MRN: [id number] Endoscopist: [name] , MD, [id number] Age: 71 Referring MD:  Date of Birth: 04/16/1951 Gender: Male Account #: [id number] Procedure:                Colonoscopy Indications:              Surveillance: Personal history of adenomatous                            polyps on last colonoscopy > 3 years ago, Last                            colonoscopy: 2019 Medicines:                Monitored Anesthesia Care Procedure:                Pre-Anesthesia Assessment:                           - Prior to the procedure, a History and Physical                            was performed, and patient medications and                            allergies were reviewed. The patient's tolerance of                            previous anesthesia was also reviewed. The risks                            and benefits of the procedure and the sedation                            options and risks were discussed with the patient.                            All questions were answered, and informed consent                            was obtained. Prior Anticoagulants: The patient has                            taken no anticoagulant or antiplatelet agents. ASA                            Grade Assessment: II - A patient with mild systemic                            disease. After reviewing the risks and benefits,                            the patient was deemed in satisfactory condition to  undergo the procedure.                           After obtaining informed consent, the colonoscope                            was passed under direct vision. Throughout the                            procedure, the patient's blood pressure, pulse, and                            oxygen saturations were monitored continuously. The                            Olympus Scope [id number] was introduced through the                             anus and advanced to the the cecum, identified by                            appendiceal orifice and ileocecal valve. The                            colonoscopy was somewhat difficult due to a                            redundant colon and significant looping. Successful                            completion of the procedure was aided by applying                            abdominal pressure. The patient tolerated the                            procedure well. The quality of the bowel                            preparation was excellent. The ileocecal valve,                            appendiceal orifice, and rectum were photographed.                            The bowel preparation used was Miralax and Plenvu                            via extended prep with split dose instruction. Scope In: 8:33:34 AM Scope Out: 9:03:07 AM Scope Withdrawal Time: 0 hours 20 minutes 7 seconds  Total Procedure Duration: 0 hours 29 minutes 33 seconds  Findings:                 The perianal and digital rectal examinations were  normal. Pertinent negatives include normal prostate                            (size, shape, and consistency).                           Six sessile polyps were found in the descending                            colon, transverse colon, ascending colon and cecum.                            The polyps were 2 to 8 mm in size. These polyps                            were removed with a cold snare. Resection and                            retrieval were complete. Verification of patient                            identification for the specimen was done. Estimated                            blood loss was minimal.                           Multiple diverticula were found in the sigmoid                            colon.                           External and internal hemorrhoids were found.                           The exam was  otherwise without abnormality on                            direct and retroflexion views. Complications:            No immediate complications. Estimated Blood Loss:     Estimated blood loss was minimal. Impression:               - Six 2 to 8 mm polyps in the descending colon, in                            the transverse colon, in the ascending colon and in                            the cecum, removed with a cold snare. Resected and                            retrieved.                           -  Diverticulosis in the sigmoid colon.                           - External and internal hemorrhoids.                           - The examination was otherwise normal on direct                            and retroflexion views.                           - Personal history of colonic polyps.                           2019 colonoscopy 5 polyps removed maximum 10 mm.                            For cecal and 1 right colon. Pathology was tubular                            adenoma on the right colon polyp, 2 tubular                            adenomas and 1 sessile serrated adenoma/polyp on                            the cecal polyps and then benign colonic mucosa for                            the fourth cecal polyp. Colonoscopy reports                            indicate prior colon polyps that were adenomatous                            as well though I do not have those specific records. Recommendation:           - Patient has a contact number available for                            emergencies. The signs and symptoms of potential                            delayed complications were discussed with the                            patient. Return to normal activities tomorrow.                            Written discharge instructions were provided to the                            patient.                           -  Continue present medications.                           - No aspirin, ibuprofen,  naproxen, or other                            non-steroidal anti-inflammatory drugs for 2 weeks                            after polyp removal.                           - Await pathology results.                           - Repeat colonoscopy for surveillance based on                            pathology results.                           see EGd report also [name], MD 05/01/2022 9:24:12 AM This report has been signed electronically.

## 2022-05-02 ENCOUNTER — Telehealth: Payer: Self-pay | Admitting: *Deleted

## 2022-05-02 NOTE — Telephone Encounter (Signed)
  Follow up Call-     05/01/2022    7:15 AM  Call back number  Post procedure Call Back phone  # [phone]  Permission to leave phone message Yes     Patient questions:  Do you have a fever, pain , or abdominal swelling? No. Pain Score  0 *  Have you tolerated food without any problems? Yes.    Have you been able to return to your normal activities? Yes.    Do you have any questions about your discharge instructions: Diet   No. Medications  No. Follow up visit  No.  Do you have questions or concerns about your Care? No.  Actions: * If pain score is 4 or above: No action needed, pain <4.

## 2022-05-05 DIAGNOSIS — E559 Vitamin D deficiency, unspecified: Secondary | ICD-10-CM | POA: Diagnosis not present

## 2022-05-05 DIAGNOSIS — R739 Hyperglycemia, unspecified: Secondary | ICD-10-CM | POA: Diagnosis not present

## 2022-05-05 DIAGNOSIS — N401 Enlarged prostate with lower urinary tract symptoms: Secondary | ICD-10-CM | POA: Diagnosis not present

## 2022-05-05 DIAGNOSIS — R635 Abnormal weight gain: Secondary | ICD-10-CM | POA: Diagnosis not present

## 2022-05-05 DIAGNOSIS — R5383 Other fatigue: Secondary | ICD-10-CM | POA: Diagnosis not present

## 2022-05-05 DIAGNOSIS — F5109 Other insomnia not due to a substance or known physiological condition: Secondary | ICD-10-CM | POA: Diagnosis not present

## 2022-05-05 DIAGNOSIS — E291 Testicular hypofunction: Secondary | ICD-10-CM | POA: Diagnosis not present

## 2022-05-05 DIAGNOSIS — E7800 Pure hypercholesterolemia, unspecified: Secondary | ICD-10-CM | POA: Diagnosis not present

## 2022-05-05 DIAGNOSIS — D519 Vitamin B12 deficiency anemia, unspecified: Secondary | ICD-10-CM | POA: Diagnosis not present

## 2022-05-05 DIAGNOSIS — Z136 Encounter for screening for cardiovascular disorders: Secondary | ICD-10-CM | POA: Diagnosis not present

## 2022-05-07 NOTE — Patient Instructions (Signed)
RSV vaccine is recommended. You should get this from the pharmacy. You are past due for a tetanus booster (TdaP).  You need to get this from the pharmacy.  I recommend getting the new shingles vaccine (Shingrix). Since you have Medicare, you will need to get this from the pharmacy, as it is covered by Part D. This is a series of 2 injections, spaced 2 months apart.   This should be separated from other vaccines by at least 2 weeks.

## 2022-05-07 NOTE — Progress Notes (Signed)
Chief Complaint  Patient presents with   Impaired Fasting Glucose    Fasting med check. Declined flu and covid. No new concerns.    Patient presents for 6 month follow-up on chronic issues.  Impaired fasting glucose:  Last A1c was 6.2% in 09/2021 (was 6.0% in 04/2021, 6.2% in 12/2020, 5.8% in 07/2020). He hadn't been exercising (had knee surgery 3/203). He reported not eating a lot of food, eating very healthy, but admitted his downfall was sweets. He was baking a lot with his granddaughter. Today he reports that he hasn't been eating much sweets (not with her as often), and when together they meal-prepped/cooked rather than baked. He is now walking more than he had been (for exercise and work). Energy is doubled what it was 6 months ago, since cutting back on sugar (no other changes). Has fresh orange juice every few days (made with 6 oranges); eats mangos (2 at a time).  Obesity--diet as above. Reports he has been losing weight (was up to 252# in October).  He reports getting calls from [facility], but not at opportune times, hasn't scheduled appt.  Continues to have some R knee pain, switched to seeing Dr. Fredonia Highland.  Doing a little better now (had injections), less swollen.  He had a sinus infection while in [place] for a month. Didn't swim, but exercised his legs ,which helped his knee a lot. Patient is under the care of cardiologist, who treats his HTN, lipids. Has h/o mild hypertrophic obstructive cardiomyopathy, nonobstructive CAD.  Elevated CA score 02/2018. Last echo was 12/2020.   HTN--reports compliance with valsartan (changed from lisinopril in 07/2020 by cardiologist due to cough, which resolved with change).   He denies headaches. BP's at home are normal.100-115/60-75. Compliant with lasix daily.  No further swelling in the feet. Denies any DOE, no chest pain, dizziness BP Readings from Last 3 Encounters:  05/08/22 120/70  05/01/22 (!) 110/44  03/11/22 118/78   Creatinine bumped up to  1.42 on last check. He was advised to avoid NSAIDs, stay hydrated.  He has been taking some pedialyte since his recent colonoscopy, felt dehydrated. Sometimes gets leg and foot cramps.  Due for recheck.   Coronary and aortic atherosclerosis were noted on CT angio done in ER 01/2021. He is compliant with statin.   Hyperlipidemia:  He is compliant with atorvastatin '40mg'$  daily. Denies side effects. This is prescribed by cardiologist, monitored/labs by PCP.  He tries to follow a low cholesterol diet. Lab Results  Component Value Date   CHOL 116 10/15/2021   HDL 42 10/15/2021   LDLCALC 57 10/15/2021   TRIG 87 10/15/2021   CHOLHDL 2.8 10/15/2021    Vitamin D deficiency--Level was low at 16.8 in 06/2017. Last level was normal at 47.5 in 09/2021.  He is currently taking 2000 IU daily.   GERD:  He is taking 2 of the OTC Nexium in the morning only (previously took it BID, cut out the evening dose on 10/10).  He also cut back on eating late, now eats before 5-6pm. He had EGD earlier this month with Dr. Romilda Joy gatric polyps, otherwise normal. Esophagus was dilated. He also had colonoscopy, with tubular adenomas. He recommended 3 year f/u. He denies dysphagia, heartburn, chest pain.    PMH, PSH, SH reviewed  Outpatient Encounter Medications as of 05/08/2022  Medication Sig Note   atorvastatin (LIPITOR) 40 MG tablet TAKE 1 TABLET(40 MG) BY MOUTH DAILY AT 6 PM    cholecalciferol (VITAMIN D) 1000 units  tablet Take 2,000 Units by mouth daily.    esomeprazole (NEXIUM) 40 MG capsule Take 40 mg by mouth 2 (two) times daily before a meal. 05/08/2022: Taking once daily since 04/01/22   fexofenadine (ALLEGRA) 180 MG tablet Take 180 mg by mouth daily.    furosemide (LASIX) 40 MG tablet Take 1 tablet (40 mg total) by mouth daily.    valsartan (DIOVAN) 80 MG tablet Take 1 tablet (80 mg total) by mouth daily.    aspirin 81 MG tablet Take 81 mg by mouth daily. (Patient not taking: Reported on 05/08/2022)  05/08/2022: Stopped for colonoscopy-will resume 05/12/22   ibuprofen (ADVIL) 600 MG tablet Take 600 mg by mouth as needed. (Patient not taking: Reported on 03/11/2022)    [DISCONTINUED] benzonatate (TESSALON) 200 MG capsule Take 200 mg by mouth 3 (three) times daily as needed.    No facility-administered encounter medications on file as of 05/08/2022.   Allergies  Allergen Reactions   Coconut Fatty Acids Swelling   ROS: no f/c, URI symptoms, cough, shortness of breath, chest pain, headaches, dizziness.  Energy has improved. No n/v/d, no heartburn, dysphagia. Muscle cramps in feet and legs per HPI. No skin concerns, moods are good. No other complaints. Reports some intentional weight loss (stated he was up to 252# in October).   PHYSICAL EXAM:  BP 120/70   Pulse 60   Ht '5\' 10"'$  (1.778 m)   Wt 240 lb 9.6 oz (109.1 kg)   BMI 34.52 kg/m   Wt Readings from Last 3 Encounters:  05/08/22 240 lb 9.6 oz (109.1 kg)  05/01/22 246 lb (111.6 kg)  03/11/22 246 lb 9.6 oz (111.9 kg)   Well-appearing, obese male, in good spirits, in no distress HEENT: conjunctiva and sclera are clear, EOMI, OP clear, sinuses nontender. Neck: No lymphadenopathy, thyromegaly or carotid bruit Heart: regular rate and rhythm. Murmur noted at L>R USB, unchanged. Chest: nontender Lungs: clear bilaterally Back: no spinal or CVA tenderness Abdomen: obese, soft, nontender, no organomegaly or mass Extremities: no edema. Psych: normal mood, affect, hygiene and grooming Neuro: alert and oriented, cranial nerves grossly intact, normal gait  Lab Results  Component Value Date   HGBA1C 5.9 (A) 05/08/2022     ASSESSMENT/PLAN:  Impaired fasting glucose - Encouraged to cut back on juice/fruits, and continue to work on diet, exercise, weight loss - Plan: HgB A1c  Essential hypertension - controlled with valsartan - Plan: Comprehensive metabolic panel  Pure hypercholesterolemia - at goal, cont statin, lowfat, low  cholesterol diet  Aortic atherosclerosis (HCC) - cont statin  Stage 3a chronic kidney disease ([place]) - Due for recheck.  Medication monitoring encounter - Plan: Comprehensive metabolic panel  Vaccine counseling - he declined flu/COVID vaccines today.  Discussed/recommended RSV vaccine, TdaP and Shingrix, to get from pharmacy    F/u 6 mos for AWV/med check

## 2022-05-08 ENCOUNTER — Ambulatory Visit (INDEPENDENT_AMBULATORY_CARE_PROVIDER_SITE_OTHER): Payer: Medicare Other | Admitting: Family Medicine

## 2022-05-08 ENCOUNTER — Encounter: Payer: Self-pay | Admitting: Family Medicine

## 2022-05-08 VITALS — BP 120/70 | HR 60 | Ht 70.0 in | Wt 240.6 lb

## 2022-05-08 DIAGNOSIS — I10 Essential (primary) hypertension: Secondary | ICD-10-CM | POA: Diagnosis not present

## 2022-05-08 DIAGNOSIS — R7301 Impaired fasting glucose: Secondary | ICD-10-CM

## 2022-05-08 DIAGNOSIS — Z7185 Encounter for immunization safety counseling: Secondary | ICD-10-CM

## 2022-05-08 DIAGNOSIS — N1831 Chronic kidney disease, stage 3a: Secondary | ICD-10-CM | POA: Diagnosis not present

## 2022-05-08 DIAGNOSIS — E7800 Pure hypercholesterolemia, unspecified: Secondary | ICD-10-CM

## 2022-05-08 DIAGNOSIS — I700 Atherosclerosis of aorta: Secondary | ICD-10-CM | POA: Diagnosis not present

## 2022-05-08 DIAGNOSIS — Z5181 Encounter for therapeutic drug level monitoring: Secondary | ICD-10-CM | POA: Diagnosis not present

## 2022-05-08 LAB — POCT GLYCOSYLATED HEMOGLOBIN (HGB A1C): Hemoglobin A1C: 5.9 % — AB (ref 4.0–5.6)

## 2022-05-09 LAB — COMPREHENSIVE METABOLIC PANEL
ALT: 21 IU/L (ref 0–44)
AST: 16 IU/L (ref 0–40)
Albumin/Globulin Ratio: 1.8 (ref 1.2–2.2)
Albumin: 4.2 g/dL (ref 3.8–4.8)
Alkaline Phosphatase: 96 IU/L (ref 44–121)
BUN/Creatinine Ratio: 14 (ref 10–24)
BUN: 16 mg/dL (ref 8–27)
Bilirubin Total: 0.8 mg/dL (ref 0.0–1.2)
CO2: 28 mmol/L (ref 20–29)
Calcium: 9.2 mg/dL (ref 8.6–10.2)
Chloride: 99 mmol/L (ref 96–106)
Creatinine, Ser: 1.14 mg/dL (ref 0.76–1.27)
Globulin, Total: 2.4 g/dL (ref 1.5–4.5)
Glucose: 98 mg/dL (ref 70–99)
Potassium: 4.5 mmol/L (ref 3.5–5.2)
Sodium: 141 mmol/L (ref 134–144)
Total Protein: 6.6 g/dL (ref 6.0–8.5)
eGFR: 68 mL/min/{1.73_m2} (ref >59)

## 2022-05-12 ENCOUNTER — Encounter: Payer: Self-pay | Admitting: Internal Medicine

## 2022-05-12 DIAGNOSIS — K317 Polyp of stomach and duodenum: Secondary | ICD-10-CM

## 2022-05-12 DIAGNOSIS — Z860101 Personal history of adenomatous and serrated colon polyps: Secondary | ICD-10-CM | POA: Insufficient documentation

## 2022-05-12 DIAGNOSIS — Z86010 Personal history of colonic polyps: Secondary | ICD-10-CM

## 2022-05-12 HISTORY — DX: Personal history of adenomatous and serrated colon polyps: Z86.0101

## 2022-05-12 HISTORY — DX: Polyp of stomach and duodenum: K31.7

## 2022-06-06 ENCOUNTER — Encounter: Payer: Self-pay | Admitting: Medical

## 2022-06-06 ENCOUNTER — Telehealth (INDEPENDENT_AMBULATORY_CARE_PROVIDER_SITE_OTHER): Payer: Medicare Other | Admitting: Medical

## 2022-06-06 ENCOUNTER — Encounter: Payer: Self-pay | Admitting: Family Medicine

## 2022-06-06 VITALS — BP 118/56 | HR 77 | Temp 101.2°F | Ht 70.0 in | Wt 240.0 lb

## 2022-06-06 DIAGNOSIS — I2510 Atherosclerotic heart disease of native coronary artery without angina pectoris: Secondary | ICD-10-CM | POA: Diagnosis not present

## 2022-06-06 DIAGNOSIS — U071 COVID-19: Secondary | ICD-10-CM | POA: Diagnosis not present

## 2022-06-06 MED ORDER — HYDROCODONE BIT-HOMATROP MBR 5-1.5 MG/5ML PO SOLN: 5.0000 mL | Freq: Three times a day (TID) | ORAL | 0 refills | Status: AC | PRN

## 2022-06-06 MED ORDER — EMERGEN-C IMMUNE PLUS PO PACK: 1.0000 | PACK | Freq: Two times a day (BID) | ORAL | 0 refills | Status: AC

## 2022-06-06 MED ORDER — NIRMATRELVIR/RITONAVIR (PAXLOVID)TABLET: 3.0000 | ORAL_TABLET | Freq: Two times a day (BID) | ORAL | 0 refills | Status: AC

## 2022-06-06 NOTE — Progress Notes (Signed)
Subjective:     Patient ID: Erik Leon, male   DOB: 1951-03-04, 71 y.o.   MRN: [id number]  This visit type was conducted due to national recommendations for restrictions regarding the COVID-19 Pandemic (e.g. social distancing) in an effort to limit this patient's exposure and mitigate transmission in our community.  Due to their co-morbid illnesses, this patient is at least at moderate risk for complications without adequate follow up.  This format is felt to be most appropriate for this patient at this time.    Documentation for virtual audio and video telecommunications through [facility] encounter:  The patient was located at home. The provider was located in the office. The patient did consent to this visit and is aware of possible charges through their insurance for this visit.  The other persons participating in this telemedicine service were none. Time spent on call was 20 minutes and in review of previous records 20 minutes total.  This virtual service is not related to other E/M service within previous 7 days.   HPI Chief Complaint  Patient presents with   other    Tested positive yesterday, started feeling bad Wednesday, cough, fever, headache, no ST, body aches,    Virtual consult for covid.  Tested positive yesterday.  This is the 3rd time he has had covid.   Symptoms began 2 days ago.  He notes cough, headaches, fever up to 101 currently, body aches, chills.  Coughing moderately, some phlegm.   No sore throat.  Doesn't feel particularly congested.   No NVD.   No SOB or wheezing.   No other symptoms.  No recent sick contacts.  Was out of town in [place] last week.  Using nothing for symptoms.   He did take a prescription medication last covid illness.   Was hospitalized with prior covid given severity of cough and breathing issues.  Nonsmoker, no hx/o lung disease.  Last covid illness, about a year ago in february 2023.  No other aggravating or relieving factors. No other  complaint.  Past Medical History:  Diagnosis Date   Allergy    Atypical mole 11/23/2000   slight to moderate- Right upper back (WS)   Atypical mole 11/23/2000   slight to moderate-right uppermost inner thigh (WS)   Atypical mole 10/24/2009   severe-right mid abdomen (EXC)   Atypical mole 01/06/2019   left mi back    Basal cell carcinoma 09/14/2012   superficial-left upper shoulder (txpbx)   Basal cell carcinoma 11/26/2017   sup + nod-right chest (CX35FU)   Basal cell carcinoma 01/06/2019   sup-right post shoulder (CX35FU)   BCC (basal cell carcinoma of skin) 10/24/2009   top right shoulder (CX35FU)   Calcaneal fracture 2014   right; s/p boot; reinjured in 2019   Colon polyp    COVID 08/02/2018   three time also in januray 2023   Diverticulosis 11/2007   rare L colon diverticuli   Elevated PSA 10/2012   benign prostate biopsies 11/2012   Esophageal stricture    s/p dilatation (Dr. Earlean Shawl)   GERD (gastroesophageal reflux disease)    Heart murmur    Hx of adenomatous colonic polyps 05/12/2022   Hypertension    Hypertr obst cardiomyop    mild--Dr. Radford Pax (previously)   Internal hemorrhoid 11/2007   Migraine age 32   resolved after nasal surgery   Multiple gastric polyps 05/12/2022   Hyperplastic and fundic gland 111/23   Nodular basal cell carcinoma ([place]) 11/18/2019   [place]  Nose   Current Outpatient Medications on File Prior to Visit  Medication Sig Dispense Refill   aspirin 81 MG tablet Take 81 mg by mouth daily.     atorvastatin (LIPITOR) 40 MG tablet TAKE 1 TABLET(40 MG) BY MOUTH DAILY AT 6 PM 90 tablet 3   cholecalciferol (VITAMIN D) 1000 units tablet Take 2,000 Units by mouth daily.     esomeprazole (NEXIUM) 40 MG capsule Take 40 mg by mouth 2 (two) times daily before a meal.     fexofenadine (ALLEGRA) 180 MG tablet Take 180 mg by mouth daily.     ibuprofen (ADVIL) 600 MG tablet Take 600 mg by mouth as needed.     valsartan (DIOVAN) 80 MG tablet Take 1  tablet (80 mg total) by mouth daily. 90 tablet 3   furosemide (LASIX) 40 MG tablet Take 1 tablet (40 mg total) by mouth daily. 90 tablet 3   No current facility-administered medications on file prior to visit.    Review of Systems As in subjective    Objective:   Physical Exam Due to coronavirus pandemic stay at home measures, patient visit was virtual and they were not examined in person.   BP (!) 118/56   Pulse 77   Temp (!) 101.2 F (38.4 C)   Ht '5\' 10"'$  (1.778 m)   Wt 240 lb (108.9 kg)   BMI 34.44 kg/m   Gen: wd, wn, nad No labored breathing or wheezing     Assessment:     Encounter Diagnosis  Name Primary?   COVID-19 virus infection Yes       Plan:       General recommendations: I recommend you rest, hydrate well with water and clear fluids throughout the day.   You can use Tylenol for pain or fever You can use over the counter Robitussin DM for cough, or stronger cough medicaiton prescribed today for worse cough. You can use over the counter Emetrol for nausea.    Begin Paxlovid  Begin vitamin pack as below  If you are having trouble breathing, if you are very weak, have high fever 103 or higher consistently despite Tylenol, or uncontrollable nausea and vomiting, then call or go to the emergency department.    If you have other questions or have other symptoms or questions you are concerned about then please make a virtual visit  Covid symptoms such as fatigue and cough can linger over 2 weeks, even after the initial fever, aches, chills, and other initial symptoms.   Self Quarantine: The CDC, Centers for Disease Control has recommended a self quarantine of 5 days from the start of your illness until you are symptom-free including at least 24 hours of no symptoms including no fever, no shortness of breath, and no body aches and chills, by day 5 before returning to work or general contact with the public.  What does self quarantine mean: avoiding contact  with people as much as possible.   Particularly in your house, isolate your self from others in a separate room, wear a mask when possible in the room, particularly if coughing a lot.   Have others bring food, water, medications, etc., to your door, but avoid direct contact with your household contacts during this time to avoid spreading the infection to them.   If you have a separate bathroom and living quarters during the next 2 weeks away from others, that would be preferable.    If you can't completely isolate, then wear a  mask, wash hands frequently with soap and water for at least 15 seconds, minimize close contact with others, and have a friend or family member check regularly from a distance to make sure you are not getting seriously worse.     You should not be going out in public, should not be going to stores, to work or other public places until all your symptoms have resolved and at least 5 days + 24 hours of no symptoms at all have transpired.   Ideally you should avoid contact with others for a full 5 days if possible.  One of the goals is to limit spread to high risk people; people that are older and elderly, people with multiple health issues like diabetes, heart disease, lung disease, and anybody that has weakened immune systems such as people with cancer or on immunosuppressive therapy.     Iaan was seen today for other.  Diagnoses and all orders for this visit:  COVID-19 virus infection  Other orders -     nirmatrelvir/ritonavir EUA (PAXLOVID) 20 x 150 MG & 10 x '100MG'$  TABS; Take 3 tablets by mouth 2 (two) times daily for 5 days. (Take nirmatrelvir 150 mg two tablets twice daily for 5 days and ritonavir 100 mg one tablet twice daily for 5 days) Patient GFR is 68 -     Multiple Vitamins-Minerals (EMERGEN-C IMMUNE PLUS) PACK; Take 1 tablet by mouth 2 (two) times daily. -     HYDROcodone bit-homatropine (HYCODAN) 5-1.5 MG/5ML syrup; Take 5 mLs by mouth every 8 (eight) hours as  needed for up to 5 days for cough.  F/u prn

## 2022-07-29 ENCOUNTER — Other Ambulatory Visit (HOSPITAL_BASED_OUTPATIENT_CLINIC_OR_DEPARTMENT_OTHER): Payer: Self-pay | Admitting: Cardiology

## 2022-07-29 DIAGNOSIS — I872 Venous insufficiency (chronic) (peripheral): Secondary | ICD-10-CM

## 2022-07-29 DIAGNOSIS — R600 Localized edema: Secondary | ICD-10-CM

## 2022-07-29 NOTE — Telephone Encounter (Signed)
Rx request sent to pharmacy.  

## 2022-08-07 ENCOUNTER — Ambulatory Visit (INDEPENDENT_AMBULATORY_CARE_PROVIDER_SITE_OTHER): Payer: Medicare Other | Admitting: Cardiology

## 2022-08-07 ENCOUNTER — Encounter (HOSPITAL_BASED_OUTPATIENT_CLINIC_OR_DEPARTMENT_OTHER): Payer: Self-pay | Admitting: Cardiology

## 2022-08-07 VITALS — BP 115/71 | HR 79 | Ht 70.0 in | Wt 234.2 lb

## 2022-08-07 DIAGNOSIS — I10 Essential (primary) hypertension: Secondary | ICD-10-CM | POA: Diagnosis not present

## 2022-08-07 DIAGNOSIS — E7800 Pure hypercholesterolemia, unspecified: Secondary | ICD-10-CM

## 2022-08-07 DIAGNOSIS — I2510 Atherosclerotic heart disease of native coronary artery without angina pectoris: Secondary | ICD-10-CM

## 2022-08-07 DIAGNOSIS — R600 Localized edema: Secondary | ICD-10-CM

## 2022-08-07 DIAGNOSIS — I872 Venous insufficiency (chronic) (peripheral): Secondary | ICD-10-CM

## 2022-08-07 DIAGNOSIS — E669 Obesity, unspecified: Secondary | ICD-10-CM | POA: Diagnosis not present

## 2022-08-07 DIAGNOSIS — I422 Other hypertrophic cardiomyopathy: Secondary | ICD-10-CM

## 2022-08-07 NOTE — Patient Instructions (Signed)
.  Medication Instructions:  Your physician recommends that you continue on your current medications as directed. Please refer to the Current Medication list given to you today.   *If you need a refill on your cardiac medications before your next appointment, please call your pharmacy*  Lab Work: NONE  Testing/Procedures: NONE   Follow-Up: At [facility], you and your health needs are our priority.  As part of our continuing mission to provide you with exceptional heart care, we have created designated Provider Care Teams.  These Care Teams include your primary Cardiologist (physician) and Advanced Practice Providers (APPs -  Physician Assistants and Nurse Practitioners) who all work together to provide you with the care you need, when you need it.  We recommend signing up for the patient portal called "MyChart".  Sign up information is provided on this After Visit Summary.  MyChart is used to connect with patients for Virtual Visits (Telemedicine).  Patients are able to view lab/test results, encounter notes, upcoming appointments, etc.  Non-urgent messages can be sent to your provider as well.   To learn more about what you can do with MyChart, go to [email or web address].    Your next appointment:   6 month(s)  The format for your next appointment:   In Person  Provider:   Bridgette Christopher, MD             

## 2022-08-07 NOTE — Progress Notes (Signed)
Cardiology Office Note:    Date:  08/07/2022   ID:  Erik Leon, DOB 05-May-1951, MRN [id number]  PCP:  Rita Ohara, MD  Cardiologist:  Buford Dresser, MD PhD  Referring MD: Rita Ohara, MD   CC: follow up  History of Present Illness:    Erik Leon is a 72 y.o. male with a hx of hypertension and mild HCM who is seen for follow up. He was initially seen as a new consult at the request of Dr. Tomi Bamberger for evaluation of chest pain 02/01/18.  Cardiac history: Chest pain 2019: intermittent. No aggravating factors. Last 10 seconds. If he exerts himself, feels that his pulse goes faster, but doesn't get discomfort. Started several years ago. Happens once every couple months. Last occurrence two weeks ago, occurred while sitting at his desk. Feels like when "you swallow something and it goes down the wrong pipe." Feels like it is under his heart. No radiation, no associated symptoms. He reports multiple prior stress tests, though I do not have the records. Noted as stress in Epic in 2013, but only normal echo results attached.  FH: no known CAD. Brother with congenital cardiac defect (below), may have passed from SCD.  HCM: echo in 2013 pdf reports SAM, though I cannot see additional details. Noted as mild LVH, I do not have measurements or gradients. Echo here in 2017 noted septal thickness as 1.5 cm and posterior wall thickness as 1.37 cm. No gradient noted.   Last visit. he had right knee surgery which worsened his RLE edema. Confirms worse edema with consuming a lot of salty foods. Occasionally, he had episodes of heart flutters and dizziness. This occurred 2-3 times a month, for about 3-4 seconds. These episodes have occurred while he is sitting or standing.    Today, the patient states that he has been doing well. He weighs himself every morning and has lost 25 pounds. He has been doing semaglutide, testosterone and Vitamin B 12 injections and has been feeling nauseous as a result.  He has lost his appetite for meat and fish and doesn't eat very much. His energy levels have improved incrementally due to his testosterone injections. His blood pressure has been well controlled recently, this morning it was 106/78, and in clinic it was 115/71. He hasn't been very active due to his knee surgery, however his RLE edema has greatly improved. He has a Cardia and checks it on a regular basis and has noted no issues.  He denies any palpitations, chest pain, shortness of breath, or peripheral edema. No lightheadedness, headaches, syncope, orthopnea, or PND.  Past Medical History:  Diagnosis Date   Allergy    Atypical mole 11/23/2000   slight to moderate- Right upper back (WS)   Atypical mole 11/23/2000   slight to moderate-right uppermost inner thigh (WS)   Atypical mole 10/24/2009   severe-right mid abdomen (EXC)   Atypical mole 01/06/2019   left mi back    Basal cell carcinoma 09/14/2012   superficial-left upper shoulder (txpbx)   Basal cell carcinoma 11/26/2017   sup + nod-right chest (CX35FU)   Basal cell carcinoma 01/06/2019   sup-right post shoulder (CX35FU)   BCC (basal cell carcinoma of skin) 10/24/2009   top right shoulder (CX35FU)   Calcaneal fracture 2014   right; s/p boot; reinjured in 2019   Colon polyp    COVID 08/02/2018   three time also in januray 2023   Diverticulosis 11/2007   rare L colon diverticuli  Elevated PSA 10/2012   benign prostate biopsies 11/2012   Esophageal stricture    s/p dilatation (Dr. Earlean Shawl)   GERD (gastroesophageal reflux disease)    Heart murmur    Hx of adenomatous colonic polyps 05/12/2022   Hypertension    Hypertr obst cardiomyop    mild--Dr. Radford Pax (previously)   Internal hemorrhoid 11/2007   Migraine age 32   resolved after nasal surgery   Multiple gastric polyps 05/12/2022   Hyperplastic and fundic gland 111/23   Nodular basal cell carcinoma (BCC) 11/18/2019   [place] Bridge Nose    Past Surgical History:   Procedure Laterality Date   CHOLECYSTECTOMY  10/2001   COLONOSCOPY  11/2007, 2011, 2014   Dr. Rudi Rummage again 2019   dental implant     ELBOW SURGERY Right 06/2013   bone spur; Dr. Percell Miller   ESOPHAGOGASTRODUODENOSCOPY  2012   with dilatation.  Dr. Earlean Shawl   KNEE ARTHROSCOPY     KNEE ARTHROSCOPY Right 08/21/2021   Dr. Ronnie Derby   LIPOMA EXCISION     Dr. Stanford Breed from L paramedian prevertebral space   NASAL POLYP SURGERY  2008   SHOULDER ARTHROSCOPY  12/03; 1/06   right; left    Current Medications: Current Outpatient Medications on File Prior to Visit  Medication Sig   aspirin 81 MG tablet Take 81 mg by mouth daily.   atorvastatin (LIPITOR) 40 MG tablet TAKE 1 TABLET(40 MG) BY MOUTH DAILY AT 6 PM   cholecalciferol (VITAMIN D) 1000 units tablet Take 2,000 Units by mouth daily.   esomeprazole (NEXIUM) 40 MG capsule Take 40 mg by mouth 2 (two) times daily before a meal.   fexofenadine (ALLEGRA) 180 MG tablet Take 180 mg by mouth daily.   furosemide (LASIX) 40 MG tablet TAKE 1 TABLET(40 MG) BY MOUTH DAILY   ibuprofen (ADVIL) 600 MG tablet Take 600 mg by mouth as needed.   Multiple Vitamins-Minerals (EMERGEN-C IMMUNE PLUS) PACK Take 1 tablet by mouth 2 (two) times daily.   valsartan (DIOVAN) 80 MG tablet Take 1 tablet (80 mg total) by mouth daily.   No current facility-administered medications on file prior to visit.     Allergies:   Coconut fatty acids   Social History   Tobacco Use   Smoking status: Former    Types: Cigarettes    Quit date: 06/24/1983    Years since quitting: 39.1   Smokeless tobacco: Never  Vaping Use   Vaping Use: Never used  Substance Use Topics   Alcohol use: Yes    Comment: 1-2 drink/month (scotch) or less, none since January   Drug use: No    Family History: The patient's family history includes Alcohol abuse in his brother and father; Cancer in his mother; Cirrhosis in his brother; Dementia in his mother; Hypertension in his mother; Liver  cancer in his brother. There is no history of Diabetes, Prostate cancer, or Colon cancer. Half-brother had a birth defect and died after playing handball at age 20. Not HCM as far as he knows. There were 11 sons, no other with heart problems. Not sure about his dad's history, he was an alcoholic. No MI or stroke.  ROS:   Please see the history of present illness.   (+) Nausea (+) Weight loss (+) Joint Pain Additional pertinent ROS otherwise unremarkable.  EKGs/Labs/Other Studies Reviewed:    The following studies were reviewed today:  CTA Chest 01/22/2021: COMPARISON:  Coronary chest CT from 03/03/2018   FINDINGS: Cardiovascular: Satisfactory opacification of the  pulmonary arteries to the segmental level. No evidence of pulmonary embolism. Normal heart size. No pericardial effusion. Coronary atherosclerotic calcifications. Mild scattered thoracic aortic atherosclerotic calcifications.   Mediastinum/Nodes: Left hilar and left AP window calcified lymph nodes. No pathologically enlarged mediastinal or hilar lymphadenopathy. No axillary or supraclavicular lymphadenopathy. The thyroid gland, trachea, and esophagus are within normal limits.   Lungs/Pleura: An azygos fissure is noted. Mild lingular and left lower lobar cicatricial atelectasis, unchanged. Minimal bibasilar subsegmental atelectasis. No focal consolidations. No suspicious pulmonary nodules. No pleural effusion or pneumothorax.   Upper Abdomen: Diffusely decreased attenuation of the hepatic parenchyma. Status post cholecystectomy. The remaining visualized upper abdomen is within normal limits.   Musculoskeletal: Mild multilevel degenerative changes of the thoracic spine. No acute osseous abnormality.   Review of the MIP images confirms the above findings.   IMPRESSION: Vascular:   1. No pulmonary embolism. 2. Coronary and aortic atherosclerosis (ICD10-I70.0).   Non-Vascular:   1.   Mild bibasilar subsegmental  atelectasis.   2. Similar appearing cicatricial atelectasis in the left lower lobe and lingula.   3.   Evidence of prior granulomatous disease.   4.   Hepatic steatosis.  Echo 01/03/2021:  1. Left ventricular ejection fraction, by estimation, is 60 to 65%. The  left ventricle has normal function. The left ventricle has no regional  wall motion abnormalities. There is moderate left ventricular hypertrophy  of the basal-septal segment. Left  ventricular diastolic parameters are consistent with Grade I diastolic  dysfunction (impaired relaxation). Elevated left ventricular end-diastolic  pressure.   2. Right ventricular systolic function is normal. The right ventricular  size is normal.   3. The mitral valve is normal in structure. Trivial mitral valve  regurgitation. No evidence of mitral stenosis.   4. The aortic valve is normal in structure. Aortic valve regurgitation is  not visualized. No aortic stenosis is present.   5. The inferior vena cava is normal in size with greater than 50%  respiratory variability, suggesting right atrial pressure of 3 mmHg.   CT cardiac 03/03/2018: FINDINGS: The pulmonary veins drained normally to the left atrium. Calcium Score: 198 Agatston units Coronary Arteries: Right dominant with no anomalies LM: Short, no plaque or stenosis. LAD system: Relatively small caliber vessel. Mixed plaque proximal and mid LAD, appears to be mild stenosis. Circumflex system: Large LCx. Mixed plaque mid vessel without significant stenosis. RCA system: Calcified plaque distal RCA/PLV with mild stenosis.   IMPRESSION: 1. Coronary artery calcium score 198 Agatston units. This places the patient in the 65th percentile for age and gender, suggesting intermediate risk for future cardiac events. 2. Extensive plaque in the proximal and mid LAD. Appears to be mild stenosis. 3.  Mild stenosis in distal RCA/PLV.  FFR without hemodynamically significant stenosis  Echo  2017:  Study Conclusions  - Left ventricle: The cavity size was normal. Wall thickness was   increased in a pattern of mild LVH. Systolic function was normal.   The estimated ejection fraction was in the range of 60% to 65%.   Wall motion was normal; there were no regional wall motion   abnormalities. Doppler parameters are consistent with abnormal   left ventricular relaxation (grade 1 diastolic dysfunction). - Aortic valve: There was no stenosis. - Mitral valve: There was trivial regurgitation. - Right ventricle: The cavity size was normal. Systolic function   was normal. - Pulmonary arteries: No complete TR doppler jet so unable to   estimate PA systolic pressure. - Inferior vena  cava: The vessel was normal in size. The   respirophasic diameter changes were in the normal range (>= 50%),   consistent with normal central venous pressure.   Impressions:   - Normal LV size with mild LV hypertrophy. EF 60-65%. Normal RV   size and systolic function. No significant valvular   abnormalities.  Echo 2013: (pdf scan)--noted as stress, but only resting echo in note EF >70%, mild cLVH. +SAM, trace MR  EKG:  ECG personally reviewed.  08/07/2022: The EKG is not ordered 02/06/2022:  NSR at 76 bpm, similar ST pattern 05/13/2021: not ordered 04/17/20: NSR with nonspecific ST changes, similar to 2017  Recent Labs: 10/15/2021: Hemoglobin 14.2; Platelets 199 05/08/2022: ALT 21; BUN 16; Creatinine, Ser 1.14; Potassium 4.5; Sodium 141   Recent Lipid Panel    Component Value Date/Time   CHOL 116 10/15/2021 1000   TRIG 87 10/15/2021 1000   HDL 42 10/15/2021 1000   CHOLHDL 2.8 10/15/2021 1000   CHOLHDL 4.0 02/21/2016 1035   VLDL 22 02/21/2016 1035   LDLCALC 57 10/15/2021 1000    Physical Exam:    VS:  BP 115/71 (BP Location: Right Arm, Patient Position: Sitting, Cuff Size: Large)   Pulse 79   Ht 5\' 10"  (1.778 m)   Wt 234 lb 3.2 oz (106.2 kg)   SpO2 94%   BMI 33.60 kg/m     Wt  Readings from Last 3 Encounters:  08/07/22 234 lb 3.2 oz (106.2 kg)  06/06/22 240 lb (108.9 kg)  05/08/22 240 lb 9.6 oz (109.1 kg)   GEN: Well nourished, well developed in no acute distress HEENT: Normal, moist mucous membranes NECK: No JVD CARDIAC: regular rhythm, normal S1 and S2, no rubs or gallops. 2/6 machinelike systolic murmur. VASCULAR: Radial and DP pulses 2+ bilaterally. No carotid bruits RESPIRATORY:  Clear to auscultation without rales, wheezing or rhonchi  ABDOMEN: Soft, non-tender, non-distended MUSCULOSKELETAL:  Ambulates independently SKIN: Warm and dry, trace LE edema NEUROLOGIC:  Alert and oriented x 3. No focal neuro deficits noted. PSYCHIATRIC:  Normal affect   ASSESSMENT:    1. Essential hypertension   2. Nonocclusive coronary atherosclerosis of native coronary artery   3. Hypertrophic cardiomyopathy ([place])   4. Lower extremity edema   5. Venous insufficiency   6. Pure hypercholesterolemia   7. Obesity (BMI 30-39.9)     PLAN:    Bilateral LE edema -improving since surgery -Avoid dehydration given HCM -exam looks like venous insufficiency. Discussed compression, elevation, salt avoidance  Nonobstructive CAD Hypercholesterolemia -coronary CT 02/2018 with Ca score 198, prox/mid LAD plaque but no significant stenosis by FFR -stress echo ordered previously for chest pain, but symptoms improved -continue atorvastatin -continue aspirin  HCM Murmur -Predominantly focal septal hypertrophy. Max LVOT gradient noted on imaging was 12 mmHg. -asymptomatic, no syncope, no SCD in family clearly related to HCM (though half brother had congenital heart condition and died age 22 while exercising, which is concerning).  Hypertension:  -goal <130/80, at goal today -continue valsartan  Obesity -peak home weight 252 lbs, weighed 228 on home scale this AM -using compounded semaglutide and testosterone, discussed  Secondary prevention -recommend heart  healthy/Mediterranean diet, with whole grains, fruits, vegetable, fish, lean meats, nuts, and olive oil. Limit salt. -recommend moderate walking, 3-5 times/week for 30-50 minutes each session. Aim for at least 150 minutes.week. Goal should be pace of 3 miles/hours, or walking 1.5 miles in 30 minutes -recommend avoidance of tobacco products. Avoid excess alcohol.  Plan for follow  up: 6 mos or sooner PRN  Medication Adjustments/Labs and Tests Ordered: Current medicines are reviewed at length with the patient today.  Concerns regarding medicines are outlined above.   No orders of the defined types were placed in this encounter.  No orders of the defined types were placed in this encounter.  Patient Instructions  .Medication Instructions:  Your physician recommends that you continue on your current medications as directed. Please refer to the Current Medication list given to you today.  *If you need a refill on your cardiac medications before your next appointment, please call your pharmacy*  Lab Work: NONE  Testing/Procedures: NONE  Follow-Up: At [facility], you and your health needs are our priority.  As part of our continuing mission to provide you with exceptional heart care, we have created designated Provider Care Teams.  These Care Teams include your primary Cardiologist (physician) and Advanced Practice Providers (APPs -  Physician Assistants and Nurse Practitioners) who all work together to provide you with the care you need, when you need it.  We recommend signing up for the patient portal called "MyChart".  Sign up information is provided on this After Visit Summary.  MyChart is used to connect with patients for Virtual Visits (Telemedicine).  Patients are able to view lab/test results, encounter notes, upcoming appointments, etc.  Non-urgent messages can be sent to your provider as well.   To learn more about what you can do with MyChart, go to [email or web address].     Your next appointment:   6 month(s)  The format for your next appointment:   In Person  Provider:   Buford Dresser, MD       I,Coren O'Brien,acting as a scribe for Buford Dresser, MD.,have documented all relevant documentation on the behalf of Buford Dresser, MD,as directed by  Buford Dresser, MD while in the presence of Buford Dresser, MD.  I, Buford Dresser, MD, have reviewed all documentation for this visit. The documentation on 09/07/22 for the exam, diagnosis, procedures, and orders are all accurate and complete.

## 2022-09-07 ENCOUNTER — Encounter (HOSPITAL_BASED_OUTPATIENT_CLINIC_OR_DEPARTMENT_OTHER): Payer: Self-pay | Admitting: Cardiology

## 2022-09-11 DIAGNOSIS — H04123 Dry eye syndrome of bilateral lacrimal glands: Secondary | ICD-10-CM | POA: Diagnosis not present

## 2022-09-11 DIAGNOSIS — H0102A Squamous blepharitis right eye, upper and lower eyelids: Secondary | ICD-10-CM | POA: Diagnosis not present

## 2022-09-11 DIAGNOSIS — H0102B Squamous blepharitis left eye, upper and lower eyelids: Secondary | ICD-10-CM | POA: Diagnosis not present

## 2022-09-11 DIAGNOSIS — H524 Presbyopia: Secondary | ICD-10-CM | POA: Diagnosis not present

## 2022-09-11 DIAGNOSIS — H5203 Hypermetropia, bilateral: Secondary | ICD-10-CM | POA: Diagnosis not present

## 2022-10-22 ENCOUNTER — Ambulatory Visit: Payer: Medicare Other | Admitting: Family Medicine

## 2022-11-14 DIAGNOSIS — R635 Abnormal weight gain: Secondary | ICD-10-CM | POA: Diagnosis not present

## 2022-11-14 DIAGNOSIS — E559 Vitamin D deficiency, unspecified: Secondary | ICD-10-CM | POA: Diagnosis not present

## 2022-11-14 DIAGNOSIS — E291 Testicular hypofunction: Secondary | ICD-10-CM | POA: Diagnosis not present

## 2022-11-14 DIAGNOSIS — R5383 Other fatigue: Secondary | ICD-10-CM | POA: Diagnosis not present

## 2022-11-14 DIAGNOSIS — D519 Vitamin B12 deficiency anemia, unspecified: Secondary | ICD-10-CM | POA: Diagnosis not present

## 2022-11-14 DIAGNOSIS — F5109 Other insomnia not due to a substance or known physiological condition: Secondary | ICD-10-CM | POA: Diagnosis not present

## 2022-11-14 DIAGNOSIS — R3589 Other polyuria: Secondary | ICD-10-CM | POA: Diagnosis not present

## 2022-11-14 DIAGNOSIS — E7800 Pure hypercholesterolemia, unspecified: Secondary | ICD-10-CM | POA: Diagnosis not present

## 2022-12-10 IMAGING — MG DIGITAL DIAGNOSTIC BILAT W/ TOMO W/ CAD
6 of 10 series · 6 of 30 positions shown · non-contrast
Comparison: None.

CLINICAL DATA: Right breast swelling.

EXAM:
DIGITAL DIAGNOSTIC BILATERAL MAMMOGRAM WITH TOMOSYNTHESIS AND CAD
TECHNIQUE: Bilateral digital diagnostic mammography and breast tomosynthesis
was performed. The images were evaluated with computer-aided
detection.

[R TAN synth-2D]
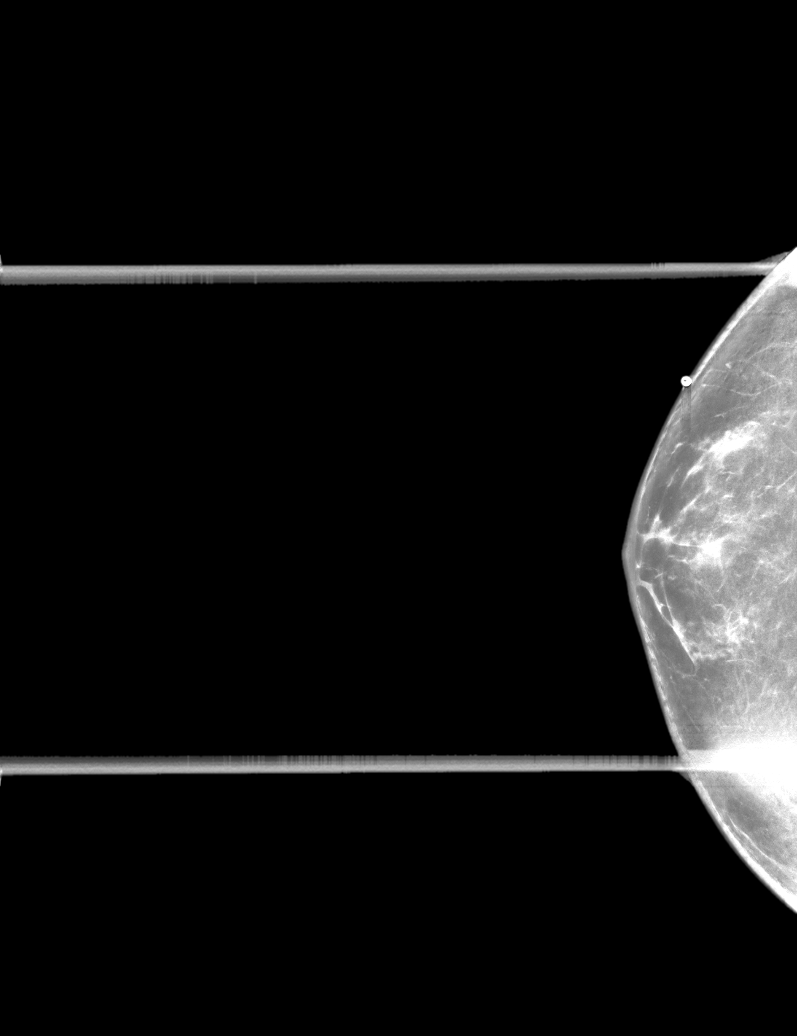

[L MLO synth-2D]
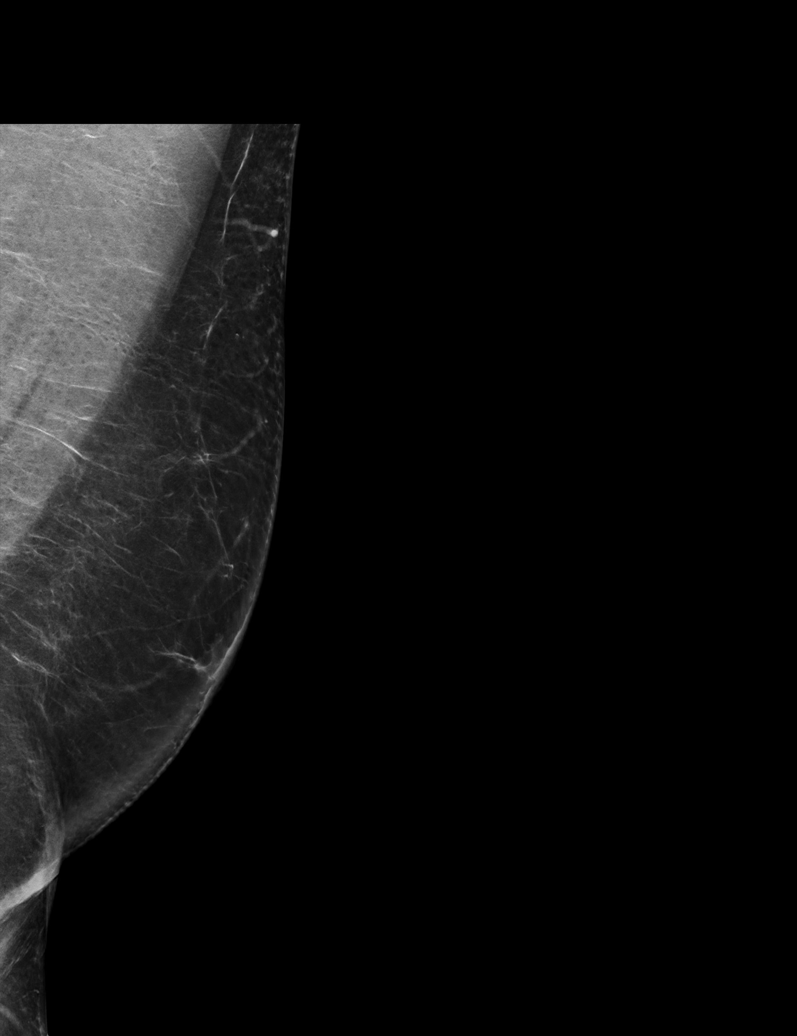

[L CC synth-2D]
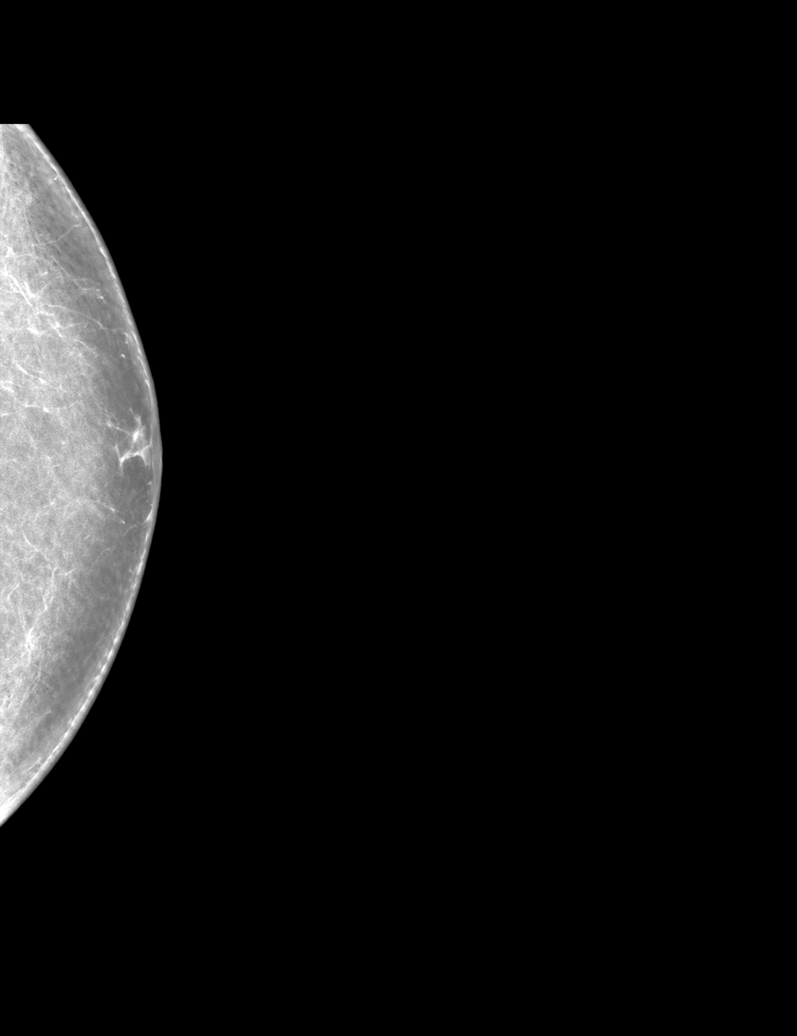

[R MLO synth-2D]
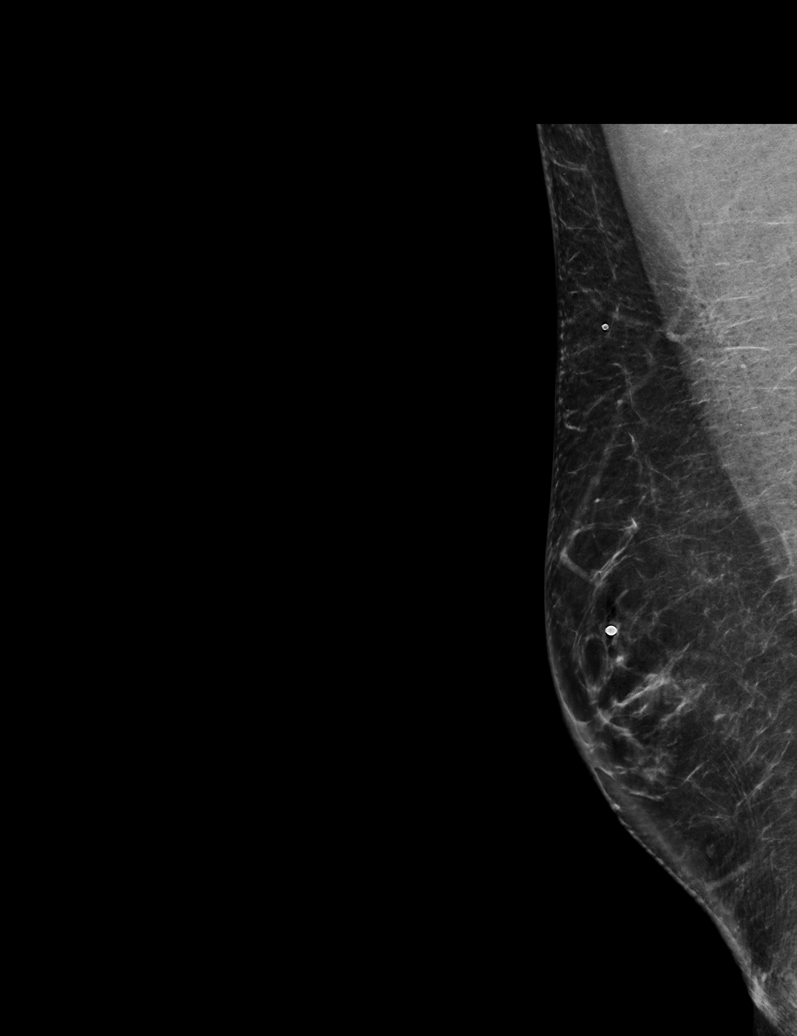

[R CC synth-2D]
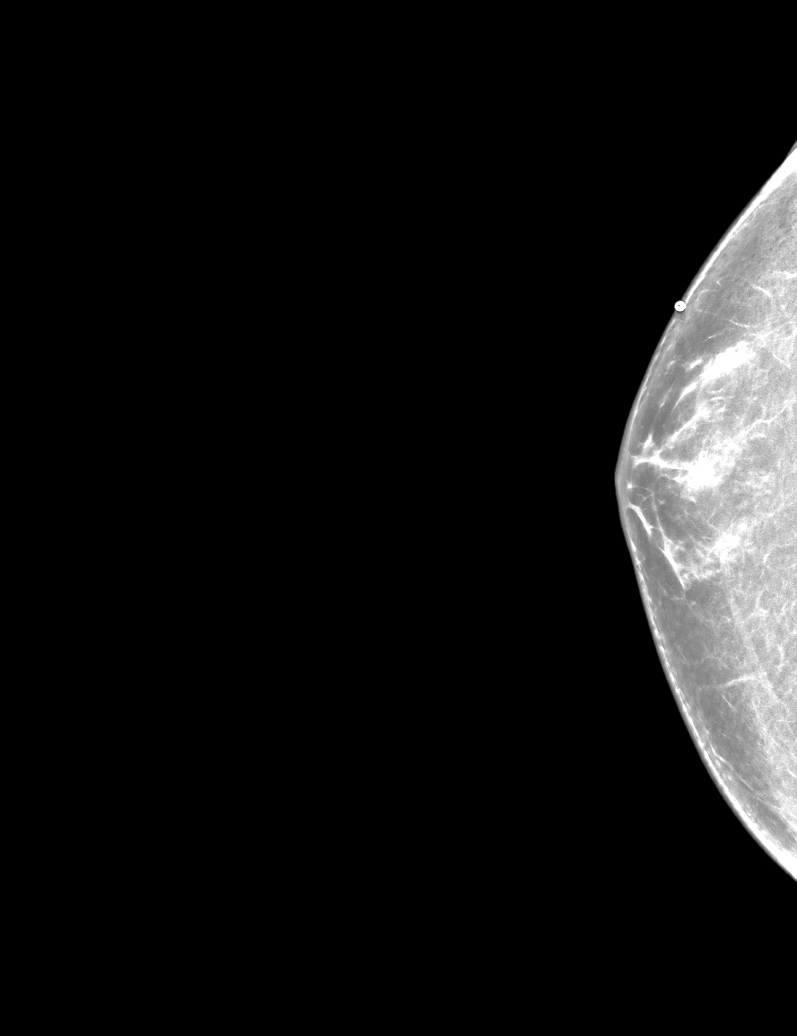

[R CC tomo · tomo slice 31/60.0]
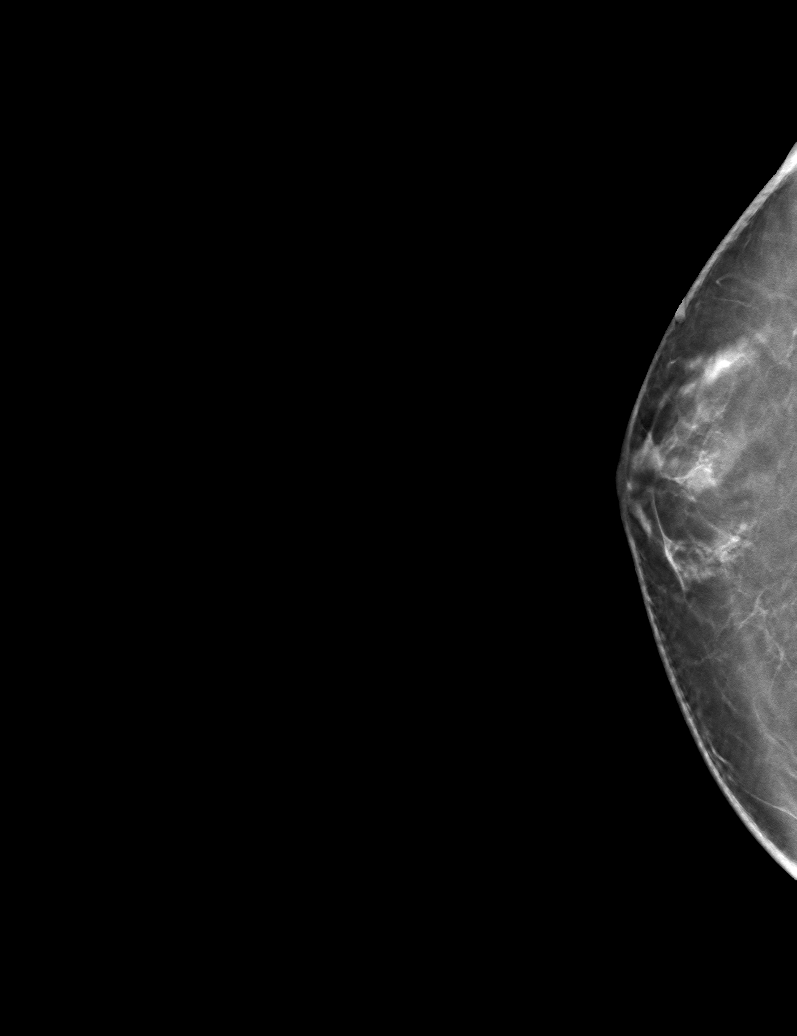

[6 of 30 positions shown; findings below may reference images not displayed]

ACR Breast Density Category b: There are scattered areas of
fibroglandular density.
FINDINGS: In the right breast, there is increased attenuation in a
characteristic pattern of gynecomastia. There are no defined masses,
no areas of architectural distortion and no suspicious
calcifications. Left breast is predominantly fatty attenuation with
no significant gynecomastia, no mass or distortion and no suspicious
calcifications.
IMPRESSION: 1. No evidence of breast malignancy.
2. Benign right breast gynecomastia.

RECOMMENDATION:
Clinical follow-up for the benign right breast gynecomastia.

I have discussed the findings and recommendations with the patient.
If applicable, a reminder letter will be sent to the patient
regarding the next appointment.

BI-RADS CATEGORY  2: Benign.

## 2023-01-17 ENCOUNTER — Other Ambulatory Visit (HOSPITAL_BASED_OUTPATIENT_CLINIC_OR_DEPARTMENT_OTHER): Payer: Self-pay | Admitting: Cardiology

## 2023-01-17 DIAGNOSIS — I2510 Atherosclerotic heart disease of native coronary artery without angina pectoris: Secondary | ICD-10-CM

## 2023-01-19 NOTE — Telephone Encounter (Signed)
Rx request sent to pharmacy.  

## 2023-02-09 ENCOUNTER — Encounter (HOSPITAL_BASED_OUTPATIENT_CLINIC_OR_DEPARTMENT_OTHER): Payer: Self-pay | Admitting: Cardiology

## 2023-02-09 ENCOUNTER — Ambulatory Visit (INDEPENDENT_AMBULATORY_CARE_PROVIDER_SITE_OTHER): Payer: Medicare Other | Admitting: Cardiology

## 2023-02-09 VITALS — BP 114/74 | HR 84 | Ht 70.0 in | Wt 196.3 lb

## 2023-02-09 DIAGNOSIS — I10 Essential (primary) hypertension: Secondary | ICD-10-CM

## 2023-02-09 DIAGNOSIS — E7800 Pure hypercholesterolemia, unspecified: Secondary | ICD-10-CM

## 2023-02-09 DIAGNOSIS — I2510 Atherosclerotic heart disease of native coronary artery without angina pectoris: Secondary | ICD-10-CM | POA: Diagnosis not present

## 2023-02-09 DIAGNOSIS — I422 Other hypertrophic cardiomyopathy: Secondary | ICD-10-CM

## 2023-02-09 DIAGNOSIS — R600 Localized edema: Secondary | ICD-10-CM

## 2023-02-09 NOTE — Progress Notes (Signed)
Cardiology Office Note:  .    Date:  02/09/2023  ID:  Erik Leon, DOB 1951/01/02, MRN [id number] PCP: Joselyn Arrow, MD  [facility] Providers Cardiologist:  Jodelle Red, MD     History of Present Illness: .    Erik Leon is a 72 y.o. male with a hx of hypertension and mild HCM who is seen for follow up. He was initially seen as a new consult at the request of Dr. Lynelle Doctor for evaluation of chest pain 02/01/18.   Cardiac history:   Chest pain 2019 - intermittent. No aggravating factors. Last 10 seconds. If he exerts himself, feels that his pulse goes faster, but doesn't get discomfort. Started several years ago. Happens once every couple months. Last occurrence two weeks ago, occurred while sitting at his desk. Feels like when "you swallow something and it goes down the wrong pipe." Feels like it is under his heart. No radiation, no associated symptoms. He reports multiple prior stress tests, though I do not have the records. Noted as stress in Epic in 2013, but only normal echo results attached.   FH: no known CAD. Brother with congenital cardiac defect (below), may have passed from SCD.   HCM: echo in 2013 pdf reports SAM, though I cannot see additional details. Noted as mild LVH, I do not have measurements or gradients. Echo here in 2017 noted septal thickness as 1.5 cm and posterior wall thickness as 1.37 cm. No gradient noted.   At his visit 07/2022, he was doing well and had lost 25 lbs per his home scale. He complained of nausea on semaglutide, testosterone, and Vitamin B12 injections. Energy levels were improving incrementally due to his testosterone injections. Blood pressures were well controlled at home and in the office. Physical exam consistent with venous insufficiency. Discussed compression, elevation, salt avoidance.  Today, he is feeling well overall. In the last 2 months, his home readings have been consistently under 110/62-74. No associated dizziness or  lightheadedness. This morning his blood pressure was 104/65, and 114/74 in the office. He notes that he recently finished lunch.    His weight was 189 lbs this morning at home, and 196 in the office. Recently was as low as 185 lbs. He notes it is likely he will be maintaining a range of 188-190 lbs. In the past his peak weight was 252 lbs. Currently on 75 units Tirzepatide for 2 more weeks, tapering down. No cravings, not eating due to boredom. He remains conscientious of his sodium and sugar intake. Reports a more recent A1c in June, 5.4%. Previously had tremors caused by one of his medications which was discontinued two weeks with subsequent improvement ([id number], Tesofensine). Atorvastatin is now on hold. This past week he has started new exercise routines. Walking has been his most strenuous activity, probably less than 2000 yards. Mostly limited by fatigue. In September he plans to focus on building muscle mass.  He reports nonexistent leg swelling. Due to re-injuring his knee recently, he restarted Lasix as preventative therapy. Causes dry mouth.   Acid reflux has improved significantly. He no longer needs to sleep in an elevated position.  He denies any palpitations, chest pain, shortness of breath, headaches, syncope, orthopnea, or PND.  ROS:  Please see the history of present illness. ROS otherwise negative except as noted.  (+) Fatigue (+) Dry mouth  Studies Reviewed: Marland Kitchen    EKG Interpretation Date/Time:  Monday February 09 2023 14:01:21 EDT Ventricular Rate:  84 PR Interval:  138 QRS Duration:  92 QT Interval:  340 QTC Calculation: 401 R Axis:   -2  Text Interpretation: Normal sinus rhythm Nonspecific T wave abnormality Confirmed by Jodelle Red [phone]) on 02/09/2023 2:17:57 PM    Physical Exam:    VS:  BP 114/74 (BP Location: Left Arm, Patient Position: Sitting, Cuff Size: Normal)   Pulse 84   Ht 5\' 10"  (1.778 m)   Wt 196 lb 4.8 oz (89 kg)   BMI 28.17 kg/m    Wt  Readings from Last 3 Encounters:  02/09/23 196 lb 4.8 oz (89 kg)  08/07/22 234 lb 3.2 oz (106.2 kg)  06/06/22 240 lb (108.9 kg)    GEN: Well nourished, well developed in no acute distress HEENT: Normal, moist mucous membranes NECK: No JVD CARDIAC: regular rhythm, normal S1 and S2, no rubs or gallops. 2/6 machinelike systolic murmur. VASCULAR: Radial and DP pulses 2+ bilaterally. No carotid bruits RESPIRATORY:  Clear to auscultation without rales, wheezing or rhonchi  ABDOMEN: Soft, non-tender, non-distended MUSCULOSKELETAL:  Ambulates independently SKIN: Warm and dry, trace ankle edema NEUROLOGIC:  Alert and oriented x 3. No focal neuro deficits noted. PSYCHIATRIC:  Normal affect   ASSESSMENT AND PLAN: .    Bilateral LE edema -improving -Avoid dehydration given HCM. Takes lasix daily and feels that this works well, but I have a low threshold to cut this back to PRN if lightheadedness occurs -Discussed compression, elevation, salt avoidance   Nonobstructive CAD Hypercholesterolemia -coronary CT 02/2018 with Ca score 198, prox/mid LAD plaque but no significant stenosis by FFR -stress echo ordered previously for chest pain, but symptoms improved -continue atorvastatin--stopped briefly due to tremors, but these were due to two other medications. He will restart atorvastatin and contact me with any issues -continue aspirin   HCM Murmur -Predominantly focal septal hypertrophy. Max LVOT gradient noted on imaging was 12 mmHg. -asymptomatic, no syncope, no SCD in family clearly related to HCM (though half brother had congenital heart condition and died age 28 while exercising, which is concerning).   Hypertension:  -goal <130/80, at goal today and has been decreasing with weight loss -continue valsartan, furosemide for now. Would cut back or stop furosemide first, then valsartan, if he has lightheadedness   Obesity -peak home weight 252 lbs, nadir 185 lbs, now stable in the 190s and  tapering off GLP1 -agree with increasing weight bearing exercise to preserve/improve muscle mass   Secondary prevention -recommend heart healthy/Mediterranean diet, with whole grains, fruits, vegetable, fish, lean meats, nuts, and olive oil. Limit salt. -recommend moderate walking, 3-5 times/week for 30-50 minutes each session. Aim for at least 150 minutes.week. Goal should be pace of 3 miles/hours, or walking 1.5 miles in 30 minutes -recommend avoidance of tobacco products. Avoid excess alcohol.  Dispo: Follow-up in 6 months, or sooner as needed.  I,Mathew Stumpf,acting as a Neurosurgeon for [employer], MD.,have documented all relevant documentation on the behalf of Jodelle Red, MD,as directed by  Jodelle Red, MD while in the presence of Jodelle Red, MD.  I, Jodelle Red, MD, have reviewed all documentation for this visit. The documentation on 02/09/23 for the exam, diagnosis, procedures, and orders are all accurate and complete.   Signed, Jodelle Red, MD

## 2023-02-09 NOTE — Patient Instructions (Signed)
Medication Instructions:  Your physician recommends that you continue on your current medications as directed. Please refer to the Current Medication list given to you today.  If you feel dizzy/lightheaded, especially with increasing activity, please let me know. I would cut back on the furosemide (lasix, fluid pill) and/or valsartan if you blood pressure is dropping.  Restart the atorvastatin, call if you have any issues with this.   *If you need a refill on your cardiac medications before your next appointment, please call your pharmacy*  Follow-Up: At [facility], you and your health needs are our priority.  As part of our continuing mission to provide you with exceptional heart care, we have created designated Provider Care Teams.  These Care Teams include your primary Cardiologist (physician) and Advanced Practice Providers (APPs -  Physician Assistants and Nurse Practitioners) who all work together to provide you with the care you need, when you need it.  We recommend signing up for the patient portal called "MyChart".  Sign up information is provided on this After Visit Summary.  MyChart is used to connect with patients for Virtual Visits (Telemedicine).  Patients are able to view lab/test results, encounter notes, upcoming appointments, etc.  Non-urgent messages can be sent to your provider as well.   To learn more about what you can do with MyChart, go to [email or web address].    Your next appointment:   6 months with Dr. Cristal Deer

## 2023-02-16 DIAGNOSIS — F5109 Other insomnia not due to a substance or known physiological condition: Secondary | ICD-10-CM | POA: Diagnosis not present

## 2023-02-16 DIAGNOSIS — R3589 Other polyuria: Secondary | ICD-10-CM | POA: Diagnosis not present

## 2023-02-16 DIAGNOSIS — D519 Vitamin B12 deficiency anemia, unspecified: Secondary | ICD-10-CM | POA: Diagnosis not present

## 2023-02-16 DIAGNOSIS — R635 Abnormal weight gain: Secondary | ICD-10-CM | POA: Diagnosis not present

## 2023-02-16 DIAGNOSIS — E291 Testicular hypofunction: Secondary | ICD-10-CM | POA: Diagnosis not present

## 2023-02-16 DIAGNOSIS — E559 Vitamin D deficiency, unspecified: Secondary | ICD-10-CM | POA: Diagnosis not present

## 2023-02-16 DIAGNOSIS — E7800 Pure hypercholesterolemia, unspecified: Secondary | ICD-10-CM | POA: Diagnosis not present

## 2023-02-16 DIAGNOSIS — R5383 Other fatigue: Secondary | ICD-10-CM | POA: Diagnosis not present

## 2023-03-09 ENCOUNTER — Other Ambulatory Visit (HOSPITAL_BASED_OUTPATIENT_CLINIC_OR_DEPARTMENT_OTHER): Payer: Self-pay | Admitting: Cardiology

## 2023-03-09 DIAGNOSIS — R600 Localized edema: Secondary | ICD-10-CM

## 2023-03-09 DIAGNOSIS — I872 Venous insufficiency (chronic) (peripheral): Secondary | ICD-10-CM

## 2023-03-12 DIAGNOSIS — L538 Other specified erythematous conditions: Secondary | ICD-10-CM | POA: Diagnosis not present

## 2023-03-12 DIAGNOSIS — L821 Other seborrheic keratosis: Secondary | ICD-10-CM | POA: Diagnosis not present

## 2023-03-12 DIAGNOSIS — L820 Inflamed seborrheic keratosis: Secondary | ICD-10-CM | POA: Diagnosis not present

## 2023-03-12 DIAGNOSIS — Z85828 Personal history of other malignant neoplasm of skin: Secondary | ICD-10-CM | POA: Diagnosis not present

## 2023-03-12 DIAGNOSIS — L298 Other pruritus: Secondary | ICD-10-CM | POA: Diagnosis not present

## 2023-03-12 DIAGNOSIS — Z789 Other specified health status: Secondary | ICD-10-CM | POA: Diagnosis not present

## 2023-03-12 DIAGNOSIS — L814 Other melanin hyperpigmentation: Secondary | ICD-10-CM | POA: Diagnosis not present

## 2023-03-12 DIAGNOSIS — D225 Melanocytic nevi of trunk: Secondary | ICD-10-CM | POA: Diagnosis not present

## 2023-03-12 DIAGNOSIS — Z08 Encounter for follow-up examination after completed treatment for malignant neoplasm: Secondary | ICD-10-CM | POA: Diagnosis not present

## 2023-03-12 DIAGNOSIS — L7211 Pilar cyst: Secondary | ICD-10-CM | POA: Diagnosis not present

## 2023-04-23 ENCOUNTER — Encounter: Payer: Self-pay | Admitting: *Deleted

## 2023-04-27 ENCOUNTER — Ambulatory Visit: Payer: Medicare Other | Admitting: Family Medicine

## 2023-04-27 DIAGNOSIS — I700 Atherosclerosis of aorta: Secondary | ICD-10-CM

## 2023-04-27 DIAGNOSIS — Z5181 Encounter for therapeutic drug level monitoring: Secondary | ICD-10-CM

## 2023-04-27 DIAGNOSIS — Z0000 Encounter for general adult medical examination without abnormal findings: Secondary | ICD-10-CM

## 2023-04-27 DIAGNOSIS — R7301 Impaired fasting glucose: Secondary | ICD-10-CM

## 2023-04-27 DIAGNOSIS — N1831 Chronic kidney disease, stage 3a: Secondary | ICD-10-CM

## 2023-04-27 DIAGNOSIS — I10 Essential (primary) hypertension: Secondary | ICD-10-CM

## 2023-04-27 DIAGNOSIS — E7800 Pure hypercholesterolemia, unspecified: Secondary | ICD-10-CM

## 2023-05-01 DIAGNOSIS — F5109 Other insomnia not due to a substance or known physiological condition: Secondary | ICD-10-CM | POA: Diagnosis not present

## 2023-05-01 DIAGNOSIS — E7800 Pure hypercholesterolemia, unspecified: Secondary | ICD-10-CM | POA: Diagnosis not present

## 2023-05-01 DIAGNOSIS — R3589 Other polyuria: Secondary | ICD-10-CM | POA: Diagnosis not present

## 2023-05-01 DIAGNOSIS — E559 Vitamin D deficiency, unspecified: Secondary | ICD-10-CM | POA: Diagnosis not present

## 2023-05-01 DIAGNOSIS — E291 Testicular hypofunction: Secondary | ICD-10-CM | POA: Diagnosis not present

## 2023-05-01 DIAGNOSIS — R5383 Other fatigue: Secondary | ICD-10-CM | POA: Diagnosis not present

## 2023-05-05 ENCOUNTER — Encounter: Payer: Self-pay | Admitting: Internal Medicine

## 2023-05-11 ENCOUNTER — Other Ambulatory Visit (HOSPITAL_BASED_OUTPATIENT_CLINIC_OR_DEPARTMENT_OTHER): Payer: Self-pay | Admitting: Cardiology

## 2023-05-11 DIAGNOSIS — I2510 Atherosclerotic heart disease of native coronary artery without angina pectoris: Secondary | ICD-10-CM

## 2023-05-19 ENCOUNTER — Other Ambulatory Visit (HOSPITAL_BASED_OUTPATIENT_CLINIC_OR_DEPARTMENT_OTHER): Payer: Self-pay | Admitting: Cardiology

## 2023-05-19 DIAGNOSIS — I10 Essential (primary) hypertension: Secondary | ICD-10-CM

## 2023-08-14 ENCOUNTER — Other Ambulatory Visit (HOSPITAL_BASED_OUTPATIENT_CLINIC_OR_DEPARTMENT_OTHER): Payer: Self-pay | Admitting: Cardiology

## 2023-08-14 DIAGNOSIS — I10 Essential (primary) hypertension: Secondary | ICD-10-CM

## 2023-09-03 ENCOUNTER — Ambulatory Visit: Payer: Medicare Other | Admitting: Family Medicine

## 2023-11-09 ENCOUNTER — Other Ambulatory Visit (HOSPITAL_BASED_OUTPATIENT_CLINIC_OR_DEPARTMENT_OTHER): Payer: Self-pay | Admitting: Cardiology

## 2023-11-09 DIAGNOSIS — I872 Venous insufficiency (chronic) (peripheral): Secondary | ICD-10-CM

## 2023-11-09 DIAGNOSIS — R600 Localized edema: Secondary | ICD-10-CM

## 2023-11-11 DIAGNOSIS — F5109 Other insomnia not due to a substance or known physiological condition: Secondary | ICD-10-CM | POA: Diagnosis not present

## 2023-11-11 DIAGNOSIS — E7800 Pure hypercholesterolemia, unspecified: Secondary | ICD-10-CM | POA: Diagnosis not present

## 2023-11-11 DIAGNOSIS — R635 Abnormal weight gain: Secondary | ICD-10-CM | POA: Diagnosis not present

## 2023-11-11 DIAGNOSIS — D519 Vitamin B12 deficiency anemia, unspecified: Secondary | ICD-10-CM | POA: Diagnosis not present

## 2023-11-11 DIAGNOSIS — R5383 Other fatigue: Secondary | ICD-10-CM | POA: Diagnosis not present

## 2023-11-11 DIAGNOSIS — E559 Vitamin D deficiency, unspecified: Secondary | ICD-10-CM | POA: Diagnosis not present

## 2023-11-11 DIAGNOSIS — E291 Testicular hypofunction: Secondary | ICD-10-CM | POA: Diagnosis not present

## 2023-11-11 DIAGNOSIS — R3589 Other polyuria: Secondary | ICD-10-CM | POA: Diagnosis not present

## 2024-02-11 ENCOUNTER — Other Ambulatory Visit (HOSPITAL_BASED_OUTPATIENT_CLINIC_OR_DEPARTMENT_OTHER): Payer: Self-pay | Admitting: Cardiology

## 2024-02-11 DIAGNOSIS — I872 Venous insufficiency (chronic) (peripheral): Secondary | ICD-10-CM

## 2024-02-11 DIAGNOSIS — R600 Localized edema: Secondary | ICD-10-CM

## 2024-03-07 ENCOUNTER — Other Ambulatory Visit (HOSPITAL_BASED_OUTPATIENT_CLINIC_OR_DEPARTMENT_OTHER): Payer: Self-pay | Admitting: Cardiology

## 2024-03-07 DIAGNOSIS — I2510 Atherosclerotic heart disease of native coronary artery without angina pectoris: Secondary | ICD-10-CM

## 2024-03-08 ENCOUNTER — Other Ambulatory Visit (HOSPITAL_BASED_OUTPATIENT_CLINIC_OR_DEPARTMENT_OTHER): Payer: Self-pay | Admitting: Cardiology

## 2024-03-08 DIAGNOSIS — I10 Essential (primary) hypertension: Secondary | ICD-10-CM

## 2024-03-15 DIAGNOSIS — L814 Other melanin hyperpigmentation: Secondary | ICD-10-CM | POA: Diagnosis not present

## 2024-03-15 DIAGNOSIS — L821 Other seborrheic keratosis: Secondary | ICD-10-CM | POA: Diagnosis not present

## 2024-04-14 DIAGNOSIS — D519 Vitamin B12 deficiency anemia, unspecified: Secondary | ICD-10-CM | POA: Diagnosis not present

## 2024-04-14 DIAGNOSIS — E7800 Pure hypercholesterolemia, unspecified: Secondary | ICD-10-CM | POA: Diagnosis not present

## 2024-04-14 DIAGNOSIS — E559 Vitamin D deficiency, unspecified: Secondary | ICD-10-CM | POA: Diagnosis not present

## 2024-04-14 DIAGNOSIS — R5383 Other fatigue: Secondary | ICD-10-CM | POA: Diagnosis not present

## 2024-04-14 DIAGNOSIS — E291 Testicular hypofunction: Secondary | ICD-10-CM | POA: Diagnosis not present

## 2024-04-14 DIAGNOSIS — R635 Abnormal weight gain: Secondary | ICD-10-CM | POA: Diagnosis not present

## 2024-04-14 DIAGNOSIS — R3589 Other polyuria: Secondary | ICD-10-CM | POA: Diagnosis not present

## 2024-04-14 DIAGNOSIS — F5109 Other insomnia not due to a substance or known physiological condition: Secondary | ICD-10-CM | POA: Diagnosis not present

## 2024-04-26 ENCOUNTER — Other Ambulatory Visit (HOSPITAL_BASED_OUTPATIENT_CLINIC_OR_DEPARTMENT_OTHER): Payer: Self-pay | Admitting: Cardiology

## 2024-04-26 DIAGNOSIS — I872 Venous insufficiency (chronic) (peripheral): Secondary | ICD-10-CM

## 2024-04-26 DIAGNOSIS — R600 Localized edema: Secondary | ICD-10-CM

## 2024-05-25 ENCOUNTER — Other Ambulatory Visit (HOSPITAL_BASED_OUTPATIENT_CLINIC_OR_DEPARTMENT_OTHER): Payer: Self-pay | Admitting: Cardiology

## 2024-05-25 DIAGNOSIS — I872 Venous insufficiency (chronic) (peripheral): Secondary | ICD-10-CM

## 2024-05-25 DIAGNOSIS — R600 Localized edema: Secondary | ICD-10-CM

## 2024-05-31 DIAGNOSIS — R972 Elevated prostate specific antigen [PSA]: Secondary | ICD-10-CM | POA: Diagnosis not present

## 2024-06-01 ENCOUNTER — Other Ambulatory Visit: Payer: Self-pay | Admitting: Cardiology

## 2024-06-01 ENCOUNTER — Other Ambulatory Visit (HOSPITAL_BASED_OUTPATIENT_CLINIC_OR_DEPARTMENT_OTHER): Payer: Self-pay | Admitting: Cardiology

## 2024-06-01 DIAGNOSIS — R600 Localized edema: Secondary | ICD-10-CM

## 2024-06-01 DIAGNOSIS — I10 Essential (primary) hypertension: Secondary | ICD-10-CM

## 2024-06-01 DIAGNOSIS — I2510 Atherosclerotic heart disease of native coronary artery without angina pectoris: Secondary | ICD-10-CM

## 2024-06-01 DIAGNOSIS — I872 Venous insufficiency (chronic) (peripheral): Secondary | ICD-10-CM

## 2024-06-03 MED ORDER — FUROSEMIDE 40 MG PO TABS: 40.0000 mg | ORAL_TABLET | Freq: Every day | ORAL | 0 refills | Status: DC

## 2024-06-07 ENCOUNTER — Encounter: Payer: Self-pay | Admitting: *Deleted

## 2024-07-12 ENCOUNTER — Encounter (HOSPITAL_BASED_OUTPATIENT_CLINIC_OR_DEPARTMENT_OTHER): Payer: Self-pay | Admitting: Cardiology

## 2024-07-12 ENCOUNTER — Ambulatory Visit (INDEPENDENT_AMBULATORY_CARE_PROVIDER_SITE_OTHER): Admitting: Cardiology

## 2024-07-12 VITALS — BP 118/72 | HR 75 | Ht 70.0 in | Wt 183.2 lb

## 2024-07-12 DIAGNOSIS — I422 Other hypertrophic cardiomyopathy: Secondary | ICD-10-CM

## 2024-07-12 DIAGNOSIS — R600 Localized edema: Secondary | ICD-10-CM

## 2024-07-12 DIAGNOSIS — I2510 Atherosclerotic heart disease of native coronary artery without angina pectoris: Secondary | ICD-10-CM

## 2024-07-12 DIAGNOSIS — I10 Essential (primary) hypertension: Secondary | ICD-10-CM | POA: Diagnosis not present

## 2024-07-12 DIAGNOSIS — I872 Venous insufficiency (chronic) (peripheral): Secondary | ICD-10-CM

## 2024-07-12 DIAGNOSIS — E7800 Pure hypercholesterolemia, unspecified: Secondary | ICD-10-CM

## 2024-07-12 MED ORDER — FUROSEMIDE 40 MG PO TABS: 40.0000 mg | ORAL_TABLET | Freq: Every day | ORAL | 11 refills | Status: AC | PRN

## 2024-07-12 MED ORDER — ATORVASTATIN CALCIUM 40 MG PO TABS: 40.0000 mg | ORAL_TABLET | Freq: Every day | ORAL | 3 refills | Status: AC

## 2024-07-12 MED ORDER — VALSARTAN 80 MG PO TABS: 80.0000 mg | ORAL_TABLET | Freq: Every day | ORAL | 3 refills | Status: AC

## 2024-07-12 NOTE — Patient Instructions (Signed)
 Medication Instructions:   Your physician recommends that you continue on your current medications as directed. Please refer to the Current Medication list given to you today.  *If you need a refill on your cardiac medications before your next appointment, please call your pharmacy*    Follow-Up:  Your next appointment:   6 month(s)  Provider:   Shelda Bruckner, MD, Rosaline Bane, NP, or Reche Finder, NP    Try changing the furosemide  (lasix , fluid pill) to just as needed for swelling instead of every day. Taking it daily can make you prone to dehydration, and we want to avoid that. If you notice that you are having blood pressures more than 130/80 routinely or having significant worsening swelling, let me know.              Try changing the furosemide  (lasix , fluid pill) to just as needed for swelling instead of every day. Taking it daily can make you prone to dehydration, and we want to avoid that. If you notice that you are having blood pressures more than 130/80 routinely or having significant worsening swelling, let me know.

## 2024-07-12 NOTE — Progress Notes (Signed)
 " Cardiology Office Note:  .    Date:  07/12/2024  ID:  Erik Leon, DOB [birth year], MRN [id number] PCP: Randol Dawes, MD  [facility] Providers Cardiologist:  Shelda Bruckner, MD     History of Present Illness: .    Erik Leon is a 74 y.o. male with a hx of hypertension, prior obesity and mild HCM who is seen for follow up. He was initially seen as a new consult at the request of Dr. Randol for evaluation of chest pain 02/01/18.   Cardiac history:   Chest pain 2019 - intermittent. No aggravating factors. Last 10 seconds. If he exerts himself, feels that his pulse goes faster, but doesn't get discomfort. Started several years ago. Happens once every couple months. Last occurrence two weeks ago, occurred while sitting at his desk. Feels like when you swallow something and it goes down the wrong pipe. Feels like it is under his heart. No radiation, no associated symptoms. He reports multiple prior stress tests, though I do not have the records. Noted as stress in Epic in 2013, but only normal echo results attached.   FH: no known CAD. Brother with congenital cardiac defect (below), may have passed from SCD.   HCM: echo in 2013 pdf reports SAM, though I cannot see additional details. Noted as mild LVH, I do not have measurements or gradients. Echo here in 2017 noted septal thickness as 1.5 cm and posterior wall thickness as 1.37 cm. No gradient noted.   Obesity: peak weight 252 lbs, tolerating compounded tirzepatide with significant weight loss.  Today: Weight 183 lbs here, was 174 lbs at home. Doing tirzepatide 75 units (compounded) weekly, tolerable GI side effects. Lowest weight was 164 lbs about 3 mos ago. Doing stationary bike 35-40 min every AM, discussed strength training to maintain/gain muscle mass. Does stretch bands, chair exercises. Had some lightheadedness in June, started electrolytes, discussed hydration.   Checks BP daily at home, runs 100s/70s. No significant  LE edema, takes lasix  daily. Wears compression stockings, but even without them he has rare swelling. Goes away with leg elevation.   In 05/2023, had an episode that lasted intermittently for a few days when he felt like his heart was skipping. Reviewed his Kardia tracings, one in particular concerning for afib. He states that the longest episode lasted about 5 minutes. He has not had recurrent symptoms since then. Discussed that if he does, would get monitor to evaluate for possible afib/burden.  ROS:  Denies chest pain, shortness of breath at rest or with normal exertion. No PND, orthopnea, LE edema or unexpected weight gain. No syncope. ROS otherwise negative except as noted.   Studies Reviewed: SABRA         Physical Exam:    VS:  BP 118/72   Pulse 75   Ht 5' 10 (1.778 m)   Wt 183 lb 3.2 oz (83.1 kg)   SpO2 98%   BMI 26.29 kg/m    Wt Readings from Last 3 Encounters:  07/12/24 183 lb 3.2 oz (83.1 kg)  02/09/23 196 lb 4.8 oz (89 kg)  08/07/22 234 lb 3.2 oz (106.2 kg)    GEN: Well nourished, well developed in no acute distress HEENT: Normal, moist mucous membranes NECK: No JVD CARDIAC: regular rhythm, normal S1 and S2, no rubs or gallops. 1/6 systolic murmur. VASCULAR: Radial and DP pulses 2+ bilaterally. No carotid bruits RESPIRATORY:  Clear to auscultation without rales, wheezing or rhonchi  ABDOMEN: Soft, non-tender, non-distended  MUSCULOSKELETAL:  Ambulates independently SKIN: Warm and dry, trace ankle edema NEUROLOGIC:  Alert and oriented x 3. No focal neuro deficits noted. PSYCHIATRIC:  Normal affect   ASSESSMENT AND PLAN: .    Bilateral LE edema -improved -Avoid dehydration given HCM. Takes lasix  daily, with weight loss and low blood pressure, will cut this back to PRN. Given parameters to watch for, when to call me -we have discussed compression, elevation, salt avoidance   Nonobstructive CAD Hypercholesterolemia -coronary CT 02/2018 with Ca score 198, prox/mid LAD  plaque but no significant stenosis by FFR -stress echo ordered previously for chest pain, but symptoms improved -continue atorvastatin  -continue aspirin -labs per [facility] 04/14/24 shows Tchol 91, HDL 45, LDL , TG 63    HCM Murmur -Predominantly focal septal hypertrophy. Max LVOT gradient noted on imaging was 12 mmHg. -asymptomatic, no syncope, no SCD in family clearly related to HCM (though half brother had congenital heart condition and died age 75 while exercising, which is concerning).   Hypertension:  -goal <130/80, at goal today and has been decreasing with weight loss -continue valsartan . Cutting back furosemide  to PRN as above. If he has hypotension/lightheadedness, cut back valsartan    Obesity -peak home weight 252 lbs, nadir 164 lbs -agree with increasing weight bearing exercise to preserve/improve muscle mass -discussed signs/symptoms that would suggest we cut back GLP dose further.   Secondary prevention -recommend heart healthy/Mediterranean diet, with whole grains, fruits, vegetable, fish, lean meats, nuts, and olive oil. Limit salt. -recommend moderate walking, 3-5 times/week for 30-50 minutes each session. Aim for at least 150 minutes.week. Goal should be pace of 3 miles/hours, or walking 1.5 miles in 30 minutes -recommend avoidance of tobacco products. Avoid excess alcohol.  Dispo: Follow-up in 6 months, or sooner as needed.  Total time of encounter: I spent 41 minutes dedicated to the care of this patient on the date of this encounter to include pre-visit review of records, face-to-face time with the patient discussing conditions above, and clinical documentation with the electronic health record. We specifically spent time today discussing GLP, goals, weight bearing exercise, cutting back blood pressure medication, signs/symptoms to watch for   Signed, Shelda Bruckner, MD   "
